# Patient Record
Sex: Male | Born: 1965 | Race: White | Hispanic: No | Marital: Married | Smoking: Never smoker
Health system: Southern US, Community
[De-identification: ages and names within clinical notes are randomized; demographics above are authoritative.]

## PROBLEM LIST (undated history)

## (undated) DIAGNOSIS — I509 Heart failure, unspecified: Secondary | ICD-10-CM

## (undated) DIAGNOSIS — I420 Dilated cardiomyopathy: Secondary | ICD-10-CM

## (undated) DIAGNOSIS — I272 Pulmonary hypertension, unspecified: Secondary | ICD-10-CM

## (undated) HISTORY — PX: BARIATRIC SURGERY: SHX1103

## (undated) HISTORY — DX: Pulmonary hypertension, unspecified: I27.20

## (undated) HISTORY — DX: Dilated cardiomyopathy: I42.0

## (undated) HISTORY — PX: UMBILICAL HERNIA REPAIR: SHX196

---

## 2015-10-02 DIAGNOSIS — G4733 Obstructive sleep apnea (adult) (pediatric): Secondary | ICD-10-CM | POA: Diagnosis not present

## 2015-11-01 DIAGNOSIS — G4733 Obstructive sleep apnea (adult) (pediatric): Secondary | ICD-10-CM | POA: Diagnosis not present

## 2015-11-13 DIAGNOSIS — K909 Intestinal malabsorption, unspecified: Secondary | ICD-10-CM | POA: Diagnosis not present

## 2015-11-25 DIAGNOSIS — Z903 Acquired absence of stomach [part of]: Secondary | ICD-10-CM | POA: Diagnosis not present

## 2015-12-02 DIAGNOSIS — G4733 Obstructive sleep apnea (adult) (pediatric): Secondary | ICD-10-CM | POA: Diagnosis not present

## 2016-01-01 DIAGNOSIS — G4733 Obstructive sleep apnea (adult) (pediatric): Secondary | ICD-10-CM | POA: Diagnosis not present

## 2016-01-26 DIAGNOSIS — N183 Chronic kidney disease, stage 3 (moderate): Secondary | ICD-10-CM | POA: Diagnosis not present

## 2016-01-26 DIAGNOSIS — K219 Gastro-esophageal reflux disease without esophagitis: Secondary | ICD-10-CM | POA: Diagnosis not present

## 2016-01-26 DIAGNOSIS — E1165 Type 2 diabetes mellitus with hyperglycemia: Secondary | ICD-10-CM | POA: Diagnosis not present

## 2016-01-26 DIAGNOSIS — I10 Essential (primary) hypertension: Secondary | ICD-10-CM | POA: Diagnosis not present

## 2016-02-01 DIAGNOSIS — G4733 Obstructive sleep apnea (adult) (pediatric): Secondary | ICD-10-CM | POA: Diagnosis not present

## 2016-02-12 DIAGNOSIS — Z23 Encounter for immunization: Secondary | ICD-10-CM | POA: Diagnosis not present

## 2016-02-12 DIAGNOSIS — N183 Chronic kidney disease, stage 3 (moderate): Secondary | ICD-10-CM | POA: Diagnosis not present

## 2016-02-12 DIAGNOSIS — L210 Seborrhea capitis: Secondary | ICD-10-CM | POA: Diagnosis not present

## 2016-02-12 DIAGNOSIS — G4733 Obstructive sleep apnea (adult) (pediatric): Secondary | ICD-10-CM | POA: Diagnosis not present

## 2016-03-03 DIAGNOSIS — G4733 Obstructive sleep apnea (adult) (pediatric): Secondary | ICD-10-CM | POA: Diagnosis not present

## 2016-03-14 DIAGNOSIS — G4733 Obstructive sleep apnea (adult) (pediatric): Secondary | ICD-10-CM | POA: Diagnosis not present

## 2016-04-06 DIAGNOSIS — I10 Essential (primary) hypertension: Secondary | ICD-10-CM | POA: Diagnosis not present

## 2016-04-06 DIAGNOSIS — I872 Venous insufficiency (chronic) (peripheral): Secondary | ICD-10-CM | POA: Diagnosis not present

## 2016-04-06 DIAGNOSIS — Z7982 Long term (current) use of aspirin: Secondary | ICD-10-CM | POA: Diagnosis not present

## 2016-04-06 DIAGNOSIS — S81831A Puncture wound without foreign body, right lower leg, initial encounter: Secondary | ICD-10-CM | POA: Diagnosis not present

## 2016-04-06 DIAGNOSIS — Z79899 Other long term (current) drug therapy: Secondary | ICD-10-CM | POA: Diagnosis not present

## 2016-05-03 DIAGNOSIS — G4733 Obstructive sleep apnea (adult) (pediatric): Secondary | ICD-10-CM | POA: Diagnosis not present

## 2016-09-13 DIAGNOSIS — Z0000 Encounter for general adult medical examination without abnormal findings: Secondary | ICD-10-CM | POA: Diagnosis not present

## 2016-09-16 DIAGNOSIS — G4733 Obstructive sleep apnea (adult) (pediatric): Secondary | ICD-10-CM | POA: Diagnosis not present

## 2016-09-16 DIAGNOSIS — L210 Seborrhea capitis: Secondary | ICD-10-CM | POA: Diagnosis not present

## 2016-09-16 DIAGNOSIS — Z0001 Encounter for general adult medical examination with abnormal findings: Secondary | ICD-10-CM | POA: Diagnosis not present

## 2016-09-16 DIAGNOSIS — N183 Chronic kidney disease, stage 3 (moderate): Secondary | ICD-10-CM | POA: Diagnosis not present

## 2017-05-19 DIAGNOSIS — N183 Chronic kidney disease, stage 3 (moderate): Secondary | ICD-10-CM | POA: Diagnosis not present

## 2017-05-19 DIAGNOSIS — R300 Dysuria: Secondary | ICD-10-CM | POA: Diagnosis not present

## 2017-05-19 DIAGNOSIS — L210 Seborrhea capitis: Secondary | ICD-10-CM | POA: Diagnosis not present

## 2017-05-19 DIAGNOSIS — G4733 Obstructive sleep apnea (adult) (pediatric): Secondary | ICD-10-CM | POA: Diagnosis not present

## 2017-05-19 DIAGNOSIS — L219 Seborrheic dermatitis, unspecified: Secondary | ICD-10-CM | POA: Diagnosis not present

## 2017-05-19 DIAGNOSIS — R0602 Shortness of breath: Secondary | ICD-10-CM | POA: Diagnosis not present

## 2017-05-19 DIAGNOSIS — Z23 Encounter for immunization: Secondary | ICD-10-CM | POA: Diagnosis not present

## 2017-06-01 DIAGNOSIS — N200 Calculus of kidney: Secondary | ICD-10-CM | POA: Diagnosis not present

## 2017-06-01 DIAGNOSIS — N390 Urinary tract infection, site not specified: Secondary | ICD-10-CM | POA: Diagnosis not present

## 2017-06-12 DIAGNOSIS — R5383 Other fatigue: Secondary | ICD-10-CM | POA: Diagnosis not present

## 2017-06-12 DIAGNOSIS — G4733 Obstructive sleep apnea (adult) (pediatric): Secondary | ICD-10-CM | POA: Diagnosis not present

## 2017-06-12 DIAGNOSIS — J029 Acute pharyngitis, unspecified: Secondary | ICD-10-CM | POA: Diagnosis not present

## 2017-06-12 DIAGNOSIS — Z6841 Body Mass Index (BMI) 40.0 and over, adult: Secondary | ICD-10-CM | POA: Diagnosis not present

## 2017-06-12 DIAGNOSIS — R197 Diarrhea, unspecified: Secondary | ICD-10-CM | POA: Diagnosis not present

## 2017-06-28 DIAGNOSIS — I10 Essential (primary) hypertension: Secondary | ICD-10-CM | POA: Diagnosis not present

## 2017-06-28 DIAGNOSIS — Z6841 Body Mass Index (BMI) 40.0 and over, adult: Secondary | ICD-10-CM | POA: Diagnosis not present

## 2017-06-28 DIAGNOSIS — G4733 Obstructive sleep apnea (adult) (pediatric): Secondary | ICD-10-CM | POA: Diagnosis not present

## 2017-06-28 DIAGNOSIS — F339 Major depressive disorder, recurrent, unspecified: Secondary | ICD-10-CM | POA: Diagnosis not present

## 2017-06-28 DIAGNOSIS — R05 Cough: Secondary | ICD-10-CM | POA: Diagnosis not present

## 2017-06-28 DIAGNOSIS — I714 Abdominal aortic aneurysm, without rupture: Secondary | ICD-10-CM | POA: Diagnosis not present

## 2017-06-28 DIAGNOSIS — R109 Unspecified abdominal pain: Secondary | ICD-10-CM | POA: Diagnosis not present

## 2017-06-28 DIAGNOSIS — I272 Pulmonary hypertension, unspecified: Secondary | ICD-10-CM | POA: Diagnosis not present

## 2017-06-28 DIAGNOSIS — I5031 Acute diastolic (congestive) heart failure: Secondary | ICD-10-CM | POA: Diagnosis not present

## 2017-06-28 DIAGNOSIS — R0602 Shortness of breath: Secondary | ICD-10-CM | POA: Diagnosis not present

## 2017-06-28 DIAGNOSIS — R7989 Other specified abnormal findings of blood chemistry: Secondary | ICD-10-CM | POA: Diagnosis not present

## 2017-06-28 DIAGNOSIS — Z9884 Bariatric surgery status: Secondary | ICD-10-CM | POA: Diagnosis not present

## 2017-06-28 DIAGNOSIS — Z87440 Personal history of urinary (tract) infections: Secondary | ICD-10-CM | POA: Diagnosis not present

## 2017-06-28 DIAGNOSIS — I5041 Acute combined systolic (congestive) and diastolic (congestive) heart failure: Secondary | ICD-10-CM | POA: Diagnosis not present

## 2017-06-28 DIAGNOSIS — I130 Hypertensive heart and chronic kidney disease with heart failure and stage 1 through stage 4 chronic kidney disease, or unspecified chronic kidney disease: Secondary | ICD-10-CM | POA: Diagnosis not present

## 2017-06-28 DIAGNOSIS — Z79899 Other long term (current) drug therapy: Secondary | ICD-10-CM | POA: Diagnosis not present

## 2017-06-28 DIAGNOSIS — N183 Chronic kidney disease, stage 3 (moderate): Secondary | ICD-10-CM | POA: Diagnosis not present

## 2017-06-28 DIAGNOSIS — R748 Abnormal levels of other serum enzymes: Secondary | ICD-10-CM | POA: Diagnosis not present

## 2017-06-28 DIAGNOSIS — E876 Hypokalemia: Secondary | ICD-10-CM | POA: Diagnosis not present

## 2017-06-28 DIAGNOSIS — Z833 Family history of diabetes mellitus: Secondary | ICD-10-CM | POA: Diagnosis not present

## 2017-06-29 DIAGNOSIS — I5031 Acute diastolic (congestive) heart failure: Secondary | ICD-10-CM | POA: Diagnosis not present

## 2017-06-29 DIAGNOSIS — R0602 Shortness of breath: Secondary | ICD-10-CM | POA: Diagnosis not present

## 2017-06-30 DIAGNOSIS — I5031 Acute diastolic (congestive) heart failure: Secondary | ICD-10-CM | POA: Diagnosis not present

## 2017-07-01 DIAGNOSIS — I5031 Acute diastolic (congestive) heart failure: Secondary | ICD-10-CM | POA: Diagnosis not present

## 2017-07-03 DIAGNOSIS — E782 Mixed hyperlipidemia: Secondary | ICD-10-CM | POA: Diagnosis not present

## 2017-07-03 DIAGNOSIS — I10 Essential (primary) hypertension: Secondary | ICD-10-CM | POA: Diagnosis not present

## 2017-07-03 DIAGNOSIS — I5042 Chronic combined systolic (congestive) and diastolic (congestive) heart failure: Secondary | ICD-10-CM | POA: Diagnosis not present

## 2017-07-12 DIAGNOSIS — N183 Chronic kidney disease, stage 3 (moderate): Secondary | ICD-10-CM | POA: Diagnosis not present

## 2017-07-12 DIAGNOSIS — E1165 Type 2 diabetes mellitus with hyperglycemia: Secondary | ICD-10-CM | POA: Diagnosis not present

## 2017-07-14 DIAGNOSIS — G4761 Periodic limb movement disorder: Secondary | ICD-10-CM | POA: Diagnosis not present

## 2017-07-14 DIAGNOSIS — G4733 Obstructive sleep apnea (adult) (pediatric): Secondary | ICD-10-CM | POA: Diagnosis not present

## 2017-07-25 ENCOUNTER — Encounter: Payer: Self-pay | Admitting: *Deleted

## 2017-07-25 ENCOUNTER — Encounter: Payer: Self-pay | Admitting: Cardiology

## 2017-07-25 ENCOUNTER — Ambulatory Visit (INDEPENDENT_AMBULATORY_CARE_PROVIDER_SITE_OTHER): Payer: BLUE CROSS/BLUE SHIELD | Admitting: Cardiology

## 2017-07-25 VITALS — BP 116/82 | HR 79 | Ht 74.0 in | Wt 380.4 lb

## 2017-07-25 DIAGNOSIS — I5022 Chronic systolic (congestive) heart failure: Secondary | ICD-10-CM | POA: Diagnosis not present

## 2017-07-25 DIAGNOSIS — I272 Pulmonary hypertension, unspecified: Secondary | ICD-10-CM

## 2017-07-25 DIAGNOSIS — N183 Chronic kidney disease, stage 3 unspecified: Secondary | ICD-10-CM

## 2017-07-25 MED ORDER — SACUBITRIL-VALSARTAN 24-26 MG PO TABS: 1.0000 | ORAL_TABLET | Freq: Two times a day (BID) | ORAL | 3 refills | Status: DC

## 2017-07-25 NOTE — Patient Instructions (Signed)
Your physician recommends that you schedule a follow-up appointment in: 2-3 WEEKS WITH DR [facility]  Your physician has recommended you make the following change in your medication:   STOP LISINOPRIL   AFTER 48 HOURS START ENTRESTO 24/26 MG TWICE DAILY  Thank you for choosing [facility]!!

## 2017-07-25 NOTE — H&P (View-Only) (Signed)
Clinical Summary [name] is a 52 y.o.male seen as new consult, referred by Dr Reuel Boom for new diagnosis of systolic heart failure.   ..  1. Chronic systolic HF - Previously seen by Dr Molly Maduro 3 years ago prior to bariatric surgery. He reports an echo at that time that he believes was normal, was cleared for surgery.  - Jan 2019 admission to [facility] with CHF exacerbation. LVEF at that time 20-25% which appears to be new diagnosis at that time.  - admitted with weight up from 395 to 419, SOB, orthopnea.   - no recent SOB/DOE - home weights stable 370 lbs. Limiting sodium intake. Avoiding NSAIDs - strong family history of CAD: mother, uncle, multiple cousins. - recent viral illness around same time of diagnosis of his HF.    2. Pulmonary HTN - Jan 2019 echo with PASP 57, evidence of right sided enlargement and dysfunction - VQ scan during recent [facility] admission low probability.  - history of OSA on cpap. LIkely left sided cardiac component.   3. CKD 3 - followed by pcp - Cr during recent admission 2.8, patient reports labs since discharge.   4. OSA - uses CPAP   5. AAA - noted in discharge in summary, we do not have the details - appears patient had abdominal US done during admission    Past Medical History:  Diagnosis Date  . Dilated cardiomyopathy (HCC)   . Pulmonary hypertension (HCC)      Allergies not on file   Current Outpatient Medications  Medication Sig Dispense Refill  . aspirin 81 MG tablet Take by mouth.    . furosemide (LASIX) 40 MG tablet Take by mouth.    Marland Kitchen lisinopril (PRINIVIL,ZESTRIL) 20 MG tablet Take by mouth.    . metoprolol succinate (TOPROL XL) 25 MG 24 hr tablet Take by mouth.    . sertraline (ZOLOFT) 100 MG tablet Take by mouth.     No current facility-administered medications for this visit.         Allergies not on file    Family History  Problem Relation Age of Onset  . Hypertension Mother   . Lung cancer Mother   .  Heart failure Mother   . Diabetes type II Father         Social History Mr. Beedle reports that  has never smoked. he has never used smokeless tobacco. Mr. Jaco has no alcohol history on file.   Review of Systems CONSTITUTIONAL: No weight loss, fever, chills, weakness or fatigue.  HEENT: Eyes: No visual loss, blurred vision, double vision or yellow sclerae.No hearing loss, sneezing, congestion, runny nose or sore throat.  SKIN: No rash or itching.  CARDIOVASCULAR: per hpi RESPIRATORY: Nper hpi GASTROINTESTINAL: No anorexia, nausea, vomiting or diarrhea. No abdominal pain or blood.  GENITOURINARY: No burning on urination, no polyuria NEUROLOGICAL: No headache, dizziness, syncope, paralysis, ataxia, numbness or tingling in the extremities. No change in bowel or bladder control.  MUSCULOSKELETAL: No muscle, back pain, joint pain or stiffness.  LYMPHATICS: No enlarged nodes. No history of splenectomy.  PSYCHIATRIC: No history of depression or anxiety.  ENDOCRINOLOGIC: No reports of sweating, cold or heat intolerance. No polyuria or polydipsia.  Marland Kitchen   Physical Examination Vitals:   07/25/17 0826  BP: 116/82  Pulse: 79  SpO2: 98%   Vitals:   07/25/17 0826  Weight: (!) 380 lb 6.4 oz (172.5 kg)  Height: 6\' 2"  (1.88 m)    Gen: resting comfortably, no  acute distress HEENT: no scleral icterus, pupils equal round and reactive, no palptable cervical adenopathy,  CV: RRR, no m/r/g, no jvd Resp: Clear to auscultation bilaterally GI: abdomen is soft, non-tender, non-distended, normal bowel sounds, no hepatosplenomegaly MSK: extremities are warm, no edema.  Skin: warm, no rash Neuro:  no focal deficits Psych: appropriate affect    Assessment and Plan  1. Chronic systolic HF - new diagnosis last month - appears euvolemic today, no symptoms - we will d/c lisinopril, wait 48 hrs then start entresto 24/26mg  bid - will need ischemic evaluation at some point, renal function may  limit options. Request most recent labs from pcp to see where his function currently is  2. Pulmonary HTN - likely due to left sided cardiac disease - from recent admission VQ was negative. He does have history of OSA could play some role. Perhaps some obesity hypovent though this has not been formally diagnosed - ideally LHC/RHC pending renal function  3. CKD 3 - f/u post discharge labs from pcp - limited dosing of entresto, no aldactone for now.   F/u 2-3 weeks       Antoine Poche, M.D.

## 2017-07-25 NOTE — Progress Notes (Signed)
Clinical Summary [name] is a 52 y.o.male seen as new consult, referred by Dr Mitchell Jennings for new diagnosis of systolic heart failure.   ..  1. Chronic systolic HF - Previously seen by Dr Mitchell Maduro 3 years ago prior to bariatric surgery. He reports an echo at that time that he believes was normal, was cleared for surgery.  - Jan 2019 admission to [facility] with CHF exacerbation. LVEF at that time 20-25% which appears to be new diagnosis at that time.  - admitted with weight up from 395 to 419, SOB, orthopnea.   - no recent SOB/DOE - home weights stable 370 lbs. Limiting sodium intake. Avoiding NSAIDs - strong family history of CAD: mother, uncle, multiple cousins. - recent viral illness around same time of diagnosis of his HF.    2. Pulmonary HTN - Jan 2019 echo with PASP 57, evidence of right sided enlargement and dysfunction - VQ scan during recent [facility] admission low probability.  - history of OSA on cpap. LIkely left sided cardiac component.   3. CKD 3 - followed by pcp - Cr during recent admission 2.8, patient reports labs since discharge.   4. OSA - uses CPAP   5. AAA - noted in discharge in summary, we do not have the details - appears patient had abdominal US done during admission    Past Medical History:  Diagnosis Date  . Dilated cardiomyopathy (HCC)   . Pulmonary hypertension (HCC)      Allergies not on file   Current Outpatient Medications  Medication Sig Dispense Refill  . aspirin 81 MG tablet Take by mouth.    . furosemide (LASIX) 40 MG tablet Take by mouth.    Marland Kitchen lisinopril (PRINIVIL,ZESTRIL) 20 MG tablet Take by mouth.    . metoprolol succinate (TOPROL XL) 25 MG 24 hr tablet Take by mouth.    . sertraline (ZOLOFT) 100 MG tablet Take by mouth.     No current facility-administered medications for this visit.         Allergies not on file    Family History  Problem Relation Age of Onset  . Hypertension Mother   . Lung cancer Mother   .  Heart failure Mother   . Diabetes type II Father         Social History [name] reports that  has never smoked. he has never used smokeless tobacco. [name] has no alcohol history on file.   Review of Systems CONSTITUTIONAL: No weight loss, fever, chills, weakness or fatigue.  HEENT: Eyes: No visual loss, blurred vision, double vision or yellow sclerae.No hearing loss, sneezing, congestion, runny nose or sore throat.  SKIN: No rash or itching.  CARDIOVASCULAR: per hpi RESPIRATORY: Nper hpi GASTROINTESTINAL: No anorexia, nausea, vomiting or diarrhea. No abdominal pain or blood.  GENITOURINARY: No burning on urination, no polyuria NEUROLOGICAL: No headache, dizziness, syncope, paralysis, ataxia, numbness or tingling in the extremities. No change in bowel or bladder control.  MUSCULOSKELETAL: No muscle, back pain, joint pain or stiffness.  LYMPHATICS: No enlarged nodes. No history of splenectomy.  PSYCHIATRIC: No history of depression or anxiety.  ENDOCRINOLOGIC: No reports of sweating, cold or heat intolerance. No polyuria or polydipsia.  Marland Kitchen   Physical Examination Vitals:   07/25/17 0826  BP: 116/82  Pulse: 79  SpO2: 98%   Vitals:   07/25/17 0826  Weight: (!) 380 lb 6.4 oz (172.5 kg)  Height: 6\' 2"  (1.88 m)    Gen: resting comfortably, no  acute distress HEENT: no scleral icterus, pupils equal round and reactive, no palptable cervical adenopathy,  CV: RRR, no m/r/g, no jvd Resp: Clear to auscultation bilaterally GI: abdomen is soft, non-tender, non-distended, normal bowel sounds, no hepatosplenomegaly MSK: extremities are warm, no edema.  Skin: warm, no rash Neuro:  no focal deficits Psych: appropriate affect    Assessment and Plan  1. Chronic systolic HF - new diagnosis last month - appears euvolemic today, no symptoms - we will d/c lisinopril, wait 48 hrs then start entresto 24/26mg  bid - will need ischemic evaluation at some point, renal function may  limit options. Request most recent labs from pcp to see where his function currently is  2. Pulmonary HTN - likely due to left sided cardiac disease - from recent admission VQ was negative. He does have history of OSA could play some role. Perhaps some obesity hypovent though this has not been formally diagnosed - ideally LHC/RHC pending renal function  3. CKD 3 - f/u post discharge labs from pcp - limited dosing of entresto, no aldactone for now.   F/u 2-3 weeks       Antoine Poche, M.D.

## 2017-07-27 ENCOUNTER — Encounter: Payer: Self-pay | Admitting: *Deleted

## 2017-07-27 ENCOUNTER — Telehealth: Payer: Self-pay | Admitting: *Deleted

## 2017-07-27 NOTE — Telephone Encounter (Signed)
-----   Message from Antoine Poche, MD sent at 07/26/2017 12:38 PM EST ----- Can we let patient know that we received his recent labs from pcp and kidneys have improved to a level where we can do the cath we discussed. Please arrange a RHC/LHC for systolic heart failure   Dina Rich MD

## 2017-07-27 NOTE — Telephone Encounter (Signed)
L and R heart cath scheduled for 2/25 with Dr End @2pm  - pt voiced understanding of instructions - will also come by office tomorrow to pick up instruction letter- message sent to schedulers for pre cert

## 2017-07-28 ENCOUNTER — Telehealth: Payer: Self-pay | Admitting: Cardiology

## 2017-07-28 NOTE — Telephone Encounter (Signed)
Pre-cert Verification for the following procedure   L/R [facility] scheduled for Monday 2/25 with Dr End @ 2pm

## 2017-07-31 ENCOUNTER — Ambulatory Visit (HOSPITAL_COMMUNITY)
Admission: RE | Admit: 2017-07-31 | Discharge: 2017-07-31 | Disposition: A | Discharge status: Home / Self Care | Payer: BLUE CROSS/BLUE SHIELD | Source: Ambulatory Visit | Attending: Internal Medicine | Admitting: Internal Medicine

## 2017-07-31 ENCOUNTER — Encounter (HOSPITAL_COMMUNITY)
Admission: RE | Disposition: A | Discharge status: Home / Self Care | Payer: Self-pay | Source: Ambulatory Visit | Attending: Internal Medicine

## 2017-07-31 DIAGNOSIS — Z7982 Long term (current) use of aspirin: Secondary | ICD-10-CM | POA: Insufficient documentation

## 2017-07-31 DIAGNOSIS — I5020 Unspecified systolic (congestive) heart failure: Secondary | ICD-10-CM | POA: Diagnosis present

## 2017-07-31 DIAGNOSIS — N183 Chronic kidney disease, stage 3 (moderate): Secondary | ICD-10-CM | POA: Insufficient documentation

## 2017-07-31 DIAGNOSIS — Z6841 Body Mass Index (BMI) 40.0 and over, adult: Secondary | ICD-10-CM | POA: Diagnosis not present

## 2017-07-31 DIAGNOSIS — I714 Abdominal aortic aneurysm, without rupture: Secondary | ICD-10-CM | POA: Diagnosis not present

## 2017-07-31 DIAGNOSIS — I272 Pulmonary hypertension, unspecified: Secondary | ICD-10-CM | POA: Insufficient documentation

## 2017-07-31 DIAGNOSIS — I5022 Chronic systolic (congestive) heart failure: Secondary | ICD-10-CM | POA: Insufficient documentation

## 2017-07-31 DIAGNOSIS — I420 Dilated cardiomyopathy: Secondary | ICD-10-CM | POA: Insufficient documentation

## 2017-07-31 DIAGNOSIS — G4733 Obstructive sleep apnea (adult) (pediatric): Secondary | ICD-10-CM | POA: Diagnosis not present

## 2017-07-31 DIAGNOSIS — Z8249 Family history of ischemic heart disease and other diseases of the circulatory system: Secondary | ICD-10-CM | POA: Insufficient documentation

## 2017-07-31 HISTORY — PX: RIGHT/LEFT HEART CATH AND CORONARY ANGIOGRAPHY: CATH118266

## 2017-07-31 LAB — BASIC METABOLIC PANEL
ANION GAP: 8 (ref 5–15)
BUN: 19 mg/dL (ref 6–20)
CHLORIDE: 106 mmol/L (ref 101–111)
CO2: 27 mmol/L (ref 22–32)
Calcium: 8.6 mg/dL — ABNORMAL LOW (ref 8.9–10.3)
Creatinine, Ser: 1.55 mg/dL — ABNORMAL HIGH (ref 0.61–1.24)
GFR calc Af Amer: 59 mL/min — ABNORMAL LOW (ref >60)
GFR calc non Af Amer: 51 mL/min — ABNORMAL LOW (ref >60)
Glucose, Bld: 97 mg/dL (ref 65–99)
POTASSIUM: 3.7 mmol/L (ref 3.5–5.1)
Sodium: 141 mmol/L (ref 135–145)

## 2017-07-31 LAB — CBC
HCT: 48.4 % (ref 39.0–52.0)
HEMOGLOBIN: 15.7 g/dL (ref 13.0–17.0)
MCH: 28.9 pg (ref 26.0–34.0)
MCHC: 32.4 g/dL (ref 30.0–36.0)
MCV: 89 fL (ref 78.0–100.0)
PLATELETS: 167 10*3/uL (ref 150–400)
RBC: 5.44 MIL/uL (ref 4.22–5.81)
RDW: 14.9 % (ref 11.5–15.5)
WBC: 5.8 10*3/uL (ref 4.0–10.5)

## 2017-07-31 LAB — PROTIME-INR
INR: 1.06
PROTHROMBIN TIME: 13.7 s (ref 11.4–15.2)

## 2017-07-31 LAB — POCT I-STAT 3, VENOUS BLOOD GAS (G3P V)
ACID-BASE EXCESS: 1 mmol/L (ref 0.0–2.0)
Acid-Base Excess: 1 mmol/L (ref 0.0–2.0)
BICARBONATE: 26.7 mmol/L (ref 20.0–28.0)
BICARBONATE: 26.9 mmol/L (ref 20.0–28.0)
O2 SAT: 61 %
O2 SAT: 62 %
PCO2 VEN: 47.3 mmHg (ref 44.0–60.0)
PCO2 VEN: 47.7 mmHg (ref 44.0–60.0)
PO2 VEN: 34 mmHg (ref 32.0–45.0)
PO2 VEN: 34 mmHg (ref 32.0–45.0)
TCO2: 28 mmol/L (ref 22–32)
TCO2: 28 mmol/L (ref 22–32)
pH, Ven: 7.359 (ref 7.250–7.430)
pH, Ven: 7.359 (ref 7.250–7.430)

## 2017-07-31 LAB — POCT I-STAT 3, ART BLOOD GAS (G3+)
BICARBONATE: 24.5 mmol/L (ref 20.0–28.0)
O2 SAT: 97 %
TCO2: 26 mmol/L (ref 22–32)
pCO2 arterial: 39.6 mmHg (ref 32.0–48.0)
pH, Arterial: 7.399 (ref 7.350–7.450)
pO2, Arterial: 95 mmHg (ref 83.0–108.0)

## 2017-07-31 SURGERY — RIGHT/LEFT HEART CATH AND CORONARY ANGIOGRAPHY
Anesthesia: LOCAL

## 2017-07-31 MED ORDER — HEPARIN (PORCINE) IN NACL 2-0.9 UNIT/ML-% IJ SOLN
INTRAMUSCULAR | Status: AC | PRN
  Administered 2017-07-31 (×2): 500 mL

## 2017-07-31 MED ORDER — LIDOCAINE HCL (PF) 1 % IJ SOLN
INTRAMUSCULAR | Status: DC | PRN
  Administered 2017-07-31 (×2): 2 mL

## 2017-07-31 MED ORDER — HEPARIN SODIUM (PORCINE) 1000 UNIT/ML IJ SOLN
INTRAMUSCULAR | Status: AC
  Filled 2017-07-31: qty 1

## 2017-07-31 MED ORDER — VERAPAMIL HCL 2.5 MG/ML IV SOLN
INTRAVENOUS | Status: AC
  Filled 2017-07-31: qty 2

## 2017-07-31 MED ORDER — SODIUM CHLORIDE 0.9 % IV SOLN: 250.0000 mL | INTRAVENOUS | Status: DC | PRN

## 2017-07-31 MED ORDER — SODIUM CHLORIDE 0.9% FLUSH: 3.0000 mL | INTRAVENOUS | Status: DC | PRN

## 2017-07-31 MED ORDER — SODIUM CHLORIDE 0.9 % WEIGHT BASED INFUSION: 1.0000 mL/kg/h | INTRAVENOUS | Status: DC

## 2017-07-31 MED ORDER — SODIUM CHLORIDE 0.9% FLUSH: 3.0000 mL | Freq: Two times a day (BID) | INTRAVENOUS | Status: DC

## 2017-07-31 MED ORDER — SODIUM CHLORIDE 0.9 % WEIGHT BASED INFUSION
3.0000 mL/kg/h | INTRAVENOUS | Status: AC
  Administered 2017-07-31: 3 mL/kg/h via INTRAVENOUS

## 2017-07-31 MED ORDER — ASPIRIN 81 MG PO CHEW: 81.0000 mg | CHEWABLE_TABLET | ORAL | Status: DC

## 2017-07-31 MED ORDER — FUROSEMIDE 40 MG PO TABS: 80.0000 mg | ORAL_TABLET | Freq: Two times a day (BID) | ORAL | Status: DC

## 2017-07-31 MED ORDER — HEPARIN (PORCINE) IN NACL 2-0.9 UNIT/ML-% IJ SOLN
INTRAMUSCULAR | Status: AC
  Filled 2017-07-31: qty 1000

## 2017-07-31 MED ORDER — HEPARIN SODIUM (PORCINE) 1000 UNIT/ML IJ SOLN
INTRAMUSCULAR | Status: DC | PRN
  Administered 2017-07-31: 5000 [IU] via INTRAVENOUS

## 2017-07-31 MED ORDER — IOPAMIDOL (ISOVUE-370) INJECTION 76%
INTRAVENOUS | Status: AC
  Filled 2017-07-31: qty 100

## 2017-07-31 MED ORDER — LIDOCAINE HCL 1 % IJ SOLN
INTRAMUSCULAR | Status: AC
  Filled 2017-07-31: qty 20

## 2017-07-31 MED ORDER — IOPAMIDOL (ISOVUE-370) INJECTION 76%
INTRAVENOUS | Status: DC | PRN
  Administered 2017-07-31: 50 mL via INTRAVENOUS

## 2017-07-31 SURGICAL SUPPLY — 14 items
CATH BALLN WEDGE 5F 110CM (CATHETERS) ×2 IMPLANT
CATH IMPULSE 5F ANG/FL3.5 (CATHETERS) ×2 IMPLANT
COVER PRB 48X5XTLSCP FOLD TPE (BAG) ×1 IMPLANT
COVER PROBE 5X48 (BAG) ×1
DEVICE RAD COMP TR BAND LRG (VASCULAR PRODUCTS) ×2 IMPLANT
GLIDESHEATH SLEND SS 6F .021 (SHEATH) ×2 IMPLANT
GUIDEWIRE INQWIRE 1.5J.035X260 (WIRE) ×1 IMPLANT
HOVERMATT SINGLE USE (MISCELLANEOUS) ×2 IMPLANT
INQWIRE 1.5J .035X260CM (WIRE) ×2
KIT HEART LEFT (KITS) ×2 IMPLANT
PACK CARDIAC CATHETERIZATION (CUSTOM PROCEDURE TRAY) ×2 IMPLANT
SHEATH GLIDE SLENDER 4/5FR (SHEATH) ×2 IMPLANT
TRANSDUCER W/STOPCOCK (MISCELLANEOUS) ×2 IMPLANT
TUBING CIL FLEX 10 FLL-RA (TUBING) ×2 IMPLANT

## 2017-07-31 NOTE — Interval H&P Note (Signed)
History and Physical Interval Note:  07/31/2017 1:05 PM  Mitchell Jennings Mitchell Jennings.  has presented today for cardiac catheterization, with the diagnosis of systolic heart failure.  The various methods of treatment have been discussed with the patient and family. After consideration of risks, benefits and other options for treatment, the patient has consented to  Procedure(s): RIGHT/LEFT HEART CATH AND CORONARY ANGIOGRAPHY (N/A) as a surgical intervention .  The patient's history has been reviewed, patient examined, no change in status, stable for surgery.  I have reviewed the patient's chart and labs.  Questions were answered to the patient's satisfaction.    Cath Lab Visit (complete for each Cath Lab visit)  Clinical Evaluation Leading to the Procedure:   ACS: No.  Non-ACS:    Anginal Classification: No Symptoms (NYHA class III heart failure)  Anti-ischemic medical therapy: Minimal Therapy (1 class of medications)  Non-Invasive Test Results: No non-invasive testing performed (severely reduced LVEF by echo)  Prior CABG: No previous CABG  Mitchell Jennings

## 2017-07-31 NOTE — Brief Op Note (Signed)
BRIEF CARDIAC CATHETERIZATION NOTE  DATE: 07/31/2017 TIME: 2:06 PM  PATIENT:  Mitchell Jennings.  52 y.o. male  PRE-OPERATIVE DIAGNOSIS:  Chronic systolic heart failure  POST-OPERATIVE DIAGNOSIS:  Same  PROCEDURE:  Procedure(s): RIGHT/LEFT HEART CATH AND CORONARY ANGIOGRAPHY (N/A)  SURGEON:  Surgeon(s) and Role:    Yvonne Kendall, MD - Primary  FINDING: 1. No angiographically significant coronary artery disease. 2. Moderately to severely elevated left heart filling pressures. 3. Mildly to moderately elevated right heart filling pressures. 4. Severe pulmonary hypertension. 5. Low Fick cardiac output/index. 6. Small right radial artery; right ulnar access utilized for the procedure.  RECOMMENDATIONS: 1. Increase furosemide to 80 mg BID; follow-up labs with Dr. Wyline Mood in ~1 week. 2. Optimize evidence based heart failure therapy for non-ischemic cardiomyopathy.  Yvonne Kendall, MD [facility] Pager: [phone]

## 2017-07-31 NOTE — Progress Notes (Signed)
Right brachial sheath removed without difficulty removed intact and no bleeding or hematoma

## 2017-07-31 NOTE — Discharge Instructions (Signed)
Radial Site Care Refer to this sheet in the next few weeks. These instructions provide you with information about caring for yourself after your procedure. Your health care provider may also give you more specific instructions. Your treatment has been planned according to current medical practices, but problems sometimes occur. Call your health care provider if you have any problems or questions after your procedure. What can I expect after the procedure? After your procedure, it is typical to have the following:  Bruising at the radial site that usually fades within 1-2 weeks.  Blood collecting in the tissue (hematoma) that may be painful to the touch. It should usually decrease in size and tenderness within 1-2 weeks.  Follow these instructions at home:  Take medicines only as directed by your health care provider.  You may shower 24-48 hours after the procedure or as directed by your health care provider. Remove the bandage (dressing) and gently wash the site with plain soap and water. Pat the area dry with a clean towel. Do not rub the site, because this may cause bleeding.  Do not take baths, swim, or use a hot tub until your health care provider approves.  Check your insertion site every day for redness, swelling, or drainage.  Do not apply powder or lotion to the site.  Do not flex or bend the affected arm for 24 hours or as directed by your health care provider.  Do not push or pull heavy objects with the affected arm for 24 hours or as directed by your health care provider.  Do not lift over 10 lb (4.5 kg) for 5 days after your procedure or as directed by your health care provider.  Ask your health care provider when it is okay to: ? Return to work or school. ? Resume usual physical activities or sports. ? Resume sexual activity.  Do not drive home if you are discharged the same day as the procedure. Have someone else drive you.  You may drive 24 hours after the procedure  unless otherwise instructed by your health care provider.  Do not operate machinery or power tools for 24 hours after the procedure.  If your procedure was done as an outpatient procedure, which means that you went home the same day as your procedure, a responsible adult should be with you for the first 24 hours after you arrive home.  Keep all follow-up visits as directed by your health care provider. This is important. Contact a health care provider if:  You have a fever.  You have chills.  You have increased bleeding from the radial site. Hold pressure on the site. Get help right away if:  You have unusual pain at the radial site.  You have redness, warmth, or swelling at the radial site.  You have drainage (other than a small amount of blood on the dressing) from the radial site.  The radial site is bleeding, and the bleeding does not stop after 30 minutes of holding steady pressure on the site.  Your arm or hand becomes pale, cool, tingly, or numb. This information is not intended to replace advice given to you by your health care provider. Make sure you discuss any questions you have with your health care provider. Document Released: 06/25/2010 Document Revised: 10/29/2015 Document Reviewed: 12/09/2013 Elsevier Interactive Patient Education  2018 [employer]. Excuse from Work, [employer], or Physical Activity ______________________Charles Hopper_________________________________ needs to be excused from: __x__ Work ____ [employer] ____ Physical activity beginning now and through the  following date: _3/4/2019_______________. He or she may return to work or school but should still avoid the following physical activity or activities from now until ________________. Activity restrictions include: ___ Lifting more than ______ lb ____ Sitting longer than __________ minutes at a time ____ Standing longer than ________ minutes at a time ____ He or she may return to full physical  activity as of ________________. Health Care Provider Name (printed): __Dr Cristal Deer End______________________________________ [facility] Provider (signature): ___________________________________________ Date: ____2/25/2019____________   This information is not intended to replace advice given to you by your health care provider. Make sure you discuss any questions you have with your health care provider. Document Released: 11/16/2000 Document Revised: 05/06/2016 Document Reviewed: 12/23/2013 Elsevier Interactive Patient Education  [employer].

## 2017-08-01 ENCOUNTER — Encounter (HOSPITAL_COMMUNITY): Payer: Self-pay | Admitting: Internal Medicine

## 2017-08-01 MED FILL — Verapamil HCl IV Soln 2.5 MG/ML: INTRAVENOUS | Qty: 2 | Status: AC

## 2017-08-01 MED FILL — Heparin Sodium (Porcine) 2 Unit/ML in Sodium Chloride 0.9%: INTRAMUSCULAR | Qty: 1000 | Status: AC

## 2017-08-01 MED FILL — Lidocaine HCl Local Inj 1%: INTRAMUSCULAR | Qty: 20 | Status: AC

## 2017-08-02 ENCOUNTER — Telehealth: Payer: Self-pay | Admitting: *Deleted

## 2017-08-02 DIAGNOSIS — I5022 Chronic systolic (congestive) heart failure: Secondary | ICD-10-CM

## 2017-08-02 NOTE — Telephone Encounter (Signed)
Mitchell Poche, MD  Mitchell Jennings, Mitchell Jennings, CMA        Can we get a BMET/Mg on Friday for this patient and also refer to CHF clinic for chronic systolic HF please.    Dina Rich MD    Pt aware and will come tomorrow to pick up lab orders and referral for CHF clinic. Says he will arrange to have labs done at [facility] or [facility] on Friday

## 2017-08-03 ENCOUNTER — Other Ambulatory Visit: Payer: Self-pay | Admitting: *Deleted

## 2017-08-03 DIAGNOSIS — I5022 Chronic systolic (congestive) heart failure: Secondary | ICD-10-CM

## 2017-08-04 DIAGNOSIS — I5022 Chronic systolic (congestive) heart failure: Secondary | ICD-10-CM | POA: Diagnosis not present

## 2017-08-09 ENCOUNTER — Encounter: Payer: Self-pay | Admitting: *Deleted

## 2017-08-09 ENCOUNTER — Other Ambulatory Visit: Payer: Self-pay | Admitting: *Deleted

## 2017-08-09 ENCOUNTER — Telehealth (HOSPITAL_COMMUNITY): Payer: Self-pay | Admitting: Vascular Surgery

## 2017-08-09 DIAGNOSIS — I5022 Chronic systolic (congestive) heart failure: Secondary | ICD-10-CM

## 2017-08-09 NOTE — Telephone Encounter (Signed)
Left pt message to make next ava new pt appt for CHF

## 2017-08-15 ENCOUNTER — Encounter: Payer: Self-pay | Admitting: Cardiology

## 2017-08-15 ENCOUNTER — Ambulatory Visit (INDEPENDENT_AMBULATORY_CARE_PROVIDER_SITE_OTHER): Payer: BLUE CROSS/BLUE SHIELD | Admitting: Cardiology

## 2017-08-15 VITALS — BP 121/88 | HR 79 | Ht 74.0 in | Wt 381.0 lb

## 2017-08-15 DIAGNOSIS — I5022 Chronic systolic (congestive) heart failure: Secondary | ICD-10-CM

## 2017-08-15 DIAGNOSIS — N183 Chronic kidney disease, stage 3 unspecified: Secondary | ICD-10-CM

## 2017-08-15 DIAGNOSIS — G4733 Obstructive sleep apnea (adult) (pediatric): Secondary | ICD-10-CM | POA: Diagnosis not present

## 2017-08-15 DIAGNOSIS — I272 Pulmonary hypertension, unspecified: Secondary | ICD-10-CM | POA: Diagnosis not present

## 2017-08-15 MED ORDER — METOPROLOL SUCCINATE ER 25 MG PO TB24: 25.0000 mg | ORAL_TABLET | Freq: Every day | ORAL | 3 refills | Status: DC

## 2017-08-15 MED ORDER — TORSEMIDE 20 MG PO TABS: 40.0000 mg | ORAL_TABLET | Freq: Two times a day (BID) | ORAL | 3 refills | Status: DC

## 2017-08-15 NOTE — Patient Instructions (Addendum)
Medication Instructions:  STOP LASIX  START TORSEMIDE 40 MG - TWO TIMES DAILY  INCREASE METOPROLOL TO 25 MG DAILY    Labwork: 1 WEEK BMET TSH MAGNESIUM  Testing/Procedures: NONE  Follow-Up: Your physician recommends that you schedule a follow-up appointment : AS NEEDED   Any Other Special Instructions Will Be Listed Below (If Applicable). You have been referred to CARDIAC REHAB       If you need a refill on your cardiac medications before your next appointment, please call your pharmacy.

## 2017-08-15 NOTE — Progress Notes (Signed)
Clinical Summary [name] is a 52 y.o.male seen today for follow up of the followin gmedical problems.   1. Chronic systolic HF - Previously seen by Dr Mitchell Jennings 3 years ago prior to bariatric surgery. He reports an echo at that time that he believes was normal, was cleared for surgery.  - Jan 2019 admission to [facility] with CHF exacerbation. LVEF at that time 20-25% which appears to be new diagnosis.  - admitted with weight up from 395 to 419, SOB, orthopnea. Diuresed with improved symptoms - reports viral illness around same time of diagnosis of his HF.    - cath 07/2017 no significant CAD. CI 1.73, Mean PA 51, PW mean 30, LVEDP 30 - after cath lasix increased to 80mg  bid - we also referred to CHF clinic due to his severe CHF, low CI, and young age. - clinic weights stable at 381 despite increased lasix.  - home weights 367-370 lbs.     2. Pulmonary HTN - Jan 2019 echo with PASP 57, evidence of right sided enlargement and dysfunction - 07/2017 RHC mean PA 51, PCWP 31, mixed precap and postcap pulm HTN.  - VQ scan during recent [facility] admission low probability.  - history of OSA on cpap.  - probable left sided heart diseese and OSA as etiology.   - no recent symptoms  3. CKD 3 - followed by pcp - AKI during prior admission Cr up to 2.8, has trended down.   4. OSA - uses CPAP  - followed by Dr Mitchell Jennings  Reports recent sleep study at [facility]. We have requested results.    5. AAA - Jan 2019 [facility] Abd [place]: 3.4 cm AAA - no symptoms     Past Medical History:  Diagnosis Date  . Dilated cardiomyopathy (HCC)   . Pulmonary hypertension (HCC)      No Known Allergies   Current Outpatient Medications  Medication Sig Dispense Refill  . furosemide (LASIX) 40 MG tablet Take 2 tablets (80 mg total) by mouth 2 (two) times daily. 30 tablet   . metoprolol succinate (TOPROL XL) 25 MG 24 hr tablet Take 12.5 mg by mouth daily.     . Multiple Vitamins-Minerals (CENTRUM  MEN) TABS Take 1 tablet by mouth daily.    . sacubitril-valsartan (ENTRESTO) 24-26 MG Take 1 tablet by mouth 2 (two) times daily. 60 tablet 3   No current facility-administered medications for this visit.      Past Surgical History:  Procedure Laterality Date  . BARIATRIC SURGERY    . RIGHT/LEFT HEART CATH AND CORONARY ANGIOGRAPHY N/A 07/31/2017   Procedure: RIGHT/LEFT HEART CATH AND CORONARY ANGIOGRAPHY;  Surgeon: Mitchell Kendall, MD;  Location: [facility] INVASIVE CV LAB;  Service: Cardiovascular;  Laterality: N/A;  . UMBILICAL HERNIA REPAIR       No Known Allergies    Family History  Problem Relation Age of Onset  . Hypertension Mother   . Lung cancer Mother   . Heart failure Mother   . Diabetes type II Father      Social History [name] reports that  has never smoked. he has never used smokeless tobacco. [name] has no alcohol history on file.   Review of Systems CONSTITUTIONAL: No weight loss, fever, chills, weakness or fatigue.  HEENT: Eyes: No visual loss, blurred vision, double vision or yellow sclerae.No hearing loss, sneezing, congestion, runny nose or sore throat.  SKIN: No rash or itching.  CARDIOVASCULAR: per hpi RESPIRATORY: No shortness of  breath, cough or sputum.  GASTROINTESTINAL: No anorexia, nausea, vomiting or diarrhea. No abdominal pain or blood.  GENITOURINARY: No burning on urination, no polyuria NEUROLOGICAL: No headache, dizziness, syncope, paralysis, ataxia, numbness or tingling in the extremities. No change in bowel or bladder control.  MUSCULOSKELETAL: No muscle, back pain, joint pain or stiffness.  LYMPHATICS: No enlarged nodes. No history of splenectomy.  PSYCHIATRIC: No history of depression or anxiety.  ENDOCRINOLOGIC: No reports of sweating, cold or heat intolerance. No polyuria or polydipsia.  Marland Kitchen   Physical Examination Vitals:   08/15/17 1347  BP: 121/88  Pulse: 79  SpO2: 98%   Vitals:   08/15/17 1347  Weight: (!) 381 lb (172.8  kg)  Height: 6\' 2"  (1.88 m)    Gen: resting comfortably, no acute distress HEENT: no scleral icterus, pupils equal round and reactive, no palptable cervical adenopathy,  CV: RRR, no m/r/g, no jvd Resp: Clear to auscultation bilaterally GI: abdomen is soft, non-tender, non-distended, normal bowel sounds, no hepatosplenomegaly MSK: extremities are warm, no edema.  Skin: warm, no rash Neuro:  no focal deficits Psych: appropriate affect   Diagnostic Studies  07/2017 echo Conclusions: 1. No angiographically significant coronary artery disease, consistent with non-ischemic cardiomyopathy. 2. Moderately elevated right heart filling pressures. 3. Moderately to severely elevated left heart filling pressures. 4. Severe pulmonary hypertension. 5. Low Fick cardiac output/index.  Recommendations: 1. Increase furosemide to 80 mg BID with repeat BMP at the end of this week or early next week, given chronic kidney disease. 2. Close outpatient follow-up for optimization of evidence-based heart failure therapy.   Assessment and Plan   1. Chronic systolic HF/NICM - recent diagnosis. Cath shows no significant CAD. Did have CI of 1.7, high filling pressures PCWP 30 - careful titration of beta blocker in setting of low CI - lasix was increased after recent cath with elevated filling pressures however weights remains stable, does not appear to have had significant diuresis. Exam limited by body habitus.  - we will stop lasix, start torsemide 40mg  bid. Increase Toprol XL to 25mg  daily - check BMET/Mg/TSH in 1 week.  - refer to cardiac rehab in [place]  - due to his severe LV dysfunction, low CI, and young age patient will be referred to CHF clinic. He will f/u with [place] as needed or once discharged from CHF clinic.   2. Pulmonary HTN - mixed pre and post capillary hemodynamic profile by recent RHC - likely due to left sided cardiac disease, OSA. Suspect obesity hypovent though this has not been  formally diagnosed - conitnue management of left heart disease, continue CPAP.   3. CKD 3 - monitor renal function closely with diuresis   4. OSA - followed by pcp per his report, we have requestsed sleep study results. If severe may warrant establishing him with specialist in sleep medicine given his CHF and pulm HTN.         Antoine Poche, M.D

## 2017-08-18 ENCOUNTER — Encounter: Payer: Self-pay | Admitting: Cardiology

## 2017-08-22 DIAGNOSIS — I5022 Chronic systolic (congestive) heart failure: Secondary | ICD-10-CM | POA: Diagnosis not present

## 2017-08-22 DIAGNOSIS — Z9189 Other specified personal risk factors, not elsewhere classified: Secondary | ICD-10-CM | POA: Diagnosis not present

## 2017-08-22 DIAGNOSIS — K219 Gastro-esophageal reflux disease without esophagitis: Secondary | ICD-10-CM | POA: Diagnosis not present

## 2017-08-22 DIAGNOSIS — E782 Mixed hyperlipidemia: Secondary | ICD-10-CM | POA: Diagnosis not present

## 2017-08-22 DIAGNOSIS — I10 Essential (primary) hypertension: Secondary | ICD-10-CM | POA: Diagnosis not present

## 2017-08-22 DIAGNOSIS — E1165 Type 2 diabetes mellitus with hyperglycemia: Secondary | ICD-10-CM | POA: Diagnosis not present

## 2017-08-23 ENCOUNTER — Encounter (HOSPITAL_COMMUNITY): Payer: Self-pay | Admitting: Cardiology

## 2017-08-23 ENCOUNTER — Ambulatory Visit (HOSPITAL_COMMUNITY)
Admission: RE | Admit: 2017-08-23 | Discharge: 2017-08-23 | Disposition: A | Discharge status: Home / Self Care | Payer: BLUE CROSS/BLUE SHIELD | Source: Ambulatory Visit | Attending: Cardiology | Admitting: Cardiology

## 2017-08-23 VITALS — BP 97/74 | HR 76 | Ht 74.0 in | Wt 381.8 lb

## 2017-08-23 DIAGNOSIS — Z9884 Bariatric surgery status: Secondary | ICD-10-CM | POA: Insufficient documentation

## 2017-08-23 DIAGNOSIS — I714 Abdominal aortic aneurysm, without rupture: Secondary | ICD-10-CM | POA: Insufficient documentation

## 2017-08-23 DIAGNOSIS — N183 Chronic kidney disease, stage 3 unspecified: Secondary | ICD-10-CM

## 2017-08-23 DIAGNOSIS — E669 Obesity, unspecified: Secondary | ICD-10-CM | POA: Insufficient documentation

## 2017-08-23 DIAGNOSIS — Z6841 Body Mass Index (BMI) 40.0 and over, adult: Secondary | ICD-10-CM | POA: Diagnosis not present

## 2017-08-23 DIAGNOSIS — Z79899 Other long term (current) drug therapy: Secondary | ICD-10-CM | POA: Insufficient documentation

## 2017-08-23 DIAGNOSIS — I272 Pulmonary hypertension, unspecified: Secondary | ICD-10-CM | POA: Insufficient documentation

## 2017-08-23 DIAGNOSIS — I5022 Chronic systolic (congestive) heart failure: Secondary | ICD-10-CM | POA: Diagnosis not present

## 2017-08-23 DIAGNOSIS — G4733 Obstructive sleep apnea (adult) (pediatric): Secondary | ICD-10-CM | POA: Insufficient documentation

## 2017-08-23 DIAGNOSIS — I428 Other cardiomyopathies: Secondary | ICD-10-CM | POA: Insufficient documentation

## 2017-08-23 MED ORDER — SPIRONOLACTONE 25 MG PO TABS: 12.5000 mg | ORAL_TABLET | Freq: Every day | ORAL | 3 refills | Status: DC

## 2017-08-23 NOTE — Patient Instructions (Signed)
Start Spironolactone 12.5 mg (1/2 tab) every night   Labs drawn today (if we do not call you, then your lab work was stable)   Your physician has recommended that you have a cardiopulmonary stress test (CPX). CPX testing is a non-invasive measurement of heart and lung function. It replaces a traditional treadmill stress test. This type of test provides a tremendous amount of information that relates not only to your present condition but also for future outcomes. This test combines measurements of you ventilation, respiratory gas exchange in the lungs, electrocardiogram (EKG), blood pressure and physical response before, during, and following an exercise protocol.  Your physician recommends that you schedule a follow-up appointment in: 2 weeks with Fara Chute D   Your physician recommends that you schedule a follow-up appointment in: 6-8 weeks with Dr. Shirlee Latch

## 2017-08-23 NOTE — Progress Notes (Signed)
PCP: Dr. Reuel Boom Cardiology: Dr. Wyline Mood HF Cardiology: Dr. Shirlee Latch  52 yo with history of nonischemic cardiomyopathy was referred by Dr. Wyline Mood for evaluation of CHF.  Patient had no cardiac history prior to 1/19.  However, starting at about 11/18, he began to develop profound exertional dyspnea.  This happened after a flu-like illness.  This was gradually progressive, and he ended up at [facility] in 1/19.  Echo showed EF 20-25% with some RV failure.  He was diuresed extensively and sent to followup with Dr. Wyline Mood.  In 2/19, he had a right heart cath showing no significant coronary disease but elevated left and right-sided filling pressures and pulmonary venous hypertension were noted on RHC.  Cardiac index was low.    Recently, he says that he has been feeling much better.   He is urinating more with torsemide.  He can walk up 2 flights of steps now at work with only mild dyspnea.  No dyspnea walking on flat ground though he tires walking long distances.   Able to throw soft ball with daughter.  No chest pain, lightheadedness, orthopnea/PND.  Rare palpitations.   Labs (2/19): creatinine 1.55 Labs (3/19): TSH normal, K 3.6, creatinine 1.7  PMH: 1. Obesity: h/o bariatric surgery (gastric sleeve).  2. OSA: Uses CPAP.  3. CKD: stage 3.  4. AAA: Abdominal US in 3/19 showed 3.4 cm AAA.  5. Chronic systolic CHF: Nonischemic cardiomyopathy.  First noted in 1/19.  - Echo (1/19, [facility]): EF 20-25%, moderate to severe LV dilation, moderate to severe RV dilation with moderately decreased RV systolic function, mild to moderate MR, PASP 57 mmHg.  - RHC/LHC (1/19): No significant CAD.  Mean RA 12, PA 80/30 mean 47, mean PCWP 35, CI 1.7, PVR 2.4 WU.   FH: Father with MI, uncle with CABG, uncle with MI, mother with MI and CHF.   SH: Married, lives in [place], works for a trucking company, no smoking, no ETOH/drugs.   ROS: All systems reviewed and negative except as per HPI.   Current Outpatient  Medications  Medication Sig Dispense Refill  . metoprolol succinate (TOPROL XL) 25 MG 24 hr tablet Take 1 tablet (25 mg total) by mouth daily. 90 tablet 3  . Multiple Vitamins-Minerals (CENTRUM MEN) TABS Take 1 tablet by mouth daily.    . sacubitril-valsartan (ENTRESTO) 24-26 MG Take 1 tablet by mouth 2 (two) times daily. 60 tablet 3  . torsemide (DEMADEX) 20 MG tablet Take 2 tablets (40 mg total) by mouth 2 (two) times daily. 360 tablet 3  . spironolactone (ALDACTONE) 25 MG tablet Take 0.5 tablets (12.5 mg total) by mouth daily. 15 tablet 3   No current facility-administered medications for this encounter.    BP 97/74   Pulse 76   Ht 6\' 2"  (1.88 m)   Wt (!) 381 lb 12.8 oz (173.2 kg)   SpO2 100%   BMI 49.02 kg/m  General: NAD, obese.  Neck: Thick, no JVD, no thyromegaly or thyroid nodule.  Lungs: Clear to auscultation bilaterally with normal respiratory effort. CV: Nondisplaced PMI.  Heart regular S1/S2, no S3/S4, no murmur.  No peripheral edema.  No carotid bruit.  Normal pedal pulses.  Abdomen: Soft, nontender, no hepatosplenomegaly, no distention.  Skin: Intact without lesions or rashes.  Neurologic: Alert and oriented x 3.  Psych: Normal affect. Extremities: No clubbing or cyanosis.  HEENT: Normal.   1. Chronic systolic CHF: Nonischemic cardiomyopathy.  Development of cardiomyopathy seemed to have occurred after a viral illness,  possibly he had viral myocarditis.  Interestingly, he stopped taking his selenium vitamin for 1 year prior to 1/19 (supposed to take post-bariatric surgery).  He has now restarted it.  There is some data on selenium deficiency and cardiomyopathy.  RHC in 2/19 showed very high filling pressures and low cardiac output.  However, symptomatically today the patient seems to be doing quite well overall. NYHA class II symptoms.  He does not appear volume overloaded on exam.   - Continue Toprol XL 25 mg daily and Entresto 24/26 bid.  - BP soft (90s here, he says it  runs 110s systolic at home), so not a lot of room for medication titration.  I will, however, add on spironolactone 12.5 daily.  BMET in 10 days.  - He has restarted selenium. - I will arrange for CPX.  - Repeat echo in 7/19 for ICD consideration.  - ECG today.  - Probably will not be able to fit in MRI machine for cardiac MRI.  2. H/o bariatric surgery: Gastric sleeve.  Stopped selenium for 1 year.  ?if selenium deficiency could have contributed to CMP.  He has restarted it.  He needs to continue to work on weight loss.  3. OSA: Continue CPAP.  4. CKD: Stage 3.  Follow creatinine closely with medication titration.  5. Pulmonary hypertension: RHC showed pulmonary venous hypertension that will be treated by diuresis.  No indication for selective pulmonary vasodilators.   Followup in HF pharmacy clinic for medication titration in 2 wks.  See me in 6 wks.   Mitchell Jennings 08/24/2017

## 2017-08-24 ENCOUNTER — Telehealth: Payer: Self-pay | Admitting: *Deleted

## 2017-08-24 NOTE — Telephone Encounter (Signed)
Pt aware and voiced understanding - routed to pcp  

## 2017-08-24 NOTE — Telephone Encounter (Signed)
-----   Message from Antoine Poche, MD sent at 08/24/2017 12:49 PM EDT ----- Labs show kidney function remains mildly decreased but within his recent range. I see that Dr Jearld Pies ordered some labs within the next few days, we will see what those results show, for now no changes.  Dominga Ferry MD

## 2017-09-06 ENCOUNTER — Ambulatory Visit (HOSPITAL_COMMUNITY): Payer: BLUE CROSS/BLUE SHIELD

## 2017-09-06 ENCOUNTER — Ambulatory Visit (HOSPITAL_COMMUNITY)
Admission: RE | Admit: 2017-09-06 | Discharge: 2017-09-06 | Disposition: A | Discharge status: Home / Self Care | Payer: BLUE CROSS/BLUE SHIELD | Source: Ambulatory Visit | Attending: Internal Medicine | Admitting: Internal Medicine

## 2017-09-06 ENCOUNTER — Other Ambulatory Visit (HOSPITAL_COMMUNITY): Payer: Self-pay | Admitting: *Deleted

## 2017-09-06 VITALS — BP 112/86 | HR 68 | Wt 382.8 lb

## 2017-09-06 DIAGNOSIS — I429 Cardiomyopathy, unspecified: Secondary | ICD-10-CM | POA: Diagnosis not present

## 2017-09-06 DIAGNOSIS — G4733 Obstructive sleep apnea (adult) (pediatric): Secondary | ICD-10-CM | POA: Diagnosis not present

## 2017-09-06 DIAGNOSIS — Z9884 Bariatric surgery status: Secondary | ICD-10-CM | POA: Diagnosis not present

## 2017-09-06 DIAGNOSIS — N183 Chronic kidney disease, stage 3 (moderate): Secondary | ICD-10-CM | POA: Insufficient documentation

## 2017-09-06 DIAGNOSIS — I272 Pulmonary hypertension, unspecified: Secondary | ICD-10-CM | POA: Insufficient documentation

## 2017-09-06 DIAGNOSIS — I5022 Chronic systolic (congestive) heart failure: Secondary | ICD-10-CM | POA: Insufficient documentation

## 2017-09-06 LAB — BASIC METABOLIC PANEL
ANION GAP: 11 (ref 5–15)
BUN: 23 mg/dL — AB (ref 6–20)
CALCIUM: 8.7 mg/dL — AB (ref 8.9–10.3)
CO2: 28 mmol/L (ref 22–32)
Chloride: 101 mmol/L (ref 101–111)
Creatinine, Ser: 1.73 mg/dL — ABNORMAL HIGH (ref 0.61–1.24)
GFR calc Af Amer: 52 mL/min — ABNORMAL LOW (ref >60)
GFR calc non Af Amer: 45 mL/min — ABNORMAL LOW (ref >60)
GLUCOSE: 86 mg/dL (ref 65–99)
Potassium: 3.8 mmol/L (ref 3.5–5.1)
Sodium: 140 mmol/L (ref 135–145)

## 2017-09-06 MED ORDER — SACUBITRIL-VALSARTAN 49-51 MG PO TABS: 1.0000 | ORAL_TABLET | Freq: Two times a day (BID) | ORAL | 5 refills | Status: DC

## 2017-09-06 NOTE — Progress Notes (Signed)
HF MD: Loralie Champagne  HPI:  52 yo Caucasian M with history of nonischemic cardiomyopathy was referred by Dr. Harl Bowie for evaluation of CHF.  Patient had no cardiac history prior to 1/19.  However, starting at about 11/18, he began to develop profound exertional dyspnea.  This happened after a flu-like illness.  This was gradually progressive, and he ended up at [facility] in 1/19.  Echo showed EF 20-25% with some RV failure.  He was diuresed extensively and sent to followup with Dr. Harl Bowie.  In 2/19, he had a right heart cath showing no significant coronary disease but elevated left and right-sided filling pressures and pulmonary venous hypertension were noted on RHC.  Cardiac index was low.    Patient returns today for pharmacist-led HF medication titration. At his last HF clinic visit, he was started on spironolactone 12.5 mg daily. He has felt well since initiation of spironolactone. No dyspnea walking on flat ground though he tires walking long distances. No chest pain, lightheadedness, orthopnea/PND.      Marland Kitchen Shortness of breath/dyspnea on exertion? no  . Orthopnea/PND? no . Edema? no . Lightheadedness/dizziness? no . Daily weights at home? Yes - stable ~371-373 lb  . Blood pressure/heart rate monitoring at home? Yes - 95-110/75-80 mmHg . Following low-sodium/fluid-restricted diet? Yes   HF Medications: Metoprolol succinate 25 mg PO daily Entresto 24-26 mg PO BID Spironolactone 12.5 mg PO daily Torsemide 40 mg PO BID  Has the patient been experiencing any side effects to the medications prescribed?  no  Does the patient have any problems obtaining medications due to transportation or finances?   No - [employer] commercial (has met deductible so meds are no charge)  Understanding of regimen: good Understanding of indications: good Potential of compliance: good Patient understands to avoid NSAIDs. Patient understands to avoid decongestants.    Pertinent Lab Values: . 09/06/2017:  Serum creatinine 1.73 (1.5-1.7), BUN 23, Potassium 3.8, Sodium 140, Selenium IP  Vital Signs: . Weight: 382.81 lb (dry weight: 381 lb) . Blood pressure: 112/86 mmHg . Heart rate: 68 bpm    Assessment: 1. Chronicsystolic CHF (EF 69-62%), due to NICM. NYHA class IIsymptoms. - Volume status stable - Cautiously increase Entresto to 49-51 mg BID with recent soft BP - Continue Toprol XL 25 mg daily, spironolactone 12.5 mg daily and torsemide 40 mg BID - Repeat echo in 7/19 for ICD consideration.  - Probably will not be able to fit in MRI machine for cardiac MRI.  - Basic disease state pathophysiology, medication indication, mechanism and side effects reviewed at length with patient and he verbalized understanding 2. H/o bariatric surgery: Gastric sleeve.   - Stopped selenium for 1 year.  ?if selenium deficiency could have contributed to CMP - Selenium level today  - He needs to continue to work on weight loss.  3. OSA: Continue CPAP.  4. CKD: Stage 3.  Follow creatinine closely with medication titration.  - SCr remains at BL (1.5-1.7) 5. Pulmonary hypertension: RHC showed pulmonary venous hypertension that will be treated by diuresis.  No indication for selective pulmonary vasodilators.    Plan: 1) Medication changes: Based on clinical presentation, vital signs and recent labs will increase Entresto to 49-51 mg BID 2) Labs: BMET + Selenium today  3) Follow-up: Pharmacy visit on 09/21/17 and Dr. Aundra Dubin 10/18/17   Ruta Hinds. Velva Harman, PharmD, BCPS, CPP Clinical Pharmacist Pager: [phone] Phone: [phone] 09/06/2017 10:21 AM

## 2017-09-06 NOTE — Patient Instructions (Signed)
It was great to see you today!  Please INCREASE your Entresto to 49-51 mg BID.   Blood work today. We will call you with any changes.   You are scheduled with the pharmacist, Cicero Duck, again on 09/21/17 and with Dr. Shirlee Latch on 10/18/17.

## 2017-09-07 LAB — SELENIUM SERUM: Selenium: 144 ug/L (ref 91–198)

## 2017-09-09 ENCOUNTER — Telehealth: Payer: Self-pay | Admitting: Cardiology

## 2017-09-09 NOTE — Telephone Encounter (Signed)
Paged by answering service, returned call at given number. Avalon Zubia was seen on 4/3 by Elizabeth Palau and had his Entresto uptitrated at that time. He has had a total of 5 doses of the higher dose of entresto (49-51mg ) and has tolerated it well. Today he noted that he has felt somewhat more fatigued, and his wife noted that he has been more irritable. He denies lightheadedness, postural symptoms, weight gain or loss. No other new symptoms. His initial blood pressure was 92/68, but a recheck shortly after was 106/64. Per Erika's noted, his range of BP is typically 95-110/75-80. Mr. Beranek endorses this as his usual range.  Given that his blood pressure is within his normal range, he otherwise feels well, and he does not note lightheadedness, presyncope, vision changes, or orthostatic symptoms, recommended that he continue on this Entresto dose. If his blood pressure falls below his typical range, especially if it is below 90 systolic, recommend cutting back to his prior dose of entresto if asymptomatic or holding the dose if he is symptomatic. If he experience low blood pressure or symptoms, instructed to call back for additional guidance. Patient understands and agrees with the plan.  Jodelle Red, MD, PhD, overnight cardiology provider

## 2017-09-15 DIAGNOSIS — Z0001 Encounter for general adult medical examination with abnormal findings: Secondary | ICD-10-CM | POA: Diagnosis not present

## 2017-09-18 DIAGNOSIS — Z0001 Encounter for general adult medical examination with abnormal findings: Secondary | ICD-10-CM | POA: Diagnosis not present

## 2017-09-18 DIAGNOSIS — I10 Essential (primary) hypertension: Secondary | ICD-10-CM | POA: Diagnosis not present

## 2017-09-18 DIAGNOSIS — I5042 Chronic combined systolic (congestive) and diastolic (congestive) heart failure: Secondary | ICD-10-CM | POA: Diagnosis not present

## 2017-09-18 DIAGNOSIS — E782 Mixed hyperlipidemia: Secondary | ICD-10-CM | POA: Diagnosis not present

## 2017-09-21 ENCOUNTER — Ambulatory Visit (HOSPITAL_COMMUNITY)
Admission: RE | Admit: 2017-09-21 | Discharge: 2017-09-21 | Disposition: A | Discharge status: Home / Self Care | Payer: BLUE CROSS/BLUE SHIELD | Source: Ambulatory Visit | Attending: Internal Medicine | Admitting: Internal Medicine

## 2017-09-21 DIAGNOSIS — Z79899 Other long term (current) drug therapy: Secondary | ICD-10-CM | POA: Diagnosis not present

## 2017-09-21 DIAGNOSIS — N183 Chronic kidney disease, stage 3 (moderate): Secondary | ICD-10-CM | POA: Diagnosis not present

## 2017-09-21 DIAGNOSIS — I428 Other cardiomyopathies: Secondary | ICD-10-CM | POA: Diagnosis present

## 2017-09-21 DIAGNOSIS — Z9884 Bariatric surgery status: Secondary | ICD-10-CM | POA: Diagnosis not present

## 2017-09-21 DIAGNOSIS — I272 Pulmonary hypertension, unspecified: Secondary | ICD-10-CM | POA: Diagnosis not present

## 2017-09-21 DIAGNOSIS — G4733 Obstructive sleep apnea (adult) (pediatric): Secondary | ICD-10-CM | POA: Diagnosis not present

## 2017-09-21 DIAGNOSIS — I5022 Chronic systolic (congestive) heart failure: Secondary | ICD-10-CM | POA: Diagnosis not present

## 2017-09-21 MED ORDER — METOPROLOL SUCCINATE ER 25 MG PO TB24: 25.0000 mg | ORAL_TABLET | Freq: Every day | ORAL | 3 refills | Status: DC

## 2017-09-21 MED ORDER — SPIRONOLACTONE 25 MG PO TABS: 12.5000 mg | ORAL_TABLET | Freq: Every day | ORAL | 3 refills | Status: DC

## 2017-09-21 NOTE — Patient Instructions (Addendum)
It was great to see you today!  Please continue your current medications.   Please keep your appointment with Dr. Shirlee Latch on 10/18/17.

## 2017-09-21 NOTE — Progress Notes (Signed)
HF MD: Mitchell Jennings  HPI:  52 yo Caucasian M with history of nonischemic cardiomyopathy was referred by Dr. Harl Bowie for evaluation of CHF. Patient had no cardiac history prior to 1/19. However, starting at about 11/18, he began to develop profound exertional dyspnea. This happened after a flu-like illness. This was gradually progressive, and he ended up at [facility] in 1/19. Echo showed EF 20-25% with some RV failure. He was diuresed extensively and sent to followup with Dr. Harl Bowie. In 2/19, he had a right heart cath showing no significant coronary disease but elevated left and right-sided filling pressures and pulmonary venous hypertension were noted on RHC. Cardiac index was low.   Patient returns today with his wife for pharmacist-led HF medication titration. At his last HF pharmacist visit on 4/3, his [name] was increased to 49-51 mg BID. He has felt well since his [name] was increased.He felt very tired and aggravated one day last week but his wife states that he did not sleep well the night before. He has felt much better since then. No dyspnea walking on flat ground though he tires walking long distances.No chest pain, lightheadedness, orthopnea/PND. He did see his PCP, Dr. Olena Heckle, last Friday at [facility] who drew a BMET and lipid panel. Will call to ask them to fax results here.    Shortness of breath/dyspnea on exertion? no   Orthopnea/PND? no  Edema? no  Lightheadedness/dizziness? no  Daily weights at home? Yes - stable ~371-373 lb   Blood pressure/heart rate monitoring at home? Yes - 95-110/75-80 mmHg  Following low-sodium/fluid-restricted diet? Yes   HF Medications: Metoprolol succinate 25 mg PO daily Entresto 49-51 mg PO BID Spironolactone 12.5 mg PO daily Torsemide 40 mg PO BID  Has the patient been experiencing any side effects to the medications prescribed?  no  Does the patient have any problems obtaining medications due  to transportation or finances?   No - [employer] commercial (has met deductible so meds are no charge)  Understanding of regimen: good Understanding of indications: good Potential of compliance: good Patient understands to avoid NSAIDs. Patient understands to avoid decongestants.   Pertinent Lab Values:  09/15/2017 PCP OFFICE: Serum creatinine 1.67 (1.5-1.7), BUN 28, Potassium 3.4, Sodium 144, LP: LDL 149, TG 217   09/06/17: Selenium 144 (wnl)  Vital Signs:  Weight: 390 lb (dry weight: 381-382 lb)  Blood pressure: 108/72 mmHg  Heart rate: 62 bpm    Assessment: 1. Chronicsystolic CHF (EF 38-18%), due to NICM. NYHA class IIsymptoms. - Volume status stable despite increase in weight today (likely diet related) - With soft BP and HR, will continue current regimen including Entresto 49-51 mg BID, Toprol XL 25 mg daily, spironolactone 12.5 mg daily and torsemide 40 mg BID - Repeat echo in 7/19 for ICD consideration.  - Probably will not be able to fit in MRI machine for cardiac MRI - Basic disease state pathophysiology, medication indication, mechanism and side effects reviewed at length with patient and he verbalized understanding 2. H/o bariatric surgery: Gastric sleeve.  - Stopped selenium for 1 year. ?if selenium deficiency could have contributed to CMP - Selenium level wnl on 09/06/17 - He needs to continue to work on weight loss.  3. OSA: Continue CPAP.  4. CKD: Stage 3. Follow creatinine closely with medication titration.  - SCr remains at BL (1.5-1.7) 5. Pulmonary hypertension: RHC showed pulmonary venous hypertension that will be treated by diuresis. No indication for selective pulmonary vasodilators.    Plan:  1) Medication changes: Based on clinical presentation, vital signs and recent labs will continue current regimen 2) Labs: BMET/LP from PCP last Friday (09/15/17) 3) Follow-up: Dr. Aundra Dubin 10/18/17   Ruta Hinds. Velva Harman, PharmD, BCPS, CPP Clinical  Pharmacist Pager: [phone] Phone: [phone] 09/06/2017 10:21 AM

## 2017-09-26 ENCOUNTER — Other Ambulatory Visit (HOSPITAL_COMMUNITY): Payer: Self-pay | Admitting: Cardiology

## 2017-09-27 ENCOUNTER — Telehealth (HOSPITAL_COMMUNITY): Payer: Self-pay | Admitting: Pharmacist

## 2017-09-27 MED ORDER — ROSUVASTATIN CALCIUM 5 MG PO TABS: 5.0000 mg | ORAL_TABLET | Freq: Every day | ORAL | 5 refills | Status: DC

## 2017-09-27 NOTE — Telephone Encounter (Signed)
Received lipid panel from patient's PCP who wanted Dr. Alford Highland input on initiation of statin therapy. He has NICM, not diabetic and not hypertensive. His LDL was 149, TG 217. His 10 year ASCVD risk is 4% so based on ACC recommendations he doesn't meet criteria for a statin but with his weight and borderline elevated LDL/TG and per discussion with Dr. Shirlee Latch, will initiate low dose Crestor 5 mg daily. Relayed this info to [name] who agreed with plan.   Mitchell Jennings. Bonnye Fava, PharmD, BCPS, CPP Clinical Pharmacist Phone: [phone] 09/27/2017 11:55 AM

## 2017-10-18 ENCOUNTER — Ambulatory Visit (HOSPITAL_COMMUNITY)
Admission: RE | Admit: 2017-10-18 | Discharge: 2017-10-18 | Disposition: A | Discharge status: Home / Self Care | Payer: BLUE CROSS/BLUE SHIELD | Source: Ambulatory Visit | Attending: Cardiology | Admitting: Cardiology

## 2017-10-18 ENCOUNTER — Other Ambulatory Visit (HOSPITAL_COMMUNITY): Payer: Self-pay

## 2017-10-18 ENCOUNTER — Encounter (HOSPITAL_COMMUNITY): Payer: Self-pay | Admitting: Cardiology

## 2017-10-18 VITALS — BP 123/72 | HR 68 | Wt 394.2 lb

## 2017-10-18 DIAGNOSIS — I429 Cardiomyopathy, unspecified: Secondary | ICD-10-CM | POA: Insufficient documentation

## 2017-10-18 DIAGNOSIS — I714 Abdominal aortic aneurysm, without rupture: Secondary | ICD-10-CM | POA: Insufficient documentation

## 2017-10-18 DIAGNOSIS — I272 Pulmonary hypertension, unspecified: Secondary | ICD-10-CM | POA: Insufficient documentation

## 2017-10-18 DIAGNOSIS — Z9884 Bariatric surgery status: Secondary | ICD-10-CM | POA: Diagnosis not present

## 2017-10-18 DIAGNOSIS — Z8249 Family history of ischemic heart disease and other diseases of the circulatory system: Secondary | ICD-10-CM | POA: Diagnosis not present

## 2017-10-18 DIAGNOSIS — Z79899 Other long term (current) drug therapy: Secondary | ICD-10-CM | POA: Insufficient documentation

## 2017-10-18 DIAGNOSIS — N183 Chronic kidney disease, stage 3 unspecified: Secondary | ICD-10-CM

## 2017-10-18 DIAGNOSIS — E669 Obesity, unspecified: Secondary | ICD-10-CM | POA: Diagnosis not present

## 2017-10-18 DIAGNOSIS — I10 Essential (primary) hypertension: Secondary | ICD-10-CM | POA: Diagnosis not present

## 2017-10-18 DIAGNOSIS — Z9189 Other specified personal risk factors, not elsewhere classified: Secondary | ICD-10-CM | POA: Diagnosis not present

## 2017-10-18 DIAGNOSIS — I5042 Chronic combined systolic (congestive) and diastolic (congestive) heart failure: Secondary | ICD-10-CM | POA: Diagnosis not present

## 2017-10-18 DIAGNOSIS — G4733 Obstructive sleep apnea (adult) (pediatric): Secondary | ICD-10-CM | POA: Diagnosis not present

## 2017-10-18 DIAGNOSIS — I5022 Chronic systolic (congestive) heart failure: Secondary | ICD-10-CM | POA: Insufficient documentation

## 2017-10-18 LAB — BASIC METABOLIC PANEL
ANION GAP: 9 (ref 5–15)
BUN: 31 mg/dL — ABNORMAL HIGH (ref 6–20)
CALCIUM: 8.8 mg/dL — AB (ref 8.9–10.3)
CO2: 32 mmol/L (ref 22–32)
CREATININE: 1.68 mg/dL — AB (ref 0.61–1.24)
Chloride: 104 mmol/L (ref 101–111)
GFR, EST AFRICAN AMERICAN: 54 mL/min — AB (ref >60)
GFR, EST NON AFRICAN AMERICAN: 46 mL/min — AB (ref >60)
Glucose, Bld: 139 mg/dL — ABNORMAL HIGH (ref 65–99)
Potassium: 3.6 mmol/L (ref 3.5–5.1)
SODIUM: 145 mmol/L (ref 135–145)

## 2017-10-18 MED ORDER — SPIRONOLACTONE 25 MG PO TABS: 25.0000 mg | ORAL_TABLET | Freq: Every day | ORAL | 0 refills | Status: DC

## 2017-10-18 MED ORDER — METOPROLOL SUCCINATE ER 25 MG PO TB24: 25.0000 mg | ORAL_TABLET | Freq: Every day | ORAL | 3 refills | Status: DC

## 2017-10-18 NOTE — Patient Instructions (Signed)
Increase Spironolactone 25 mg (1 tab) daily  Increase Toprol XL 50 mg (1 tab) daily  Your physician has requested that you have an echocardiogram. Echocardiography is a painless test that uses sound waves to create images of your heart. It provides your doctor with information about the size and shape of your heart and how well your heart's chambers and valves are working. This procedure takes approximately one hour. There are no restrictions for this procedure.  Labs drawn today (if we do not call you, then your lab work was stable)   Your physician recommends that you return for lab work in: 10 days   Your physician recommends that you schedule a follow-up appointment in: 2 months with Dr. Shirlee Latch  an a echocardiogram

## 2017-10-22 NOTE — Progress Notes (Signed)
PCP: Dr. Reuel Boom Cardiology: Dr. Wyline Mood HF Cardiology: Dr. Shirlee Latch  52 yo with history of nonischemic cardiomyopathy was referred by Dr. Wyline Mood for evaluation of CHF.  Patient had no cardiac history prior to 1/19.  However, starting at about 11/18, he began to develop profound exertional dyspnea.  This happened after a flu-like illness.  This was gradually progressive, and he ended up at [facility] in 1/19.  Echo showed EF 20-25% with some RV failure.  He was diuresed extensively and sent to followup with Dr. Wyline Mood.  In 2/19, he had a right heart cath showing no significant coronary disease but elevated left and right-sided filling pressures and pulmonary venous hypertension were noted on RHC.  Cardiac index was low.    Patient returns for followup of CHF. His weight is up but says he has not been watching his diet.  No dyspnea walking on flat ground.  No orthopnea/PND.  No chest pain.  CPX in 4/19 was limited mainly by body habitus, no significant CHF limitation.  BP stable.  He is now on Crestor with high LDL.   ECG (personally reviewed): NSR, PVC, IVCD 126 msec  Labs (2/19): creatinine 1.55 Labs (3/19): TSH normal, K 3.6, creatinine 1.7 Labs (4/19): K 3.4, creatinine 1.67, LDL 149, selenium 144 (normal)   PMH: 1. Obesity: h/o bariatric surgery (gastric sleeve).  2. OSA: Uses CPAP.  3. CKD: stage 3.  4. AAA: Abdominal US in 3/19 showed 3.4 cm AAA.  5. Chronic systolic CHF: Nonischemic cardiomyopathy.  First noted in 1/19.  - Echo (1/19, [facility]): EF 20-25%, moderate to severe LV dilation, moderate to severe RV dilation with moderately decreased RV systolic function, mild to moderate MR, PASP 57 mmHg.  - RHC/LHC (1/19): No significant CAD.  Mean RA 12, PA 80/30 mean 47, mean PCWP 35, CI 1.7, PVR 2.4 WU.  - CPX (4/19): peak VO2 16.7, VE/VCO2 slope 26, RER 1.14  FH: Father with MI, uncle with CABG, uncle with MI, mother with MI and CHF.   SH: Married, lives in [place], works for a  trucking company, no smoking, no ETOH/drugs.   ROS: All systems reviewed and negative except as per HPI.   Current Outpatient Medications  Medication Sig Dispense Refill  . acetaminophen (TYLENOL) 325 MG tablet Take 650 mg by mouth every 6 (six) hours as needed for mild pain.    . metoprolol succinate (TOPROL XL) 25 MG 24 hr tablet Take 1 tablet (25 mg total) by mouth daily. 90 tablet 3  . Multiple Vitamins-Minerals (CENTRUM MEN) TABS Take 1 tablet by mouth daily.    . rosuvastatin (CRESTOR) 5 MG tablet Take 1 tablet (5 mg total) by mouth daily. 30 tablet 5  . sacubitril-valsartan (ENTRESTO) 49-51 MG Take 1 tablet by mouth 2 (two) times daily. 60 tablet 5  . spironolactone (ALDACTONE) 25 MG tablet Take 1 tablet (25 mg total) by mouth daily. 90 tablet 0  . torsemide (DEMADEX) 20 MG tablet Take 2 tablets (40 mg total) by mouth 2 (two) times daily. 360 tablet 3   No current facility-administered medications for this encounter.    BP 123/72   Pulse 68   Wt (!) 394 lb 4 oz (178.8 kg)   SpO2 100%   BMI 50.62 kg/m  General: NAD, obese Neck: No JVD, no thyromegaly or thyroid nodule.  Lungs: Clear to auscultation bilaterally with normal respiratory effort. CV: Nondisplaced PMI.  Heart regular S1/S2, no S3/S4, no murmur.  Trace ankle edema.  No carotid  bruit.  Normal pedal pulses.  Abdomen: Soft, nontender, no hepatosplenomegaly, no distention.  Skin: Intact without lesions or rashes.  Neurologic: Alert and oriented x 3.  Psych: Normal affect. Extremities: No clubbing or cyanosis.  HEENT: Normal.   1. Chronic systolic CHF: Nonischemic cardiomyopathy.  Development of cardiomyopathy seemed to have occurred after a viral illness, possibly he had viral myocarditis.  Interestingly, he stopped taking his selenium vitamin for 1 year prior to 1/19 (supposed to take post-bariatric surgery).  He has now restarted it.  There is some data on selenium deficiency and cardiomyopathy => but our measured  selenium level was not low.  RHC in 2/19 showed very high filling pressures and low cardiac output. CPX in 4/19 showed that limitation is primarily due to body habitus rather than CHF.  Symptomatically today the patient seems to be doing quite well overall. NYHA class II symptoms.  He does not appear volume overloaded on exam.  I think weight gain is caloric.  - Continue Entresto 24/26 bid.  - Increase spironolactone to 25 mg daily, BMET today and again in 10 days.  - Increase Toprol XL to 50 mg daily.  - Repeat echo in 7/19 for ICD consideration. QRS not significantly prolonged, would not be CRT candidate.  - Probably will not be able to fit in MRI machine for cardiac MRI.  2. H/o bariatric surgery: Gastric sleeve.  I reinforced dietary control and exercise today.   3. OSA: Continue CPAP.  4. CKD: Stage 3.  Follow creatinine closely with medication titration.  BMET today.  5. Pulmonary hypertension: RHC showed pulmonary venous hypertension that will be treated by diuresis.  No indication for selective pulmonary vasodilators.   Followup with echo in 7/19.   Marca Ancona 10/22/2017

## 2017-10-26 ENCOUNTER — Telehealth (HOSPITAL_COMMUNITY): Payer: Self-pay | Admitting: Cardiology

## 2017-10-26 NOTE — Telephone Encounter (Signed)
Patient unable to return for repeat labs in 10 days, request to have labs repeated at PCP office  Order faxed to [facility] @ [phone]

## 2017-10-31 ENCOUNTER — Other Ambulatory Visit (HOSPITAL_COMMUNITY): Payer: BLUE CROSS/BLUE SHIELD

## 2017-11-03 DIAGNOSIS — N183 Chronic kidney disease, stage 3 (moderate): Secondary | ICD-10-CM | POA: Diagnosis not present

## 2017-11-09 DIAGNOSIS — R600 Localized edema: Secondary | ICD-10-CM | POA: Diagnosis not present

## 2017-11-09 DIAGNOSIS — Z6841 Body Mass Index (BMI) 40.0 and over, adult: Secondary | ICD-10-CM | POA: Diagnosis not present

## 2017-11-10 DIAGNOSIS — I8001 Phlebitis and thrombophlebitis of superficial vessels of right lower extremity: Secondary | ICD-10-CM | POA: Diagnosis not present

## 2017-11-10 DIAGNOSIS — R600 Localized edema: Secondary | ICD-10-CM | POA: Diagnosis not present

## 2017-11-22 ENCOUNTER — Other Ambulatory Visit: Payer: Self-pay | Admitting: Cardiology

## 2017-11-22 ENCOUNTER — Other Ambulatory Visit (HOSPITAL_COMMUNITY): Payer: Self-pay | Admitting: Cardiology

## 2017-11-22 DIAGNOSIS — I5022 Chronic systolic (congestive) heart failure: Secondary | ICD-10-CM

## 2017-11-22 MED ORDER — METOPROLOL SUCCINATE ER 50 MG PO TB24: 50.0000 mg | ORAL_TABLET | Freq: Every day | ORAL | 3 refills | Status: DC

## 2017-11-22 NOTE — Telephone Encounter (Signed)
Patient called to request a new rx to match increase done at [facility]

## 2017-12-06 DIAGNOSIS — I10 Essential (primary) hypertension: Secondary | ICD-10-CM | POA: Diagnosis not present

## 2017-12-06 DIAGNOSIS — I5042 Chronic combined systolic (congestive) and diastolic (congestive) heart failure: Secondary | ICD-10-CM | POA: Diagnosis not present

## 2017-12-06 DIAGNOSIS — E782 Mixed hyperlipidemia: Secondary | ICD-10-CM | POA: Diagnosis not present

## 2017-12-18 ENCOUNTER — Ambulatory Visit (HOSPITAL_BASED_OUTPATIENT_CLINIC_OR_DEPARTMENT_OTHER)
Admission: RE | Admit: 2017-12-18 | Discharge: 2017-12-18 | Disposition: A | Discharge status: Home / Self Care | Payer: BLUE CROSS/BLUE SHIELD | Source: Ambulatory Visit | Attending: Cardiology | Admitting: Cardiology

## 2017-12-18 ENCOUNTER — Encounter (HOSPITAL_COMMUNITY): Payer: Self-pay | Admitting: Cardiology

## 2017-12-18 ENCOUNTER — Ambulatory Visit (HOSPITAL_COMMUNITY)
Admission: RE | Admit: 2017-12-18 | Discharge: 2017-12-18 | Disposition: A | Discharge status: Home / Self Care | Payer: BLUE CROSS/BLUE SHIELD | Source: Ambulatory Visit | Attending: Family Medicine | Admitting: Family Medicine

## 2017-12-18 VITALS — BP 117/79 | HR 60 | Wt 397.1 lb

## 2017-12-18 DIAGNOSIS — G4733 Obstructive sleep apnea (adult) (pediatric): Secondary | ICD-10-CM | POA: Insufficient documentation

## 2017-12-18 DIAGNOSIS — I714 Abdominal aortic aneurysm, without rupture: Secondary | ICD-10-CM | POA: Diagnosis not present

## 2017-12-18 DIAGNOSIS — I420 Dilated cardiomyopathy: Secondary | ICD-10-CM | POA: Diagnosis not present

## 2017-12-18 DIAGNOSIS — Z79899 Other long term (current) drug therapy: Secondary | ICD-10-CM | POA: Insufficient documentation

## 2017-12-18 DIAGNOSIS — Z86718 Personal history of other venous thrombosis and embolism: Secondary | ICD-10-CM | POA: Insufficient documentation

## 2017-12-18 DIAGNOSIS — I77810 Thoracic aortic ectasia: Secondary | ICD-10-CM | POA: Diagnosis not present

## 2017-12-18 DIAGNOSIS — Z7901 Long term (current) use of anticoagulants: Secondary | ICD-10-CM | POA: Diagnosis not present

## 2017-12-18 DIAGNOSIS — N183 Chronic kidney disease, stage 3 unspecified: Secondary | ICD-10-CM

## 2017-12-18 DIAGNOSIS — I270 Primary pulmonary hypertension: Secondary | ICD-10-CM | POA: Diagnosis not present

## 2017-12-18 DIAGNOSIS — Z6841 Body Mass Index (BMI) 40.0 and over, adult: Secondary | ICD-10-CM | POA: Insufficient documentation

## 2017-12-18 DIAGNOSIS — I5022 Chronic systolic (congestive) heart failure: Secondary | ICD-10-CM | POA: Diagnosis not present

## 2017-12-18 DIAGNOSIS — I340 Nonrheumatic mitral (valve) insufficiency: Secondary | ICD-10-CM | POA: Insufficient documentation

## 2017-12-18 DIAGNOSIS — I447 Left bundle-branch block, unspecified: Secondary | ICD-10-CM | POA: Diagnosis not present

## 2017-12-18 LAB — BASIC METABOLIC PANEL
ANION GAP: 10 (ref 5–15)
BUN: 16 mg/dL (ref 6–20)
CHLORIDE: 102 mmol/L (ref 98–111)
CO2: 33 mmol/L — ABNORMAL HIGH (ref 22–32)
Calcium: 9.1 mg/dL (ref 8.9–10.3)
Creatinine, Ser: 1.68 mg/dL — ABNORMAL HIGH (ref 0.61–1.24)
GFR calc Af Amer: 53 mL/min — ABNORMAL LOW (ref >60)
GFR calc non Af Amer: 46 mL/min — ABNORMAL LOW (ref >60)
GLUCOSE: 90 mg/dL (ref 70–99)
POTASSIUM: 3.1 mmol/L — AB (ref 3.5–5.1)
SODIUM: 145 mmol/L (ref 135–145)

## 2017-12-18 LAB — CBC
HEMATOCRIT: 42.9 % (ref 39.0–52.0)
HEMOGLOBIN: 14.6 g/dL (ref 13.0–17.0)
MCH: 32.6 pg (ref 26.0–34.0)
MCHC: 34 g/dL (ref 30.0–36.0)
MCV: 95.8 fL (ref 78.0–100.0)
Platelets: 211 10*3/uL (ref 150–400)
RBC: 4.48 MIL/uL (ref 4.22–5.81)
RDW: 12.3 % (ref 11.5–15.5)
WBC: 7.1 10*3/uL (ref 4.0–10.5)

## 2017-12-18 MED ORDER — METOPROLOL SUCCINATE ER 25 MG PO TB24: 75.0000 mg | ORAL_TABLET | Freq: Every day | ORAL | 3 refills | Status: DC

## 2017-12-18 NOTE — Progress Notes (Signed)
  Echocardiogram 2D Echocardiogram has been performed.  Mitchell Jennings 12/18/2017, 12:07 PM

## 2017-12-18 NOTE — Addendum Note (Signed)
Encounter addended by: [name], MD on: 12/18/2017 11:43 PM  Actions taken: Sign clinical note

## 2017-12-18 NOTE — Patient Instructions (Signed)
Increase Metoprolol 75 mg (3 tabs) daily  Labs drawn today (if we do not call you, then your lab work was stable)   You have been referred to EP for ICD  Your physician recommends that you schedule a follow-up appointment in: 1 month with Pharmacy   Your physician recommends that you schedule a follow-up appointment in: 2 months with Dr. Shirlee Latch

## 2017-12-18 NOTE — Progress Notes (Addendum)
PCP: Dr. Reuel Boom Cardiology: Dr. Wyline Mood HF Cardiology: Dr. Shirlee Latch  52 y.o. with history of nonischemic cardiomyopathy was referred by Dr. Wyline Mood for evaluation of CHF.  Patient had no cardiac history prior to 1/19.  However, starting at about 11/18, he began to develop profound exertional dyspnea.  This happened after a flu-like illness.  This was gradually progressive, and he ended up at [facility] in 1/19.  Echo showed EF 20-25% with some RV failure.  He was diuresed extensively and sent to followup with Dr. Wyline Mood.  In 2/19, he had a right heart cath showing no significant coronary disease but elevated left and right-sided filling pressures and pulmonary venous hypertension were noted on RHC.  Cardiac index was low.    Echo was done today, EF 30% with diffuse hypokinesis and normal RV.   Patient returns for followup of CHF. Weight is up about 3 lbs.  He was hit in the right leg recently with a softball and ended up with a DVT.  He is on Xarelto.  He is using CPAP.  No significant dyspnea with his usual activities.  No chest pain.  No orthopnea/PND.    ECG (personally reviewed): NSR, IVCD 124 msec  Labs (2/19): creatinine 1.55 Labs (3/19): TSH normal, K 3.6, creatinine 1.7 Labs (4/19): K 3.4, creatinine 1.67, LDL 149, selenium 144 (normal)  Labs (5/19): K 3.6, creatinine 1.68  PMH: 1. Obesity: h/o bariatric surgery (gastric sleeve).  2. OSA: Uses CPAP.  3. CKD: stage 3.  4. AAA: Abdominal US in 3/19 showed 3.4 cm AAA.  5. Chronic systolic CHF: Nonischemic cardiomyopathy.  First noted in 1/19.  - Echo (1/19, [place]): EF 20-25%, moderate to severe LV dilation, moderate to severe RV dilation with moderately decreased RV systolic function, mild to moderate MR, PASP 57 mmHg.  - RHC/LHC (1/19): No significant CAD.  Mean RA 12, PA 80/30 mean 47, mean PCWP 35, CI 1.7, PVR 2.4 WU.  - CPX (4/19): peak VO2 16.7, VE/VCO2 slope 26, RER 1.14 - Echo (7/19): EF 30% with diffuse hypokinesis,  moderate diastolic dysfunction, normal RV size and systolic function.  6. DVT right leg  FH: Father with MI, uncle with CABG, uncle with MI, mother with MI and CHF.   SH: Married, lives in [place], works for a trucking company, no smoking, no ETOH/drugs.   ROS: All systems reviewed and negative except as per HPI.   Current Outpatient Medications  Medication Sig Dispense Refill  . acetaminophen (TYLENOL) 325 MG tablet Take 650 mg by mouth every 6 (six) hours as needed for mild pain.    . metoprolol succinate (TOPROL-XL) 25 MG 24 hr tablet Take 3 tablets (75 mg total) by mouth daily. 270 tablet 3  . Multiple Vitamins-Minerals (CENTRUM MEN) TABS Take 1 tablet by mouth daily.    . rivaroxaban (XARELTO) 20 MG TABS tablet Take 20 mg by mouth daily with supper.    . rosuvastatin (CRESTOR) 5 MG tablet Take 1 tablet (5 mg total) by mouth daily. 30 tablet 5  . sacubitril-valsartan (ENTRESTO) 49-51 MG Take 1 tablet by mouth 2 (two) times daily. 60 tablet 5  . spironolactone (ALDACTONE) 25 MG tablet Take 1 tablet (25 mg total) by mouth daily. 90 tablet 0  . torsemide (DEMADEX) 20 MG tablet Take 2 tablets (40 mg total) by mouth 2 (two) times daily. 360 tablet 3   No current facility-administered medications for this encounter.    BP 117/79   Pulse 60   Wt (!)  397 lb 1.9 oz (180.1 kg)   SpO2 100%   BMI 50.99 kg/m  General: NAD, obese.  Neck: No JVD, no thyromegaly or thyroid nodule.  Lungs: Clear to auscultation bilaterally with normal respiratory effort. CV: Nondisplaced PMI.  Heart regular S1/S2, no S3/S4, no murmur.  No peripheral edema.  No carotid bruit.  Normal pedal pulses.  Abdomen: Soft, nontender, no hepatosplenomegaly, no distention.  Skin: Intact without lesions or rashes.  Neurologic: Alert and oriented x 3.  Psych: Normal affect. Extremities: No clubbing or cyanosis.  HEENT: Normal.   1. Chronic systolic CHF: Nonischemic cardiomyopathy.  Development of cardiomyopathy seemed to  have occurred after a viral illness, possibly he had viral myocarditis.  Interestingly, he stopped taking his selenium vitamin for 1 year prior to 1/19 (supposed to take post-bariatric surgery).  He has now restarted it.  There is some data on selenium deficiency and cardiomyopathy => but our measured selenium level was not low.  RHC in 2/19 showed very high filling pressures and low cardiac output. CPX in 4/19 showed that limitation is primarily due to body habitus rather than CHF.  Echo was done today and reviewed, EF remains low at 30% with diffuse hypokinesis. Stable NYHA class II symptoms.  He does not appear volume overloaded on exam.  - Continue Entresto 49/51 bid.  - Continue spironolactone 25 mg daily  - Increase Toprol XL to 75 mg daily.  - Continue torsemide 40 mg bid.  BMET today.  - Probably will not be able to fit in MRI machine for cardiac MRI.  - Persistently low EF.  I will refer to EP for ICD.  QRS is 124 msec, LBBB-like IVCD.  Probably to narrow to derive much benefit from CRT.  2. H/o bariatric surgery: Gastric sleeve.  I reinforced dietary control and exercise today.   3. OSA: Continue CPAP.  4. CKD: Stage 3.  Follow creatinine closely with medication titration.  BMET today.  5. Pulmonary hypertension: RHC showed pulmonary venous hypertension that will be treated by diuresis.  No indication for selective pulmonary vasodilators.  6. DVT: He is currently on Xarelto after getting a DVT post-trauma (hit in calf with soft ball).   - CBC today.   Followup with HF pharmacist in 1 month and see me in 2 months.   Marca Ancona 12/18/2017

## 2017-12-21 DIAGNOSIS — N210 Calculus in bladder: Secondary | ICD-10-CM | POA: Diagnosis not present

## 2017-12-21 DIAGNOSIS — Z79899 Other long term (current) drug therapy: Secondary | ICD-10-CM | POA: Diagnosis not present

## 2017-12-21 DIAGNOSIS — Z87442 Personal history of urinary calculi: Secondary | ICD-10-CM | POA: Diagnosis not present

## 2017-12-21 DIAGNOSIS — N201 Calculus of ureter: Secondary | ICD-10-CM | POA: Diagnosis not present

## 2017-12-21 DIAGNOSIS — R319 Hematuria, unspecified: Secondary | ICD-10-CM | POA: Diagnosis not present

## 2017-12-21 DIAGNOSIS — N132 Hydronephrosis with renal and ureteral calculous obstruction: Secondary | ICD-10-CM | POA: Diagnosis not present

## 2017-12-21 DIAGNOSIS — I110 Hypertensive heart disease with heart failure: Secondary | ICD-10-CM | POA: Diagnosis not present

## 2017-12-21 DIAGNOSIS — I509 Heart failure, unspecified: Secondary | ICD-10-CM | POA: Diagnosis not present

## 2017-12-21 DIAGNOSIS — Z7982 Long term (current) use of aspirin: Secondary | ICD-10-CM | POA: Diagnosis not present

## 2017-12-21 DIAGNOSIS — N23 Unspecified renal colic: Secondary | ICD-10-CM | POA: Diagnosis not present

## 2017-12-29 DIAGNOSIS — I5022 Chronic systolic (congestive) heart failure: Secondary | ICD-10-CM | POA: Diagnosis not present

## 2018-01-09 ENCOUNTER — Encounter: Payer: Self-pay | Admitting: Cardiology

## 2018-01-09 ENCOUNTER — Ambulatory Visit (INDEPENDENT_AMBULATORY_CARE_PROVIDER_SITE_OTHER): Payer: BLUE CROSS/BLUE SHIELD | Admitting: Cardiology

## 2018-01-09 VITALS — BP 90/66 | HR 67 | Ht 74.0 in | Wt >= 6400 oz

## 2018-01-09 DIAGNOSIS — I428 Other cardiomyopathies: Secondary | ICD-10-CM

## 2018-01-09 DIAGNOSIS — G4733 Obstructive sleep apnea (adult) (pediatric): Secondary | ICD-10-CM | POA: Diagnosis not present

## 2018-01-09 DIAGNOSIS — Z01812 Encounter for preprocedural laboratory examination: Secondary | ICD-10-CM

## 2018-01-09 DIAGNOSIS — I5022 Chronic systolic (congestive) heart failure: Secondary | ICD-10-CM

## 2018-01-09 NOTE — Progress Notes (Signed)
Electrophysiology Office Note   Date:  01/09/2018   ID:  Mitchell Limes., DOB [birth year], MRN [id number]  PCP:  [name], MD  Cardiologist:  Shirlee Latch Primary Electrophysiologist:  Alazne Quant Jorja Loa, MD    No chief complaint on file.    History of Present Illness: Mitchell Jennings. is a 52 y.o. male who is being seen today for the evaluation of CHF at the request of [name], MD. Presenting today for electrophysiology evaluation.  He has a history of nonischemic cardiomyopathy, obesity status post gastric sleeve surgery, OSA on CPAP, CKD stage III.  November 2018 he developed exertional dyspnea after a flulike illness.  Echo at the time showed an EF of 20 to 25% with RV failure.  He was extensively diuresed.  Left heart cath showed no significant coronary disease as well as elevated right-sided filling pressures and pulmonary venous hypertension.  He is currently on Xarelto for a DVT.    Today, he denies symptoms of palpitations, chest pain, shortness of breath, orthopnea, PND, lower extremity edema, claudication, dizziness, presyncope, syncope, bleeding, or neurologic sequela. The patient is tolerating medications without difficulties.  Since starting his heart failure medications, he is felt well without complaint.   Past Medical History:  Diagnosis Date  . Dilated cardiomyopathy (HCC)   . Pulmonary hypertension (HCC)    Past Surgical History:  Procedure Laterality Date  . BARIATRIC SURGERY    . RIGHT/LEFT HEART CATH AND CORONARY ANGIOGRAPHY N/A 07/31/2017   Procedure: RIGHT/LEFT HEART CATH AND CORONARY ANGIOGRAPHY;  Surgeon: Yvonne Kendall, MD;  Location: [facility] INVASIVE CV LAB;  Service: Cardiovascular;  Laterality: N/A;  . UMBILICAL HERNIA REPAIR       Current Outpatient Medications  Medication Sig Dispense Refill  . acetaminophen (TYLENOL) 325 MG tablet Take 650 mg by mouth every 6 (six) hours as needed for mild pain.    . metoprolol succinate  (TOPROL-XL) 25 MG 24 hr tablet Take 3 tablets (75 mg total) by mouth daily. 270 tablet 3  . Multiple Vitamins-Minerals (CENTRUM MEN) TABS Take 1 tablet by mouth daily.    . potassium chloride SA (K-DUR,KLOR-CON) 20 MEQ tablet Take 20 mEq by mouth daily.  3  . rosuvastatin (CRESTOR) 5 MG tablet Take 1 tablet (5 mg total) by mouth daily. 30 tablet 5  . sacubitril-valsartan (ENTRESTO) 49-51 MG Take 1 tablet by mouth 2 (two) times daily. 60 tablet 5  . spironolactone (ALDACTONE) 25 MG tablet Take 1 tablet (25 mg total) by mouth daily. 90 tablet 0  . torsemide (DEMADEX) 20 MG tablet Take 2 tablets (40 mg total) by mouth 2 (two) times daily. 360 tablet 3   No current facility-administered medications for this visit.     Allergies:   Patient has no known allergies.   Social History:  The patient  reports that he has never smoked. He has never used smokeless tobacco. He reports that he does not drink alcohol or use drugs.   Family History:  The patient's family history includes Diabetes type II in his brother and father; Heart failure in his mother; Hypertension in his mother; Lung cancer in his mother.    ROS:  Please see the history of present illness.   Otherwise, review of systems is positive for none.   All other systems are reviewed and negative.    PHYSICAL EXAM: VS:  BP 90/66   Pulse 67   Ht 6\' 2"  (1.88 m)   Wt (!) 400 lb  13.6 oz (181.8 kg)   SpO2 96%   BMI 51.47 kg/m  , BMI Body mass index is 51.47 kg/m. GEN: Well nourished, well developed, in no acute distress  HEENT: normal  Neck: no JVD, carotid bruits, or masses Cardiac: RRR; no murmurs, rubs, or gallops,no edema  Respiratory:  clear to auscultation bilaterally, normal work of breathing GI: soft, nontender, nondistended, + BS MS: no deformity or atrophy  Skin: warm and dry Neuro:  Strength and sensation are intact Psych: euthymic mood, full affect  EKG:  EKG is not ordered today. Personal review of the ekg ordered  12/18/17 shows sinus rhythm, PVC, left bundle branch block QRS 124 ms  Recent Labs: 12/18/2017: BUN 16; Creatinine, Ser 1.68; Hemoglobin 14.6; Platelets 211; Potassium 3.1; Sodium 145    Lipid Panel  No results found for: CHOL, TRIG, HDL, CHOLHDL, VLDL, LDLCALC, LDLDIRECT   Wt Readings from Last 3 Encounters:  01/09/18 (!) 400 lb 13.6 oz (181.8 kg)  12/18/17 (!) 397 lb 1.9 oz (180.1 kg)  10/18/17 (!) 394 lb 4 oz (178.8 kg)      Other studies Reviewed: Additional studies/ records that were reviewed today include: LHC/RHC 07/31/17  Review of the above records today demonstrates:  1. No angiographically significant coronary artery disease, consistent with non-ischemic cardiomyopathy. 2. Moderately elevated right heart filling pressures. 3. Moderately to severely elevated left heart filling pressures. 4. Severe pulmonary hypertension. 5. Low Fick cardiac output/index.  TTE 12/18/17 - Left ventricle: The cavity size was mildly dilated. Wall   thickness was normal. The estimated ejection fraction was 30%.   Diffuse hypokinesis. Features are consistent with a pseudonormal   left ventricular filling pattern, with concomitant abnormal   relaxation and increased filling pressure (grade 2 diastolic   dysfunction). - Aortic valve: There was no stenosis. - Aorta: Mildly dilated aortic root. Aortic root dimension: 41 mm   (ED). - Mitral valve: There was mild regurgitation. - Left atrium: The atrium was mildly dilated. - Right ventricle: The cavity size was normal. Systolic function   was normal. - Pulmonary arteries: No complete TR doppler jet so unable to   estimate PA systolic pressure. - Systemic veins: IVC not visualized.   ASSESSMENT AND PLAN:  1.  Chronic systolic heart failure due to nonischemic cardiomyopathy: Appears to be due to a viral illness, likely viral cardiomyopathy.  Repeat echo shows an EF of 30% with diffuse hypokinesis.  He is currently on optimal medical therapy  with Entresto, Aldactone, Toprol-XL, and torsemide.  I discussed with him options of further therapy including ICD.  His QRS is narrow and thus he would likely not benefit from CRT D.  Risks and benefits were discussed.  Risks include bleeding, infection, tamponade, pneumothorax.  He understands the risks and is agreed to the procedure.  He is currently off of his Xarelto.  2.  Morbid obesity: History of gastric sleeve surgery.  Reinforced diet control and exercise.  3.  Obstructive sleep apnea: Continue CPAP   4.  DVT: Finished 2 months of therapy.  Currently off Xarelto.   Current medicines are reviewed at length with the patient today.   The patient does not have concerns regarding his medicines.  The following changes were made today:  none  Labs/ tests ordered today include:  No orders of the defined types were placed in this encounter.  Case discussed with primary cardiology Disposition:   FU with Mikalyn Hermida 3 months  Signed, Ernst Cumpston Jorja Loa, MD  01/09/2018 9:52  AM     [place] [address] [place] [address] [phone] (office) [phone] (fax)

## 2018-01-09 NOTE — Patient Instructions (Addendum)
Medication Instructions:  Your physician recommends that you continue on your current medications as directed. Please refer to the Current Medication list given to you today.  * If you need a refill on your cardiac medications before your next appointment, please call your pharmacy.   Labwork: Today: BMET & CBC *We will only notify you of abnormal results, otherwise continue current treatment plan.  Testing/Procedures: Your physician has recommended that you have a defibrillator inserted. An implantable cardioverter defibrillator (ICD) is a small device that is placed in your chest or, in rare cases, your abdomen. This device uses electrical pulses or shocks to help control life-threatening, irregular heartbeats that could lead the heart to suddenly stop beating (sudden cardiac arrest). Leads are attached to the ICD that goes into your heart. This is done in the hospital and usually requires an overnight stay. Please see the instruction below located under special instructions area.  Follow-Up: Your physician recommends that you schedule a follow-up appointment in: 10-14 days, after your procedure on 01/17/2018, with device clinic for a wound check.  Your physician recommends that you schedule a follow-up appointment in: 91 days, after your procedure on 01/17/2018, with Dr. Elberta Fortis.   *Please note that any paperwork needing to be filled out by the provider will need to be addressed at the front desk prior to seeing the provider. Please note that any FMLA, disability or other documents regarding health condition is subject to a $25.00 charge that must be received prior to completion of paperwork in the form of a money order or check.  Thank you for choosing [facility]!!   Dory Horn, RN [phone]  Any Other Special Instructions Will Be Listed Below (If Applicable).   Implantable Device Instructions  You are scheduled for:                  _____ Implantable Cardioverter  Defibrillator  on  01/17/2018  with Dr. Elberta Fortis.  1.   Please arrive at the [facility] Entrance "A"  at [facility] at  12:30 pm on the day of your procedure. (The address is [address]  2. Do not eat or drink after midnight the night before your procedure.  3.   Complete pre procedure  lab work on 01/09/2018.  You do not have to be fasting.  4.   A) - Hold the following medications the morning of your procedure           - All of your medications may be taken with a small amount of water the   morning of your procedure, EXCEPT: Torsemide  5.  Plan for an overnight stay.  Bring your insurance cards and a list of you medications.  6.  Wash your chest and neck with surgical scrub the evening before and the morning of  your procedure.  Rinse well. Please review the surgical scrub instruction sheet given to you.  7. Your chest will need to be shaved prior to this procedure (if needed). We ask that you do this yourself at home 1 to 2 days before or if uncomfortable/unable to do yourself, then it will be performed by the hospital staff the day of.                                                                                                                *  If you have ANY questions after you get home, please call Dory Horn, RN @ [phone].  * Every attempt is made to prevent procedures from being rescheduled.  Due to the nature of  Electrophysiology, rescheduling can happen.  The physician is always aware and directs the staff when this occurs.        Cardioverter Defibrillator Implantation An implantable cardioverter defibrillator (ICD) is a small, lightweight, battery-powered device that is placed (implanted) under the skin in the chest or abdomen. Your caregiver may prescribe an ICD if:  You have had an irregular heart rhythm (arrhythmia) that originated in the lower chambers of the heart (ventricles).  Your heart has been damaged by a disease (such as  coronary artery disease) or heart condition (such as a heart attack). An ICD consists of a battery that lasts several years, a small computer called a pulse generator, and wires called leads that go into the heart. It is used to detect and correct two dangerous arrhythmias: a rapid heart rhythm (tachycardia) and an arrhythmia in which the ventricles contract in an uncoordinated way (fibrillation). When an ICD detects tachycardia, it sends an electrical signal to the heart that restores the heartbeat to normal (cardioversion). This signal is usually painless. If cardioversion does not work or if the ICD detects fibrillation, it delivers a small electrical shock to the heart (defibrillation) to restart the heart. The shock may feel like a strong jolt in the chest. ICDs may be programmed to correct other problems. Sometimes, ICDs are programmed to act as another type of implantable device called a pacemaker. Pacemakers are used to treat a slow heartbeat (bradycardia). LET YOUR CAREGIVER KNOW ABOUT:  Any allergies you have.  All medicines you are taking, including vitamins, herbs, eyedrops, and over-the-counter medicines and creams.  Previous problems you or members of your family have had with the use of anesthetics.  Any blood disorders you have had.  Other health problems you have. RISKS AND COMPLICATIONS Generally, the procedure to implant an ICD is safe. However, as with any surgical procedure, complications can occur. Possible complications associated with implanting an ICD include:  Swelling, bleeding, or bruising at the site where the ICD was implanted.  Infection at the site where the ICD was implanted.  A reaction to medicine used during the procedure.  Nerve, heart, or blood vessel damage.  Blood clots. BEFORE THE PROCEDURE  You may need to have blood tests, heart tests, or a chest X-ray done before the day of the procedure.  Ask your caregiver about changing or stopping your  regular medicines.  Make plans to have someone drive you home. You may need to stay in the hospital overnight after the procedure.  Stop smoking at least 24 hours before the procedure.  Take a bath or shower the night before the procedure. You may need to scrub your chest or abdomen with a special type of soap.  Do not eat or drink before your procedure for as long as directed by your caregiver. Ask if it is okay to take any needed medicine with a small sip of water. PROCEDURE  The procedure to implant an ICD in your chest or abdomen is usually done at a hospital in a room that has a large X-ray machine called a fluoroscope. The machine will be above you during the procedure. It will help your caregiver see your heart during the procedure. Implanting an ICD usually takes 1-3 hours. Before the procedure:   Small monitors will be  put on your body. They will be used to check your heart, blood pressure, and oxygen level.  A needle will be put into a vein in your hand or arm. This is called an intravenous (IV) access tube. Fluids and medicine will flow directly into your body through the IV tube.  Your chest or abdomen will be cleaned with a germ-killing (antiseptic) solution. The area may be shaved.  You may be given medicine to help you relax (sedative).  You will be given a medicine called a local anesthetic. This medicine will make the surgical site numb while the ICD is implanted. You will be sleepy but awake during the procedure. After you are numb the procedure will begin. The caregiver will:  Make a small cut (incision). This will make a pocket deep under your skin that will hold the pulse generator.  Guide the leads through a large blood vessel into your heart and attach them to the heart muscles. Depending on the ICD, the leads may go into one ventricle or they may go to both ventricles and into an upper chamber of the heart (atrium).  Test the ICD.  Close the incision with  stitches, glue, or staples. AFTER THE PROCEDURE  You may feel pain. Some pain is normal. It may last a few days.  You may stay in a recovery area until the local anesthetic has worn off. Your blood pressure and pulse will be checked often. You will be taken to a room where your heart will be monitored.  A chest X-ray will be taken. This is done to check that the cardioverter defibrillator is in the right place.  You may stay in the hospital overnight.  A slight bump may be seen over the skin where the ICD was placed. Sometimes, it is possible to feel the ICD under the skin. This is normal.  In the months and years afterward, your caregiver will check the device, the leads, and the battery every few months. Eventually, when the battery is low, the ICD will be replaced.   This information is not intended to replace advice given to you by your health care provider. Make sure you discuss any questions you have with your health care provider.   Document Released: 02/12/2002 Document Revised: 03/13/2013 Document Reviewed: 06/11/2012 Elsevier Interactive Patient Education 2016 [employer]    Cardioverter Defibrillator Implantation, Care After This sheet gives you information about how to care for yourself after your procedure. Your health care provider may also give you more specific instructions. If you have problems or questions, contact your health care provider. What can I expect after the procedure? After the procedure, it is common to have:  Some pain. It may last a few days.  A slight bump over the skin where the device was placed. Sometimes, it is possible to feel the device under the skin. This is normal.  During the months and years after your procedure, your health care provider will check the device, the leads, and the battery every few months. Eventually, when the battery is low, the device will be replaced. Follow these instructions at home: Medicines  Take  over-the-counter and prescription medicines only as told by your health care provider.  If you were prescribed an antibiotic medicine, take it as told by your health care provider. Do not stop taking the antibiotic even if you start to feel better. Incision care   Follow instructions from your health care provider about how to take care of your incision  area. Make sure you: ? Wash your hands with soap and water before you change your bandage (dressing). If soap and water are not available, use hand sanitizer. ? Change your dressing as told by your health care provider. ? Leave stitches (sutures), skin glue, or adhesive strips in place. These skin closures may need to stay in place for 2 weeks or longer. If adhesive strip edges start to loosen and curl up, you may trim the loose edges. Do not remove adhesive strips completely unless your health care provider tells you to do that.  Check your incision area every day for signs of infection. Check for: ? More redness, swelling, or pain. ? More fluid or blood. ? Warmth. ? Pus or a bad smell.  Do not use lotions or ointments near the incision area unless told by your health care provider.  Keep the incision area clean and dry for 2-3 days after the procedure or for as long as told by your health care provider. It takes several weeks for the incision site to heal completely.  Do not take baths, swim, or use a hot tub until your health care provider approves. Activity  Try to walk a little every day. Exercising is important after this procedure. Also, use your shoulder on the side of the defibrillator in daily tasks that do not require a lot of motion.  For at least 6 weeks: ? Do not lift your upper arm above your shoulders. This means no tennis, golf, or swimming for this period of time. If you tend to sleep with your arm above your head, use a restraint to prevent this during sleep. ? Avoid sudden jerking, pulling, or chopping movements that  pull your upper arm far away from your body.  Ask your health care provider when you may go back to work.  Check with your health care provider before you start to drive or play sports. Electric and magnetic fields  Tell all health care providers that you have a defibrillator. This may prevent them from giving you an MRI scan because strong magnets are used for that test.  If you must pass through a metal detector, quickly walk through it. Do not stop under the detector, and do not stand near it.  Avoid places or objects that have a strong electric or magnetic field, including: ? [employer]. At the airport, let officials know that you have a defibrillator. Your defibrillator ID card will let you be checked in a way that is safe for you and will not damage your defibrillator. Also, do not let a security person wave a magnetic wand near your defibrillator. That can make it stop working. ? Power plants. ? Large electrical generators. ? Anti-theft systems or electronic article surveillance (EAS). ? Radiofrequency transmission towers, such as cell phone and radio towers.  Do not use amateur (ham) radio equipment or electric (arc) welding torches. Some devices are safe to use if held at least 12 inches (30 cm) from your defibrillator. These include power tools, lawn mowers, and speakers. If you are unsure if something is safe to use, ask your health care provider.  Do not use MP3 player headphones. They have magnets.  You may safely use electric blankets, heating pads, computers, and microwave ovens.  When using your cell phone, hold it to the ear that is on the opposite side from the defibrillator. Do not leave your cell phone in a pocket over the defibrillator. General instructions  Follow diet instructions from  your health care provider, if this applies.  Always keep your defibrillator ID card with you. The card should list the implant date, device model, and manufacturer.  Consider wearing a medical alert bracelet or necklace.  Have your defibrillator checked every 3-6 months or as often as told by your health care provider. Most defibrillators last for 4-8 years.  Keep all follow-up visits as told by your health care provider. This is important for your health care provider to make sure your chest is healing the way it should. Ask your health care provider when you should come back to have your stitches or staples taken out. Contact a health care provider if:  You feel one shock in your chest.  You gain weight suddenly.  Your legs or feet swell more than they have before.  It feels like your heart is fluttering or skipping beats (heart palpitations).  You have more redness, swelling, or pain around your incision.  You have more fluid or blood coming from your incision.  Your incision feels warm to the touch.  You have pus or a bad smell coming from your incision.  You have a fever. Get help right away if:  You have chest pain.  You feel more than one shock.  You feel more short of breath than you have felt before.  You feel more light-headed than you have felt before.  Your incision starts to open up. This information is not intended to replace advice given to you by your health care provider. Make sure you discuss any questions you have with your health care provider. Document Released: 12/10/2004 Document Revised: 12/11/2015 Document Reviewed: 10/28/2015 Elsevier Interactive Patient Education  2018 [employer].     Supplemental Discharge Instructions for  Pacemaker/Defibrillator Patients  ACTIVITY No heavy lifting or vigorous activity with your left/right arm for 6 to 8 weeks.  Do not raise your left/right arm above your head for one week.  Gradually raise your affected arm as drawn below.           __  NO DRIVING for     ; you may begin driving on     .  WOUND CARE - Keep the wound area clean and dry.  Do not get this area  wet for one week. No showers for one week; you may shower on     . - The tape/steri-strips on your wound will fall off; do not pull them off.  No bandage is needed on the site.  DO  NOT apply any creams, oils, or ointments to the wound area. - If you notice any drainage or discharge from the wound, any swelling or bruising at the site, or you develop a fever > 101? F after you are discharged home, call the office at once.  SPECIAL INSTRUCTIONS - You are still able to use cellular telephones; use the ear opposite the side where you have your pacemaker/defibrillator.  Avoid carrying your cellular phone near your device. - When traveling through airports, show security personnel your identification card to avoid being screened in the metal detectors.  Ask the security personnel to use the hand wand. - Avoid arc welding equipment, MRI testing (magnetic resonance imaging), TENS units (transcutaneous nerve stimulators).  Call the office for questions about other devices. - Avoid electrical appliances that are in poor condition or are not properly grounded. - Microwave ovens are safe to be near or to operate.  ADDITIONAL INFORMATION FOR DEFIBRILLATOR PATIENTS SHOULD YOUR DEVICE GO OFF: -  If your device goes off ONCE and you feel fine afterward, notify the device clinic nurses. - If your device goes off ONCE and you do not feel well afterward, call 911. - If your device goes off TWICE, call 911. - If your device goes off [place], call 911.  DO NOT DRIVE YOURSELF OR A FAMILY MEMBER WITH A DEFIBRILLATOR TO THE HOSPITAL-CALL 911.

## 2018-01-10 DIAGNOSIS — E782 Mixed hyperlipidemia: Secondary | ICD-10-CM | POA: Diagnosis not present

## 2018-01-10 DIAGNOSIS — I10 Essential (primary) hypertension: Secondary | ICD-10-CM | POA: Diagnosis not present

## 2018-01-10 DIAGNOSIS — G4733 Obstructive sleep apnea (adult) (pediatric): Secondary | ICD-10-CM | POA: Diagnosis not present

## 2018-01-10 DIAGNOSIS — E1165 Type 2 diabetes mellitus with hyperglycemia: Secondary | ICD-10-CM | POA: Diagnosis not present

## 2018-01-10 DIAGNOSIS — Z9189 Other specified personal risk factors, not elsewhere classified: Secondary | ICD-10-CM | POA: Diagnosis not present

## 2018-01-10 LAB — CBC
Hematocrit: 40 % (ref 37.5–51.0)
Hemoglobin: 14.7 g/dL (ref 13.0–17.7)
MCH: 33.6 pg — AB (ref 26.6–33.0)
MCHC: 36.8 g/dL — AB (ref 31.5–35.7)
MCV: 92 fL (ref 79–97)
Platelets: 192 10*3/uL (ref 150–450)
RBC: 4.37 x10E6/uL (ref 4.14–5.80)
RDW: 12.5 % (ref 12.3–15.4)
WBC: 5 10*3/uL (ref 3.4–10.8)

## 2018-01-10 LAB — BASIC METABOLIC PANEL
BUN / CREAT RATIO: 16 (ref 9–20)
BUN: 27 mg/dL — ABNORMAL HIGH (ref 6–24)
CHLORIDE: 100 mmol/L (ref 96–106)
CO2: 26 mmol/L (ref 20–29)
Calcium: 9 mg/dL (ref 8.7–10.2)
Creatinine, Ser: 1.72 mg/dL — ABNORMAL HIGH (ref 0.76–1.27)
GFR calc Af Amer: 52 mL/min/{1.73_m2} — ABNORMAL LOW (ref >59)
GFR calc non Af Amer: 45 mL/min/{1.73_m2} — ABNORMAL LOW (ref >59)
GLUCOSE: 93 mg/dL (ref 65–99)
POTASSIUM: 3.6 mmol/L (ref 3.5–5.2)
SODIUM: 141 mmol/L (ref 134–144)

## 2018-01-15 DIAGNOSIS — I5042 Chronic combined systolic (congestive) and diastolic (congestive) heart failure: Secondary | ICD-10-CM | POA: Diagnosis not present

## 2018-01-15 DIAGNOSIS — E782 Mixed hyperlipidemia: Secondary | ICD-10-CM | POA: Diagnosis not present

## 2018-01-15 DIAGNOSIS — Z1389 Encounter for screening for other disorder: Secondary | ICD-10-CM | POA: Diagnosis not present

## 2018-01-15 DIAGNOSIS — Z1331 Encounter for screening for depression: Secondary | ICD-10-CM | POA: Diagnosis not present

## 2018-01-15 DIAGNOSIS — I10 Essential (primary) hypertension: Secondary | ICD-10-CM | POA: Diagnosis not present

## 2018-01-17 ENCOUNTER — Other Ambulatory Visit: Payer: Self-pay

## 2018-01-17 ENCOUNTER — Ambulatory Visit (HOSPITAL_COMMUNITY)
Admission: RE | Admit: 2018-01-17 | Discharge: 2018-01-18 | Disposition: A | Discharge status: Home / Self Care | Payer: BLUE CROSS/BLUE SHIELD | Source: Ambulatory Visit | Attending: Cardiology | Admitting: Cardiology

## 2018-01-17 ENCOUNTER — Encounter (HOSPITAL_COMMUNITY): Payer: Self-pay

## 2018-01-17 ENCOUNTER — Ambulatory Visit (HOSPITAL_COMMUNITY)
Admission: RE | Disposition: A | Discharge status: Home / Self Care | Payer: Self-pay | Source: Ambulatory Visit | Attending: Cardiology

## 2018-01-17 ENCOUNTER — Other Ambulatory Visit (HOSPITAL_COMMUNITY): Payer: BLUE CROSS/BLUE SHIELD

## 2018-01-17 DIAGNOSIS — Z006 Encounter for examination for normal comparison and control in clinical research program: Secondary | ICD-10-CM | POA: Diagnosis not present

## 2018-01-17 DIAGNOSIS — I5022 Chronic systolic (congestive) heart failure: Secondary | ICD-10-CM | POA: Diagnosis not present

## 2018-01-17 DIAGNOSIS — Z6841 Body Mass Index (BMI) 40.0 and over, adult: Secondary | ICD-10-CM | POA: Diagnosis not present

## 2018-01-17 DIAGNOSIS — Z955 Presence of coronary angioplasty implant and graft: Secondary | ICD-10-CM | POA: Diagnosis not present

## 2018-01-17 DIAGNOSIS — Z86718 Personal history of other venous thrombosis and embolism: Secondary | ICD-10-CM | POA: Diagnosis not present

## 2018-01-17 DIAGNOSIS — Z9889 Other specified postprocedural states: Secondary | ICD-10-CM | POA: Insufficient documentation

## 2018-01-17 DIAGNOSIS — I447 Left bundle-branch block, unspecified: Secondary | ICD-10-CM | POA: Diagnosis not present

## 2018-01-17 DIAGNOSIS — I428 Other cardiomyopathies: Secondary | ICD-10-CM | POA: Diagnosis not present

## 2018-01-17 DIAGNOSIS — Z79899 Other long term (current) drug therapy: Secondary | ICD-10-CM | POA: Diagnosis not present

## 2018-01-17 DIAGNOSIS — Z95818 Presence of other cardiac implants and grafts: Secondary | ICD-10-CM

## 2018-01-17 DIAGNOSIS — I272 Pulmonary hypertension, unspecified: Secondary | ICD-10-CM | POA: Insufficient documentation

## 2018-01-17 DIAGNOSIS — Z9884 Bariatric surgery status: Secondary | ICD-10-CM | POA: Insufficient documentation

## 2018-01-17 DIAGNOSIS — G4733 Obstructive sleep apnea (adult) (pediatric): Secondary | ICD-10-CM | POA: Diagnosis not present

## 2018-01-17 DIAGNOSIS — N183 Chronic kidney disease, stage 3 (moderate): Secondary | ICD-10-CM | POA: Insufficient documentation

## 2018-01-17 DIAGNOSIS — I429 Cardiomyopathy, unspecified: Secondary | ICD-10-CM | POA: Diagnosis not present

## 2018-01-17 DIAGNOSIS — Z8249 Family history of ischemic heart disease and other diseases of the circulatory system: Secondary | ICD-10-CM | POA: Diagnosis not present

## 2018-01-17 HISTORY — PX: ICD IMPLANT: EP1208

## 2018-01-17 LAB — SURGICAL PCR SCREEN
MRSA, PCR: NEGATIVE
Staphylococcus aureus: POSITIVE — AB

## 2018-01-17 SURGERY — ICD IMPLANT
Anesthesia: LOCAL

## 2018-01-17 MED ORDER — HEPARIN (PORCINE) IN NACL 1000-0.9 UT/500ML-% IV SOLN
INTRAVENOUS | Status: DC | PRN
  Administered 2018-01-17: 500 mL

## 2018-01-17 MED ORDER — ACETAMINOPHEN 325 MG PO TABS: 650.0000 mg | ORAL_TABLET | Freq: Four times a day (QID) | ORAL | Status: DC | PRN

## 2018-01-17 MED ORDER — MIDAZOLAM HCL 5 MG/5ML IJ SOLN
INTRAMUSCULAR | Status: AC
  Filled 2018-01-17: qty 5

## 2018-01-17 MED ORDER — ROSUVASTATIN CALCIUM 5 MG PO TABS
5.0000 mg | ORAL_TABLET | Freq: Every day | ORAL | Status: DC
  Administered 2018-01-17 – 2018-01-18 (×2): 5 mg via ORAL
  Filled 2018-01-17 (×3): qty 1

## 2018-01-17 MED ORDER — SPIRONOLACTONE 25 MG PO TABS
25.0000 mg | ORAL_TABLET | Freq: Every day | ORAL | Status: DC
  Administered 2018-01-18: 25 mg via ORAL
  Filled 2018-01-17: qty 1

## 2018-01-17 MED ORDER — LIDOCAINE HCL (PF) 1 % IJ SOLN
INTRAMUSCULAR | Status: DC | PRN
  Administered 2018-01-17 (×2): 20 mL
  Administered 2018-01-17: 30 mL

## 2018-01-17 MED ORDER — POTASSIUM CHLORIDE CRYS ER 20 MEQ PO TBCR
20.0000 meq | EXTENDED_RELEASE_TABLET | Freq: Every day | ORAL | Status: DC
  Administered 2018-01-18: 20 meq via ORAL
  Filled 2018-01-17: qty 1

## 2018-01-17 MED ORDER — TORSEMIDE 20 MG PO TABS
40.0000 mg | ORAL_TABLET | Freq: Two times a day (BID) | ORAL | Status: DC
  Administered 2018-01-18: 40 mg via ORAL
  Filled 2018-01-17 (×2): qty 2

## 2018-01-17 MED ORDER — DEXTROSE 5 % IV SOLN
3.0000 g | INTRAVENOUS | Status: AC
  Administered 2018-01-17: 3 g via INTRAVENOUS
  Filled 2018-01-17: qty 3000

## 2018-01-17 MED ORDER — LIDOCAINE HCL 1 % IJ SOLN
INTRAMUSCULAR | Status: AC
  Filled 2018-01-17: qty 20

## 2018-01-17 MED ORDER — HEPARIN (PORCINE) IN NACL 1000-0.9 UT/500ML-% IV SOLN
INTRAVENOUS | Status: AC
  Filled 2018-01-17: qty 500

## 2018-01-17 MED ORDER — ONDANSETRON HCL 4 MG/2ML IJ SOLN: 4.0000 mg | Freq: Four times a day (QID) | INTRAMUSCULAR | Status: DC | PRN

## 2018-01-17 MED ORDER — LIDOCAINE HCL 1 % IJ SOLN
INTRAMUSCULAR | Status: AC
  Filled 2018-01-17: qty 60

## 2018-01-17 MED ORDER — ACETAMINOPHEN 325 MG PO TABS
325.0000 mg | ORAL_TABLET | ORAL | Status: DC | PRN
  Administered 2018-01-17 – 2018-01-18 (×2): 650 mg via ORAL
  Filled 2018-01-17 (×2): qty 2

## 2018-01-17 MED ORDER — CEFAZOLIN SODIUM-DEXTROSE 1-4 GM/50ML-% IV SOLN
1.0000 g | Freq: Four times a day (QID) | INTRAVENOUS | Status: AC
  Administered 2018-01-17 – 2018-01-18 (×3): 1 g via INTRAVENOUS
  Filled 2018-01-17 (×3): qty 50

## 2018-01-17 MED ORDER — MUPIROCIN 2 % EX OINT
1.0000 "application " | TOPICAL_OINTMENT | Freq: Once | CUTANEOUS | Status: AC
  Administered 2018-01-17: 1 via TOPICAL
  Filled 2018-01-17 (×2): qty 22

## 2018-01-17 MED ORDER — GENTAMICIN SULFATE 40 MG/ML IJ SOLN
80.0000 mg | INTRAMUSCULAR | Status: AC
  Administered 2018-01-17: 80 mg
  Filled 2018-01-17: qty 2

## 2018-01-17 MED ORDER — FENTANYL CITRATE (PF) 100 MCG/2ML IJ SOLN
INTRAMUSCULAR | Status: AC
  Filled 2018-01-17: qty 2

## 2018-01-17 MED ORDER — SACUBITRIL-VALSARTAN 49-51 MG PO TABS
1.0000 | ORAL_TABLET | Freq: Two times a day (BID) | ORAL | Status: DC
  Administered 2018-01-17 – 2018-01-18 (×2): 1 via ORAL
  Filled 2018-01-17 (×2): qty 1

## 2018-01-17 MED ORDER — SODIUM CHLORIDE 0.9 % IV SOLN
INTRAVENOUS | Status: AC
  Filled 2018-01-17: qty 2

## 2018-01-17 MED ORDER — SODIUM CHLORIDE 0.9 % IV SOLN
INTRAVENOUS | Status: DC
  Administered 2018-01-17: 13:00:00 via INTRAVENOUS

## 2018-01-17 MED ORDER — ADULT MULTIVITAMIN W/MINERALS CH
1.0000 | ORAL_TABLET | Freq: Every day | ORAL | Status: DC
  Administered 2018-01-18: 1 via ORAL
  Filled 2018-01-17: qty 1

## 2018-01-17 MED ORDER — METOPROLOL SUCCINATE ER 50 MG PO TB24
75.0000 mg | ORAL_TABLET | Freq: Every day | ORAL | Status: DC
  Administered 2018-01-18: 75 mg via ORAL
  Filled 2018-01-17: qty 1

## 2018-01-17 SURGICAL SUPPLY — 8 items
CABLE SURGICAL S-101-97-12 (CABLE) ×2 IMPLANT
ICD EVERA DR XT MRI DDMB1D4 (ICD Generator) ×2 IMPLANT
LEAD CAPSURE NOVUS 5076-52CM (Lead) ×2 IMPLANT
LEAD SPRINT QUAT SEC 6935M-62 (Lead) ×2 IMPLANT
PAD PRO RADIOLUCENT 2001M-C (PAD) ×2 IMPLANT
SHEATH CLASSIC 7F (SHEATH) ×2 IMPLANT
SHEATH CLASSIC 9F (SHEATH) ×2 IMPLANT
TRAY PACEMAKER INSERTION (PACKS) ×2 IMPLANT

## 2018-01-17 NOTE — H&P (Signed)
Mitchell Jennings. has presented today for surgery, with the diagnosis of CHF.  The various methods of treatment have been discussed with the patient and family. After consideration of risks, benefits and other options for treatment, the patient has consented to  Procedure(s): ICD implant as a surgical intervention .  Risks include but not limited to bleeding, tamponade, infection, pneumothorax, among others. The patient's history has been reviewed, patient examined, no change in status, stable for surgery.  I have reviewed the patient's chart and labs.  Questions were answered to the patient's satisfaction.    Suren Payne Elberta Fortis, MD 01/17/2018 1:16 PM  ICD Criteria  Current LVEF:30%. Within 12 months prior to implant: Yes   Heart failure history: Yes, Class II  Cardiomyopathy history: Yes, Non-Ischemic Cardiomyopathy.  Atrial Fibrillation/Atrial Flutter: No.  Ventricular tachycardia history: No.  Cardiac arrest history: No.  History of syndromes with risk of sudden death: No.  Previous ICD: No.  Current ICD indication: Primary  PPM indication: No.   Class I or II Bradycardia indication present: No  Beta Blocker therapy for 3 or more months: Yes, prescribed.   Ace Inhibitor/ARB therapy for 3 or more months: Yes, prescribed.

## 2018-01-18 ENCOUNTER — Ambulatory Visit (HOSPITAL_COMMUNITY)
Admission: RE | Admit: 2018-01-18 | Discharge: 2018-01-18 | Disposition: A | Discharge status: Home / Self Care | Payer: BLUE CROSS/BLUE SHIELD | Source: Ambulatory Visit | Attending: Cardiology | Admitting: Cardiology

## 2018-01-18 ENCOUNTER — Other Ambulatory Visit (HOSPITAL_COMMUNITY): Payer: BLUE CROSS/BLUE SHIELD

## 2018-01-18 ENCOUNTER — Encounter (HOSPITAL_COMMUNITY): Payer: Self-pay | Admitting: Cardiology

## 2018-01-18 ENCOUNTER — Ambulatory Visit (HOSPITAL_COMMUNITY): Payer: BLUE CROSS/BLUE SHIELD

## 2018-01-18 DIAGNOSIS — Z9889 Other specified postprocedural states: Secondary | ICD-10-CM | POA: Diagnosis not present

## 2018-01-18 DIAGNOSIS — I429 Cardiomyopathy, unspecified: Secondary | ICD-10-CM | POA: Diagnosis not present

## 2018-01-18 DIAGNOSIS — Z8249 Family history of ischemic heart disease and other diseases of the circulatory system: Secondary | ICD-10-CM | POA: Diagnosis not present

## 2018-01-18 DIAGNOSIS — Z6841 Body Mass Index (BMI) 40.0 and over, adult: Secondary | ICD-10-CM | POA: Diagnosis not present

## 2018-01-18 DIAGNOSIS — I428 Other cardiomyopathies: Secondary | ICD-10-CM

## 2018-01-18 DIAGNOSIS — G4733 Obstructive sleep apnea (adult) (pediatric): Secondary | ICD-10-CM | POA: Diagnosis not present

## 2018-01-18 DIAGNOSIS — Z006 Encounter for examination for normal comparison and control in clinical research program: Secondary | ICD-10-CM | POA: Diagnosis not present

## 2018-01-18 DIAGNOSIS — Z9884 Bariatric surgery status: Secondary | ICD-10-CM | POA: Diagnosis not present

## 2018-01-18 DIAGNOSIS — Z86718 Personal history of other venous thrombosis and embolism: Secondary | ICD-10-CM | POA: Diagnosis not present

## 2018-01-18 DIAGNOSIS — Z79899 Other long term (current) drug therapy: Secondary | ICD-10-CM | POA: Diagnosis not present

## 2018-01-18 DIAGNOSIS — I447 Left bundle-branch block, unspecified: Secondary | ICD-10-CM | POA: Diagnosis not present

## 2018-01-18 DIAGNOSIS — N183 Chronic kidney disease, stage 3 (moderate): Secondary | ICD-10-CM | POA: Diagnosis not present

## 2018-01-18 DIAGNOSIS — Z955 Presence of coronary angioplasty implant and graft: Secondary | ICD-10-CM | POA: Diagnosis not present

## 2018-01-18 DIAGNOSIS — I272 Pulmonary hypertension, unspecified: Secondary | ICD-10-CM | POA: Diagnosis not present

## 2018-01-18 DIAGNOSIS — I5022 Chronic systolic (congestive) heart failure: Secondary | ICD-10-CM | POA: Diagnosis not present

## 2018-01-18 MED ORDER — SACUBITRIL-VALSARTAN 97-103 MG PO TABS: 1.0000 | ORAL_TABLET | Freq: Two times a day (BID) | ORAL | 5 refills | Status: DC

## 2018-01-18 MED FILL — Lidocaine HCl Local Inj 1%: INTRAMUSCULAR | Qty: 100 | Status: AC

## 2018-01-18 MED FILL — Fentanyl Citrate Preservative Free (PF) Inj 100 MCG/2ML: INTRAMUSCULAR | Qty: 2 | Status: AC

## 2018-01-18 MED FILL — Midazolam HCl Inj 5 MG/5ML (Base Equivalent): INTRAMUSCULAR | Qty: 5 | Status: AC

## 2018-01-18 NOTE — Patient Instructions (Signed)
It was great to see you today!  Please INCREASE your Entresto to 97-103 mg TWICE DAILY.   You are scheduled to see the pharmacist again on Tuesday 8/27 at 1:00 PM.

## 2018-01-18 NOTE — Progress Notes (Addendum)
0715 Pt sitting on side of bed. A&Ox4, c/o some soreness to incision site. Site with steri strips with scant dried drainage present. Reviewed post pacer insertion instructions and precautions with pt. Pt understands without assistance. Pt stated MD saw this morning and awaiting D/C orders. Fall precautions in place, WCTM.   0900 Pt assessed see flowsheet, medicated, see MAR. Wife at bedside, updated with POC.   1030 NP here to see pt and new orders received for D/C. IV, tele monitor, O2 sensor removed without difficulty. Pt awaiting D/C instructions.   1100 Reviewed discharge instructions with pt and wife, reviewed med list and directions for post op pacer and wound care. All questions answered, pt and wife understand without assistance. Pt down to car via wheelchair. Pt had cell phone only while leaving room.

## 2018-01-18 NOTE — Discharge Instructions (Signed)
° ° °  Supplemental Discharge Instructions for  Pacemaker/Defibrillator Patients  Activity No heavy lifting or vigorous activity with your left/right arm for 6 to 8 weeks.  Do not raise your left/right arm above your head for one week.  Gradually raise your affected arm as drawn below.              01/21/18                    01/22/18                    01/23/18                   01/24/18 __  NO DRIVING for until cleared to at your wound check visit  WOUND CARE - Keep the wound area clean and dry.  Do not get this area wet, no showers until cleared to at your wound check visit - The tape/steri-strips on your wound will fall off; do not pull them off.  No bandage is needed on the site.  DO  NOT apply any creams, oils, or ointments to the wound area. - If you notice any drainage or discharge from the wound, any swelling or bruising at the site, or you develop a fever > 101? F after you are discharged home, call the office at once.  Special Instructions - You are still able to use cellular telephones; use the ear opposite the side where you have your pacemaker/defibrillator.  Avoid carrying your cellular phone near your device. - When traveling through airports, show security personnel your identification card to avoid being screened in the metal detectors.  Ask the security personnel to use the hand wand. - Avoid arc welding equipment, MRI testing (magnetic resonance imaging), TENS units (transcutaneous nerve stimulators).  Call the office for questions about other devices. - Avoid electrical appliances that are in poor condition or are not properly grounded. - Microwave ovens are safe to be near or to operate.  Additional information for defibrillator patients should your device go off: - If your device goes off ONCE and you feel fine afterward, notify the device clinic nurses. - If your device goes off ONCE and you do not feel well afterward, call 911. - If your device goes off TWICE, call  911. - If your device goes off THREE times in one day, call 911.  DO NOT DRIVE YOURSELF OR A FAMILY MEMBER WITH A DEFIBRILLATOR TO THE HOSPITAL--CALL 911.

## 2018-01-18 NOTE — Discharge Summary (Addendum)
ELECTROPHYSIOLOGY PROCEDURE DISCHARGE SUMMARY    Patient ID: Mitchell Jennings.,  MRN: [id number], DOB/AGE: 07-20-1965 52 y.o.  Admit date: 01/17/2018 Discharge date: 01/18/2018  Primary Care Physician: [name], MD  Primary Cardiologist: Dr. Wyline Mood AHF: Dr. Shirlee Latch Electrophysiologist: Dr. Shirlee Latch  Primary Discharge Diagnosis:  1. NICM  Secondary Discharge Diagnosis:  1. Obesity 2. CKD (III) 3. Chronic CHF (systolic)     Compensated currently 4. OSA w/CPAP 5. DVT, patient reports completed Xarelto, no longer takes  No Known Allergies   Procedures This Admission:  1.  Implantation of a MDT dual chamber ICD on 01/17/18 by Dr Elberta Fortis.  The patient received a Medtronic Evera MRI XT DR SureScan (serial  Number [id number]) ICD, Medtronic model [id number]  (serial # [id number]  ) right atrial lead and a Medtronic, model [id number] (serial number [id number]) right ventricular defibrillator lead  DFT's were deferred at time of implant.   There were no immediate post procedure complications. 2.  CXR on 01/18/18 demonstrated no pneumothorax status post device implantation.   Brief HPI: Mitchell Jennings. is a 52 y.o. male was referred to electrophysiology in the outpatient setting for consideration of ICD implantation.  Past medical history includes as above.  The patient has persistent LV dysfunction despite guideline directed therapy.  Risks, benefits, and alternatives to ICD implantation were reviewed with the patient who wished to proceed.   Hospital Course:  The patient was admitted and underwent implantation of an ICD with details as outlined above. He was monitored on telemetry overnight which demonstrated SR, occ PVCs, one NSVT (5 beats).  Left chest was without hematoma or ecchymosis.  The device was interrogated and found to be functioning normally.  CXR was obtained and demonstrated no pneumothorax status post device implantation.  Wound care, arm mobility, and  restrictions were reviewed with the patient.  The patient feels well this morning, no CP or SOB, he was examined by Dr. Elberta Fortis and considered stable for discharge to home.   The patient's discharge medications include an ARB (Entresto) and beta blocker (metoprolol).   Physical Exam: Vitals:   01/17/18 2004 01/18/18 0308 01/18/18 0600 01/18/18 0912  BP: 105/64 118/74 107/74 105/68  Pulse: 73  80 71  Resp:  16    Temp: 98.3 F (36.8 C) 97.8 F (36.6 C)    TempSrc: Oral Oral    SpO2:  99%    Weight:  (!) 181.3 kg    Height:        GEN- The patient is well appearing, alert and oriented x 3 today.   HEENT: normocephalic, atraumatic; sclera clear, conjunctiva pink; hearing intact; oropharynx clear Lungs- CTA b/l, normal work of breathing.  No wheezes, rales, rhonchi Heart- RRR, no murmurs, rubs or gallops, PMI not laterally displaced GI- soft, non-tender, non-distended, obese Extremities- no clubbing, cyanosis, or edema MS- no significant deformity or atrophy Skin- warm and dry, no rash or lesion, left chest without hematoma/ecchymosis Psych- euthymic mood, full affect Neuro- no gross defecits  Labs:   Lab Results  Component Value Date   WBC 5.0 01/09/2018   HGB 14.7 01/09/2018   HCT 40.0 01/09/2018   MCV 92 01/09/2018   PLT 192 01/09/2018   No results for input(s): NA, K, CL, CO2, BUN, CREATININE, CALCIUM, PROT, BILITOT, ALKPHOS, ALT, AST, GLUCOSE in the last 168 hours.  Invalid input(s): LABALBU  Discharge Medications:  Allergies as of 01/18/2018   No Known Allergies     Medication  List    TAKE these medications   acetaminophen 325 MG tablet Commonly known as:  TYLENOL Take 650 mg by mouth every 6 (six) hours as needed for mild pain or headache.   CENTRUM MEN Tabs Take 1 tablet by mouth daily.   metoprolol succinate 25 MG 24 hr tablet Commonly known as:  TOPROL-XL Take 3 tablets (75 mg total) by mouth daily.   potassium chloride SA 20 MEQ tablet Commonly  known as:  K-DUR,KLOR-CON Take 20 mEq by mouth daily.   rosuvastatin 5 MG tablet Commonly known as:  CRESTOR Take 1 tablet (5 mg total) by mouth daily.   sacubitril-valsartan 49-51 MG Commonly known as:  ENTRESTO Take 1 tablet by mouth 2 (two) times daily.   spironolactone 25 MG tablet Commonly known as:  ALDACTONE Take 1 tablet (25 mg total) by mouth daily.   torsemide 20 MG tablet Commonly known as:  DEMADEX Take 2 tablets (40 mg total) by mouth 2 (two) times daily.       Disposition:  Home  Discharge Instructions    Diet - low sodium heart healthy   Complete by:  As directed    Increase activity slowly   Complete by:  As directed      Follow-up Information    [facility] Office Follow up on 01/30/2018.   Specialty:  Cardiology Why:  2:00PM, wound check visit Contact information: [address] [place] [address] [phone]       [name], MD Follow up on 02/27/2018.   Specialty:  Cardiology Why:  2:40PM Contact information: [address] [place] [address] [phone]        Regan Lemming, MD Follow up on 04/23/2018.   Specialty:  Cardiology Why:  4:00PM Contact information: [address] [place] [address] [phone]           Duration of Discharge Encounter: Greater than 30 minutes including physician time.  SignedFrancis Dowse, PA-C 01/18/2018 10:25 AM    I have seen and examined this patient with Francis Dowse.  Agree with above, note added to reflect my findings.  On exam, RRR, no murmurs, lungs clear. ICD implanted for primary prevention. CXR and interrogation without issues. Plan for discharge with follow up in device clinic.    Will M. Camnitz MD 01/18/2018 12:13 PM  I have seen and examined this patient with Francis Dowse.  Agree with above, note added to reflect my findings.  On exam, RRR, no murmurs, lungs clear. ICD implanted for primary prevention.  CXR and interrogation without issue. Plan for discharge today with follow up in clinic.    Will M. Camnitz MD 01/18/2018 1:12 PM

## 2018-01-18 NOTE — Progress Notes (Signed)
HF MD: [facility]  HPI:  52 y.o. with history of nonischemic cardiomyopathy was referred by Dr. Harl Bowie for evaluation of CHF.  Patient had no cardiac history prior to 1/19.  However, starting at about 11/18, he began to develop profound exertional dyspnea.  This happened after a flu-like illness.  This was gradually progressive, and he ended up at [facility] in 1/19.  Echo showed EF 20-25% with some RV failure.  He was diuresed extensively and sent to followup with Dr. Harl Bowie.  In 2/19, he had a right heart cath showing no significant coronary disease but elevated left and right-sided filling pressures and pulmonary venous hypertension were noted on RHC.  Cardiac index was low.    Echo was done today, EF 30% with diffuse hypokinesis and normal RV.   Patient returns today with his wife for pharmacist-led HF medication titration. At his last HF clinic visit on 7/15, his metoprolol succinate was increased to 75 mg daily.He has felt well since these changes were made. He was implanted with an ICD yesterday and feels well today. No dyspnea walking on flat ground though he tires walking long distances.  . Shortness of breath/dyspnea on exertion? no  . Orthopnea/PND? no . Edema? no . Lightheadedness/dizziness? no . Daily weights at home? No - stable weights at MD office . Blood pressure/heart rate monitoring at home? Yes - 90-100/60-70 mmHg . Following low-sodium/fluid-restricted diet? yes  HF Medications: Metoprolol succinate 75 mg PO daily KCl 20 mEq PO daily Spironolactone 25 mg PO daily Torsemide 40 mg PO BID Entresto 49-51 mg PO BID  Has the patient been experiencing any side effects to the medications prescribed?  no  Does the patient have any problems obtaining medications due to transportation or finances?   No - [employer] commercial -- met deductible so all meds no cost  Understanding of regimen: good Understanding of indications: good Potential of compliance: good Patient  understands to avoid NSAIDs. Patient understands to avoid decongestants.    Pertinent Lab Values: . 01/09/18: Serum creatinine 1.72 (BL ~1.6-1.8), BUN 27, Potassium 3.6, Sodium 141  Vital Signs: . Weight: 400 lb (dry weight: 399 lb) . Blood pressure: 116/88 mmHg  . Heart rate: 75 bpm   Assessment: 1. Chronicsystolic CHF ([id number]%), due toNICM. NYHA classIIsymptoms. - Volume status stable - Increase Entresto to goal dose 97-103 mg BID cautiously with soft BP at home - Continue Toprol XL 75 mg daily, spironolactone 25 mg daily and torsemide 40 mg BID - Basic disease state pathophysiology, medication indication, mechanism and side effects reviewed at length with patient and he verbalized understanding 2. H/o bariatric surgery:Gastric sleeve.  -Stopped selenium for 1 year. ?if selenium deficiency could have contributed to CMP - Selenium level wnl on 09/06/17 -He needs to continue to work on weight loss.  3. LID:CVUDTHYH CPAP.  4. OOI:LNZVJ 3. Follow creatinine closely with medication titration. - SCr remains at BL (1.5-1.7) 5. Pulmonary hypertension: RHC showed pulmonary venous hypertension that will be treated by diuresis. No indication for selective pulmonary vasodilators.  Plan: 1) Medication changes: Based on clinical presentation, vital signs and recent labs will increase Entresto to goal 97-103 mg BID 2) Labs: BMET in 2 weeks 3) Follow-up: Pharmacy visit on 8/27 and Dr. Aundra Dubin on 9/24   Ruta Hinds. Velva Harman, PharmD, BCPS, CPP Clinical Pharmacist Phone: [phone] 01/18/2018 1:51 PM

## 2018-01-21 ENCOUNTER — Telehealth: Payer: Self-pay | Admitting: Internal Medicine

## 2018-01-21 NOTE — Telephone Encounter (Signed)
Cardiology Moonlighter Note  Returned page from patient. Recently increased his Entresto to 97-103mg . On his 3rd dose now. Was driving today and developed blurry vision. New symptom for him. Got home and developed lightheadedness. Checked blood pressure, found systolic pressure to be in 70's and heart rates around 100 bpm. Currently SBP 95 and HR in 80's. Symptoms now resolved.   I recommended that the patient go back to his prior dose of 49-51mg . He still has some of these tablets so he will resume the lower dose tonight. Understands that he should call back or come to ED if he has recurrent symptoms. Will cc pharmacist and cardiologist.  Rosario Jacks, MD Cardiology Fellow, PGY-6

## 2018-01-22 ENCOUNTER — Other Ambulatory Visit (HOSPITAL_COMMUNITY): Payer: Self-pay | Admitting: Pharmacist

## 2018-01-22 MED ORDER — SACUBITRIL-VALSARTAN 49-51 MG PO TABS
1.0000 | ORAL_TABLET | Freq: Two times a day (BID) | ORAL | 5 refills | Status: DC
Start: 2018-01-22 — End: 2018-08-01

## 2018-01-22 NOTE — Telephone Encounter (Signed)
Agree with decreasing Entresto to 49/51 bid. Please call in new dose for him.

## 2018-01-30 ENCOUNTER — Other Ambulatory Visit (HOSPITAL_COMMUNITY): Payer: Self-pay

## 2018-01-30 ENCOUNTER — Inpatient Hospital Stay (HOSPITAL_COMMUNITY)
Admission: RE | Admit: 2018-01-30 | Discharge: 2018-01-30 | Disposition: A | Discharge status: Home / Self Care | Payer: BLUE CROSS/BLUE SHIELD | Source: Ambulatory Visit

## 2018-01-30 ENCOUNTER — Ambulatory Visit (INDEPENDENT_AMBULATORY_CARE_PROVIDER_SITE_OTHER): Payer: BLUE CROSS/BLUE SHIELD | Admitting: *Deleted

## 2018-01-30 DIAGNOSIS — I5022 Chronic systolic (congestive) heart failure: Secondary | ICD-10-CM | POA: Diagnosis not present

## 2018-01-30 DIAGNOSIS — Z95810 Presence of automatic (implantable) cardiac defibrillator: Secondary | ICD-10-CM

## 2018-01-30 DIAGNOSIS — I428 Other cardiomyopathies: Secondary | ICD-10-CM

## 2018-01-30 LAB — CUP PACEART INCLINIC DEVICE CHECK
Battery Remaining Longevity: 126 mo
Brady Statistic AP VP Percent: 0.01 %
Brady Statistic AP VS Percent: 1.35 %
Brady Statistic AS VP Percent: 0.05 %
Brady Statistic AS VS Percent: 98.59 %
Brady Statistic RA Percent Paced: 1.34 %
Brady Statistic RV Percent Paced: 0.06 %
HIGH POWER IMPEDANCE MEASURED VALUE: 71 Ohm
Implantable Lead Implant Date: 20190814
Implantable Lead Location: 753859
Lead Channel Impedance Value: 494 Ohm
Lead Channel Pacing Threshold Amplitude: 0.75 V
Lead Channel Pacing Threshold Pulse Width: 0.4 ms
Lead Channel Sensing Intrinsic Amplitude: 3.875 mV
Lead Channel Setting Pacing Amplitude: 3.5 V
Lead Channel Setting Pacing Amplitude: 3.5 V
Lead Channel Setting Pacing Pulse Width: 0.4 ms
MDC IDC LEAD IMPLANT DT: 20190814
MDC IDC LEAD LOCATION: 753860
MDC IDC MSMT BATTERY VOLTAGE: 3.03 V
MDC IDC MSMT LEADCHNL RA PACING THRESHOLD AMPLITUDE: 0.75 V
MDC IDC MSMT LEADCHNL RA PACING THRESHOLD PULSEWIDTH: 0.4 ms
MDC IDC MSMT LEADCHNL RV IMPEDANCE VALUE: 342 Ohm
MDC IDC MSMT LEADCHNL RV IMPEDANCE VALUE: 437 Ohm
MDC IDC MSMT LEADCHNL RV SENSING INTR AMPL: 13 mV
MDC IDC PG IMPLANT DT: 20190814
MDC IDC SESS DTM: 20190827150245
MDC IDC SET LEADCHNL RV SENSING SENSITIVITY: 0.3 mV

## 2018-01-30 NOTE — Progress Notes (Signed)
Wound check appointment. Steri-strips removed. Wound without redness or edema. Incision edges approximated, wound well healed. Normal device function. Thresholds, sensing, and impedances consistent with implant measurements. Device programmed at 3.5V for extra safety margin until 3 month visit. Histogram distribution appropriate for patient and level of activity. No mode switches or ventricular arrhythmias noted. Patient educated about wound care, arm mobility, lifting restrictions, shock plan. ROV with WC on 04/23/18.

## 2018-01-31 MED ORDER — SPIRONOLACTONE 25 MG PO TABS: 25.0000 mg | ORAL_TABLET | Freq: Every day | ORAL | 1 refills | Status: DC

## 2018-02-02 NOTE — Telephone Encounter (Signed)
-----   Message from [name], MD sent at 01/22/2018  1:38 AM EDT -----   ----- Message ----- From: Rosario Jacks, MD Sent: 01/21/2018   7:08 PM EDT To: [name], MD, Elvin So, RPH-CPP

## 2018-02-06 ENCOUNTER — Inpatient Hospital Stay (HOSPITAL_COMMUNITY): Admission: RE | Admit: 2018-02-06 | Payer: BLUE CROSS/BLUE SHIELD | Source: Ambulatory Visit

## 2018-02-27 ENCOUNTER — Ambulatory Visit (HOSPITAL_COMMUNITY)
Admission: RE | Admit: 2018-02-27 | Discharge: 2018-02-27 | Disposition: A | Discharge status: Home / Self Care | Payer: BLUE CROSS/BLUE SHIELD | Source: Ambulatory Visit | Attending: Cardiology | Admitting: Cardiology

## 2018-02-27 VITALS — BP 136/80 | HR 61 | Wt >= 6400 oz

## 2018-02-27 DIAGNOSIS — N183 Chronic kidney disease, stage 3 unspecified: Secondary | ICD-10-CM

## 2018-02-27 DIAGNOSIS — Z6841 Body Mass Index (BMI) 40.0 and over, adult: Secondary | ICD-10-CM | POA: Diagnosis not present

## 2018-02-27 DIAGNOSIS — I5022 Chronic systolic (congestive) heart failure: Secondary | ICD-10-CM | POA: Insufficient documentation

## 2018-02-27 DIAGNOSIS — I428 Other cardiomyopathies: Secondary | ICD-10-CM | POA: Diagnosis not present

## 2018-02-27 DIAGNOSIS — Z86718 Personal history of other venous thrombosis and embolism: Secondary | ICD-10-CM | POA: Insufficient documentation

## 2018-02-27 DIAGNOSIS — E669 Obesity, unspecified: Secondary | ICD-10-CM | POA: Diagnosis not present

## 2018-02-27 DIAGNOSIS — Z79899 Other long term (current) drug therapy: Secondary | ICD-10-CM | POA: Diagnosis not present

## 2018-02-27 DIAGNOSIS — G4733 Obstructive sleep apnea (adult) (pediatric): Secondary | ICD-10-CM | POA: Diagnosis not present

## 2018-02-27 LAB — BASIC METABOLIC PANEL
ANION GAP: 8 (ref 5–15)
BUN: 18 mg/dL (ref 6–20)
CALCIUM: 8.7 mg/dL — AB (ref 8.9–10.3)
CO2: 29 mmol/L (ref 22–32)
Chloride: 104 mmol/L (ref 98–111)
Creatinine, Ser: 1.62 mg/dL — ABNORMAL HIGH (ref 0.61–1.24)
GFR, EST AFRICAN AMERICAN: 56 mL/min — AB (ref >60)
GFR, EST NON AFRICAN AMERICAN: 48 mL/min — AB (ref >60)
GLUCOSE: 87 mg/dL (ref 70–99)
POTASSIUM: 3.4 mmol/L — AB (ref 3.5–5.1)
SODIUM: 141 mmol/L (ref 135–145)

## 2018-02-27 MED ORDER — METOPROLOL SUCCINATE ER 100 MG PO TB24: 100.0000 mg | ORAL_TABLET | Freq: Every day | ORAL | 3 refills | Status: DC

## 2018-02-27 NOTE — Patient Instructions (Signed)
Labs today (will call for abnormal results, otherwise no news is good news)  INCREASE Toprol 100 mg Once Daily  Follow up in 3 months
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
## 2018-03-01 ENCOUNTER — Telehealth (HOSPITAL_COMMUNITY): Payer: Self-pay

## 2018-03-01 NOTE — Telephone Encounter (Signed)
Faxed lab orders to [facility] in [place] to be drawn in 2 weeks for a Bmet. Facility will return results to Dr. Shirlee Latch.

## 2018-03-09 ENCOUNTER — Other Ambulatory Visit: Payer: Self-pay | Admitting: Cardiology

## 2018-03-15 DIAGNOSIS — E1165 Type 2 diabetes mellitus with hyperglycemia: Secondary | ICD-10-CM | POA: Diagnosis not present

## 2018-03-15 DIAGNOSIS — K219 Gastro-esophageal reflux disease without esophagitis: Secondary | ICD-10-CM | POA: Diagnosis not present

## 2018-03-15 DIAGNOSIS — I10 Essential (primary) hypertension: Secondary | ICD-10-CM | POA: Diagnosis not present

## 2018-03-15 DIAGNOSIS — N183 Chronic kidney disease, stage 3 (moderate): Secondary | ICD-10-CM | POA: Diagnosis not present

## 2018-04-10 ENCOUNTER — Other Ambulatory Visit (HOSPITAL_COMMUNITY): Payer: Self-pay | Admitting: Cardiology

## 2018-04-23 ENCOUNTER — Encounter: Payer: BLUE CROSS/BLUE SHIELD | Admitting: Cardiology

## 2018-05-10 DIAGNOSIS — I10 Essential (primary) hypertension: Secondary | ICD-10-CM | POA: Diagnosis not present

## 2018-05-10 DIAGNOSIS — E782 Mixed hyperlipidemia: Secondary | ICD-10-CM | POA: Diagnosis not present

## 2018-05-10 DIAGNOSIS — G4733 Obstructive sleep apnea (adult) (pediatric): Secondary | ICD-10-CM | POA: Diagnosis not present

## 2018-05-10 DIAGNOSIS — Z9189 Other specified personal risk factors, not elsewhere classified: Secondary | ICD-10-CM | POA: Diagnosis not present

## 2018-05-10 DIAGNOSIS — E1165 Type 2 diabetes mellitus with hyperglycemia: Secondary | ICD-10-CM | POA: Diagnosis not present

## 2018-05-10 DIAGNOSIS — I5022 Chronic systolic (congestive) heart failure: Secondary | ICD-10-CM | POA: Diagnosis not present

## 2018-05-16 DIAGNOSIS — Z23 Encounter for immunization: Secondary | ICD-10-CM | POA: Diagnosis not present

## 2018-05-16 DIAGNOSIS — I10 Essential (primary) hypertension: Secondary | ICD-10-CM | POA: Diagnosis not present

## 2018-05-16 DIAGNOSIS — I5042 Chronic combined systolic (congestive) and diastolic (congestive) heart failure: Secondary | ICD-10-CM | POA: Diagnosis not present

## 2018-05-16 DIAGNOSIS — E782 Mixed hyperlipidemia: Secondary | ICD-10-CM | POA: Diagnosis not present

## 2018-05-22 ENCOUNTER — Ambulatory Visit (HOSPITAL_COMMUNITY)
Admission: RE | Admit: 2018-05-22 | Discharge: 2018-05-22 | Disposition: A | Discharge status: Home / Self Care | Payer: BLUE CROSS/BLUE SHIELD | Source: Ambulatory Visit | Attending: Cardiology | Admitting: Cardiology

## 2018-05-22 DIAGNOSIS — Z79899 Other long term (current) drug therapy: Secondary | ICD-10-CM | POA: Diagnosis not present

## 2018-05-22 DIAGNOSIS — I5022 Chronic systolic (congestive) heart failure: Secondary | ICD-10-CM | POA: Diagnosis not present

## 2018-05-22 LAB — BASIC METABOLIC PANEL
Anion gap: 9 (ref 5–15)
BUN: 21 mg/dL — AB (ref 6–20)
CHLORIDE: 104 mmol/L (ref 98–111)
CO2: 29 mmol/L (ref 22–32)
Calcium: 8.9 mg/dL (ref 8.9–10.3)
Creatinine, Ser: 1.6 mg/dL — ABNORMAL HIGH (ref 0.61–1.24)
GFR calc non Af Amer: 49 mL/min — ABNORMAL LOW (ref >60)
GFR, EST AFRICAN AMERICAN: 57 mL/min — AB (ref >60)
Glucose, Bld: 117 mg/dL — ABNORMAL HIGH (ref 70–99)
Potassium: 4.1 mmol/L (ref 3.5–5.1)
SODIUM: 142 mmol/L (ref 135–145)

## 2018-05-22 MED ORDER — METOPROLOL SUCCINATE ER 50 MG PO TB24: ORAL_TABLET | ORAL | 3 refills | Status: DC

## 2018-05-22 NOTE — Patient Instructions (Signed)
INCREASE Toprol XL 100mg  (2 tabs) in the morning and Toprol XL 50mg  in the evening (1 tab)   Labs today We will only contact you if something comes back abnormal or we need to make some changes. Otherwise no news is good news!  Your physician recommends that you schedule a follow-up appointment in: 3 months
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
## 2018-05-24 NOTE — Progress Notes (Signed)
Cardiology Office Note Date:  05/25/2018  Patient ID:  Mitchell LimesCharles W Shakespeare Jr., DOB 03-16-1966, MRN [id number] PCP:  Richardean Chimeraaniel, Terry G, MD  Cardiologist:  Dr. Wyline MoodBranch AHF: Dr. Shirlee LatchMcLean Electrophysiologist: Dr. Elberta Fortiscamnitz    Chief Complaint: 91 day device visit (rescheduled from November)  History of Present Illness: Mitchell LimesCharles W Ouderkirk Jr. is a 52 y.o. 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  He comes today to be seen for Dr. Elberta Fortisamnitz.  He last saw him POD #1 after his device implant.  Most recently he saw Dr. Shirlee LatchMcLean 05/22/18, he noted CPX in 4/19 showed that limitation is primarily due to body habitus rather than CHF.  Echo in 7/19 showed that EF remains low at 30% with diffuse hypokinesis, with stable NYHA class II HF.  His Toprol was increased  He reports feeling "great!"  Denies any pain at his site, no CP, palpitations, no rest SON, minimal DOE at his baseline.  He wears his CPAP nightly, denies any symptoms of PND or orthopnea, no dizziness, near syncope or syncope.  He does not weigh daily, though will (he mentions dr. Shirlee LatchMclean d/w him too).  He does not perceive any fluid retention.  Device information MDT dual chamber ICD, implanted 01/17/18    Past Medical History:  Diagnosis Date  . Dilated cardiomyopathy (HCC)   . Pulmonary hypertension (HCC)     Past Surgical History:  Procedure Laterality Date  . BARIATRIC SURGERY    . ICD IMPLANT N/A 01/17/2018   Procedure: ICD IMPLANT;  Surgeon: Regan Lemmingamnitz, Will Martin, MD;  [facility];  Service: Cardiovascular;  Laterality: N/A;  . RIGHT/LEFT HEART CATH AND CORONARY ANGIOGRAPHY N/A 07/31/2017   Procedure: RIGHT/LEFT HEART CATH AND CORONARY ANGIOGRAPHY;  Surgeon: Yvonne KendallEnd, Christopher, MD;  Location: [facility]VAS[facility] CV LAB;  Service: Cardiovascular;  Laterality: N/A;  .  UMBILICAL HERNIA REPAIR      Current Outpatient Medications  Medication Sig Dispense Refill  . acetaminophen (TYLENOL) 325 MG tablet Take 650 mg by mouth every 6 (six) hours as needed for mild pain or headache.     . metoprolol succinate (TOPROL-XL) 50 MG 24 hr tablet Take 2 tablets (100 mg total) by mouth every morning AND 1 tablet (50 mg total) every evening. Take with or immediately following a meal.. 90 tablet 3  . Multiple Vitamins-Minerals (CENTRUM MEN) TABS Take 1 tablet by mouth daily.    . potassium chloride SA (K-DUR,KLOR-CON) 20 MEQ tablet Take 30 mEq by mouth once.    . rosuvastatin (CRESTOR) 5 MG tablet TAKE 1 TABLET BY MOUTH EVERY DAY 30 tablet 5  . sacubitril-valsartan (ENTRESTO) 49-51 MG Take 1 tablet by mouth 2 (two) times daily. 60 tablet 5  . spironolactone (ALDACTONE) 25 MG tablet Take 1 tablet (25 mg total) by mouth daily. 90 tablet 1  . torsemide (DEMADEX) 20 MG tablet Take 2 tablets (40 mg total) by mouth 2 (two) times daily. 360 tablet 3   No current facility-administered medications for this visit.     Allergies:   Patient has no known allergies.   Social History:  The patient  reports that he has never smoked. He has never used smokeless tobacco. He reports that he does not drink alcohol or use drugs.   Family History:  The patient's family history includes Diabetes type II in  his brother and father; Heart failure in his mother; Hypertension in his mother; Lung cancer in his mother.  ROS:  Please see the history of present illness.  All other systems are reviewed and otherwise negative.   PHYSICAL EXAM:  VS:  BP 104/78   Pulse 66   Ht 6\' 2"  (1.88 m)   Wt (!) 415 lb (188.2 kg)   BMI 53.28 kg/m  BMI: Body mass index is 53.28 kg/m. Well nourished, well developed, morbidly obese in no acute distress  HEENT: normocephalic, atraumatic  Neck: no JVD, carotid bruits or masses Cardiac:  RRR; no significant murmurs, no rubs, or gallops Lungs:  CTA b/l, no  wheezing, rhonchi or rales  Abd: soft, nontender MS: no deformity or atrophy Ext: no edema  Skin: warm and dry, no rash Neuro:  No gross deficits appreciated Psych: euthymic mood, full affect  ICD site is stable, no tethering or discomfort, well healed   EKG:  SR, PVC, iCLBBB, QRS ICD interrogation done today and reviewed by myself: battery and lead measurements are good. He is already programmed at chronic lead outputs.  No episodes/observations, <0.1% VP   12/18/17: TTE Study Conclusions - Left ventricle: The cavity size was mildly dilated. Wall   thickness was normal. The estimated ejection fraction was 30%.   Diffuse hypokinesis. Features are consistent with a pseudonormal   left ventricular filling pattern, with concomitant abnormal   relaxation and increased filling pressure (grade 2 diastolic   dysfunction). - Aortic valve: There was no stenosis. - Aorta: Mildly dilated aortic root. Aortic root dimension: 41 mm   (ED). - Mitral valve: There was mild regurgitation. - Left atrium: The atrium was mildly dilated. - Right ventricle: The cavity size was normal. Systolic function   was normal. - Pulmonary arteries: No complete TR doppler jet so unable to   estimate PA systolic pressure. - Systemic veins: IVC not visualized. Impressions: - Mildly dilated LV with EF 30%, diffuse hypokinesis. Moderate   diastolic dysfunction. Normal RV size and systolic function. Mild   MR.   07/31/17: R/LHC Conclusions: 1. No angiographically significant coronary artery disease, consistent with non-ischemic cardiomyopathy. 2. Moderately elevated right heart filling pressures. 3. Moderately to severely elevated left heart filling pressures. 4. Severe pulmonary hypertension. 5. Low Fick cardiac output/index.    Recent Labs: 01/09/2018: Hemoglobin 14.7; Platelets 192 05/22/2018: BUN 21; Creatinine, Ser 1.60; Potassium 4.1; Sodium 142  No results found for requested labs within last  8760 hours.   Estimated Creatinine Clearance: 97.3 mL/min (A) (by C-G formula based on SCr of 1.6 mg/dL (H)).   Wt Readings from Last 3 Encounters:  05/25/18 (!) 415 lb (188.2 kg)  05/22/18 (!) 417 lb (189.1 kg)  02/27/18 (!) 408 lb 9.6 oz (185.3 kg)     Other studies reviewed: Additional studies/records reviewed today include: summarized above  ASSESSMENT AND PLAN:  1. ICD     Intact function     He is aware of the shock plan  2. NICM 3. Chronic CHF (systolic), NYHA class II     No symptoms or exam findings to suggest fluid OL     OptiVol looks good     On BB, entresto, diuretics     He feels well, following closely with Dr. Shirlee Latch   Disposition: F/u with remotes every 3 months, in-clinic in 1 year, sooner if needed  Current medicines are reviewed at length with the patient today.  The patient did not have any concerns regarding  medicines.  Norma Fredrickson, PA-C 05/25/2018 10:06 AM     [facility] [address] [place] [address] [phone] (office)  [phone] (fax)

## 2018-05-25 ENCOUNTER — Encounter: Payer: Self-pay | Admitting: Physician Assistant

## 2018-05-25 ENCOUNTER — Ambulatory Visit (INDEPENDENT_AMBULATORY_CARE_PROVIDER_SITE_OTHER): Payer: BLUE CROSS/BLUE SHIELD | Admitting: Physician Assistant

## 2018-05-25 VITALS — BP 104/78 | HR 66 | Ht 74.0 in | Wt >= 6400 oz

## 2018-05-25 DIAGNOSIS — Z95810 Presence of automatic (implantable) cardiac defibrillator: Secondary | ICD-10-CM

## 2018-05-25 DIAGNOSIS — I5022 Chronic systolic (congestive) heart failure: Secondary | ICD-10-CM | POA: Diagnosis not present

## 2018-05-25 DIAGNOSIS — I428 Other cardiomyopathies: Secondary | ICD-10-CM | POA: Diagnosis not present

## 2018-05-25 NOTE — Patient Instructions (Addendum)
.  Medication Instructions:   Your physician recommends that you continue on your current medications as directed. Please refer to the Current Medication list given to you today.  If you need a refill on your cardiac medications before your next appointment, please call your pharmacy.   Lab work: NONE ORDERED  TODAY    If you have labs (blood work) drawn today and your tests are completely normal, you will receive your results only by: Marland Kitchen MyChart Message (if you have MyChart) OR . A paper copy in the mail If you have any lab test that is abnormal or we need to change your treatment, we will call you to review the results.  Testing/Procedures:  NONE ORDERED  TODAY   Follow-Up: At [facility], you and your health needs are our priority.  As part of our continuing mission to provide you with exceptional heart care, we have created designated Provider Care Teams.  These Care Teams include your primary Cardiologist (physician) and Advanced Practice Providers (APPs -  Physician Assistants and Nurse Practitioners) who all work together to provide you with the care you need, when you need it. You will need a follow up appointment in 1 years.  Please call our office 2 months in advance to schedule this appointment.  You may see Will Jorja Loa, MD  or one of the following Advanced Practice Providers on your designated Care Team:   Gypsy Balsam, NP . Francis Dowse, PA-C  Remote monitoring is used to monitor your Pacemaker of ICD from home. This monitoring reduces the number of office visits required to check your device to one time per year. It allows [place] to keep an eye on the functioning of your device to ensure it is working properly. You are scheduled for a device check from home on . 08-27-18.Marland KitchenYou may send your transmission at any time that day. If you have a wireless device, the transmission will be sent automatically. After your physician reviews your transmission, you will receive a postcard  with your next transmission date.      Any Other Special Instructions Will Be Listed Below (If Applicable).

## 2018-07-13 ENCOUNTER — Other Ambulatory Visit (HOSPITAL_COMMUNITY): Payer: Self-pay | Admitting: Cardiology

## 2018-07-31 ENCOUNTER — Other Ambulatory Visit: Payer: Self-pay | Admitting: Cardiology

## 2018-07-31 NOTE — Telephone Encounter (Signed)
This is a CHF pt 

## 2018-08-01 ENCOUNTER — Other Ambulatory Visit (HOSPITAL_COMMUNITY): Payer: Self-pay

## 2018-08-01 MED ORDER — SACUBITRIL-VALSARTAN 49-51 MG PO TABS: 1.0000 | ORAL_TABLET | Freq: Two times a day (BID) | ORAL | 3 refills | Status: DC

## 2018-08-01 NOTE — Telephone Encounter (Signed)
pts wife called stating that they are having challenges paying monthly co pay for entresto. Advised can pick up 10$ co pay card.  Will pick up today

## 2018-08-14 ENCOUNTER — Encounter (HOSPITAL_COMMUNITY): Payer: Self-pay | Admitting: Cardiology

## 2018-08-14 ENCOUNTER — Other Ambulatory Visit: Payer: Self-pay

## 2018-08-14 ENCOUNTER — Ambulatory Visit (HOSPITAL_COMMUNITY)
Admission: RE | Admit: 2018-08-14 | Discharge: 2018-08-14 | Disposition: A | Discharge status: Home / Self Care | Payer: BLUE CROSS/BLUE SHIELD | Source: Ambulatory Visit | Attending: Cardiology | Admitting: Cardiology

## 2018-08-14 VITALS — BP 118/82 | HR 64 | Wt >= 6400 oz

## 2018-08-14 DIAGNOSIS — G4733 Obstructive sleep apnea (adult) (pediatric): Secondary | ICD-10-CM | POA: Diagnosis not present

## 2018-08-14 DIAGNOSIS — N183 Chronic kidney disease, stage 3 (moderate): Secondary | ICD-10-CM | POA: Insufficient documentation

## 2018-08-14 DIAGNOSIS — Z7901 Long term (current) use of anticoagulants: Secondary | ICD-10-CM | POA: Insufficient documentation

## 2018-08-14 DIAGNOSIS — Z79899 Other long term (current) drug therapy: Secondary | ICD-10-CM | POA: Diagnosis not present

## 2018-08-14 DIAGNOSIS — I272 Pulmonary hypertension, unspecified: Secondary | ICD-10-CM | POA: Diagnosis not present

## 2018-08-14 DIAGNOSIS — Z86718 Personal history of other venous thrombosis and embolism: Secondary | ICD-10-CM | POA: Diagnosis not present

## 2018-08-14 DIAGNOSIS — I5022 Chronic systolic (congestive) heart failure: Secondary | ICD-10-CM | POA: Diagnosis not present

## 2018-08-14 DIAGNOSIS — Z8249 Family history of ischemic heart disease and other diseases of the circulatory system: Secondary | ICD-10-CM | POA: Diagnosis not present

## 2018-08-14 LAB — LIPID PANEL
Cholesterol: 166 mg/dL (ref 0–200)
HDL: 44 mg/dL (ref >40)
LDL CALC: 80 mg/dL (ref 0–99)
Total CHOL/HDL Ratio: 3.8 RATIO
Triglycerides: 209 mg/dL — ABNORMAL HIGH (ref <150)
VLDL: 42 mg/dL — ABNORMAL HIGH (ref 0–40)

## 2018-08-14 LAB — BASIC METABOLIC PANEL
Anion gap: 9 (ref 5–15)
BUN: 17 mg/dL (ref 6–20)
CALCIUM: 8.8 mg/dL — AB (ref 8.9–10.3)
CO2: 27 mmol/L (ref 22–32)
CREATININE: 1.54 mg/dL — AB (ref 0.61–1.24)
Chloride: 105 mmol/L (ref 98–111)
GFR calc Af Amer: >60 mL/min (ref >60)
GFR, EST NON AFRICAN AMERICAN: 52 mL/min — AB (ref >60)
GLUCOSE: 83 mg/dL (ref 70–99)
Potassium: 3.6 mmol/L (ref 3.5–5.1)
Sodium: 141 mmol/L (ref 135–145)

## 2018-08-14 MED ORDER — METOPROLOL SUCCINATE ER 100 MG PO TB24: ORAL_TABLET | ORAL | 3 refills | Status: DC
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
## 2018-08-14 NOTE — Patient Instructions (Addendum)
Labs were done today. We will call you with any ABNORMAL results. No news is good news!  INCREASE Toprol-XL to 100 mg twice a day. This prescription was sent to your pharmacy.   Your physician has requested that you have an echocardiogram. Echocardiography is a painless test that uses sound waves to create images of your heart. It provides your doctor with information about the size and shape of your heart and how well your heart's chambers and valves are working. This procedure takes approximately one hour. There are no restrictions for this procedure.  Your physician recommends that you schedule a follow-up appointment in: 3 MONTHS with your ECHO.

## 2018-08-27 ENCOUNTER — Ambulatory Visit (INDEPENDENT_AMBULATORY_CARE_PROVIDER_SITE_OTHER): Payer: BLUE CROSS/BLUE SHIELD | Admitting: *Deleted

## 2018-08-27 ENCOUNTER — Other Ambulatory Visit: Payer: Self-pay

## 2018-08-27 DIAGNOSIS — I5022 Chronic systolic (congestive) heart failure: Secondary | ICD-10-CM

## 2018-08-27 DIAGNOSIS — I428 Other cardiomyopathies: Secondary | ICD-10-CM

## 2018-08-28 LAB — CUP PACEART REMOTE DEVICE CHECK
Battery Remaining Longevity: 119 mo
Battery Voltage: 3.01 V
Brady Statistic AP VP Percent: 0.01 %
Brady Statistic AP VS Percent: 1.54 %
Brady Statistic AS VP Percent: 0.04 %
Brady Statistic AS VS Percent: 98.42 %
HighPow Impedance: 81 Ohm
Implantable Lead Implant Date: 20190814
Implantable Lead Implant Date: 20190814
Lead Channel Impedance Value: 437 Ohm
Lead Channel Pacing Threshold Amplitude: 0.875 V
Lead Channel Pacing Threshold Pulse Width: 0.4 ms
Lead Channel Pacing Threshold Pulse Width: 0.4 ms
Lead Channel Sensing Intrinsic Amplitude: 12.5 mV
Lead Channel Sensing Intrinsic Amplitude: 12.5 mV
Lead Channel Sensing Intrinsic Amplitude: 3.375 mV
Lead Channel Setting Pacing Amplitude: 2 V
Lead Channel Setting Pacing Amplitude: 2.5 V
Lead Channel Setting Pacing Pulse Width: 0.4 ms
MDC IDC LEAD LOCATION: 753859
MDC IDC LEAD LOCATION: 753860
MDC IDC MSMT LEADCHNL RA SENSING INTR AMPL: 3.375 mV
MDC IDC MSMT LEADCHNL RV IMPEDANCE VALUE: 342 Ohm
MDC IDC MSMT LEADCHNL RV IMPEDANCE VALUE: 456 Ohm
MDC IDC MSMT LEADCHNL RV PACING THRESHOLD AMPLITUDE: 0.625 V
MDC IDC PG IMPLANT DT: 20190814
MDC IDC SESS DTM: 20200323083723
MDC IDC SET LEADCHNL RV SENSING SENSITIVITY: 0.3 mV
MDC IDC STAT BRADY RA PERCENT PACED: 1.52 %
MDC IDC STAT BRADY RV PERCENT PACED: 0.04 %

## 2018-09-04 NOTE — Progress Notes (Signed)
Remote ICD transmission.   

## 2018-09-10 DIAGNOSIS — R220 Localized swelling, mass and lump, head: Secondary | ICD-10-CM | POA: Diagnosis not present

## 2018-09-10 DIAGNOSIS — Z6841 Body Mass Index (BMI) 40.0 and over, adult: Secondary | ICD-10-CM | POA: Diagnosis not present

## 2018-09-10 DIAGNOSIS — K047 Periapical abscess without sinus: Secondary | ICD-10-CM | POA: Diagnosis not present

## 2018-09-11 DIAGNOSIS — I10 Essential (primary) hypertension: Secondary | ICD-10-CM | POA: Diagnosis not present

## 2018-09-11 DIAGNOSIS — E782 Mixed hyperlipidemia: Secondary | ICD-10-CM | POA: Diagnosis not present

## 2018-09-11 DIAGNOSIS — E1165 Type 2 diabetes mellitus with hyperglycemia: Secondary | ICD-10-CM | POA: Diagnosis not present

## 2018-09-11 DIAGNOSIS — N183 Chronic kidney disease, stage 3 (moderate): Secondary | ICD-10-CM | POA: Diagnosis not present

## 2018-09-30 ENCOUNTER — Other Ambulatory Visit (HOSPITAL_COMMUNITY): Payer: Self-pay | Admitting: Cardiology

## 2018-11-08 ENCOUNTER — Telehealth (HOSPITAL_COMMUNITY): Payer: Self-pay | Admitting: Cardiology

## 2018-11-08 NOTE — Telephone Encounter (Signed)
Called and left message advising pt d/t Covid restrictions we are cancelling 11/15/2018 appt with Dr. Shirlee Latch; adding pt to waitlist.  Advised pt if he has any problems to call the Clinic and choose option 2 to leave a message for the Nurse.

## 2018-11-15 ENCOUNTER — Encounter (HOSPITAL_COMMUNITY): Payer: BLUE CROSS/BLUE SHIELD | Admitting: Cardiology

## 2018-11-26 ENCOUNTER — Ambulatory Visit (INDEPENDENT_AMBULATORY_CARE_PROVIDER_SITE_OTHER): Payer: BLUE CROSS/BLUE SHIELD | Admitting: *Deleted

## 2018-11-26 DIAGNOSIS — I428 Other cardiomyopathies: Secondary | ICD-10-CM | POA: Diagnosis not present

## 2018-11-26 DIAGNOSIS — I5022 Chronic systolic (congestive) heart failure: Secondary | ICD-10-CM

## 2018-11-26 LAB — CUP PACEART REMOTE DEVICE CHECK
Battery Remaining Longevity: 117 mo
Battery Voltage: 3.01 V
Brady Statistic AP VP Percent: 0.01 %
Brady Statistic AP VS Percent: 0.73 %
Brady Statistic AS VP Percent: 0.04 %
Brady Statistic AS VS Percent: 99.22 %
Brady Statistic RA Percent Paced: 0.73 %
Brady Statistic RV Percent Paced: 0.05 %
Date Time Interrogation Session: 20200622062703
HighPow Impedance: 83 Ohm
Implantable Lead Implant Date: 20190814
Implantable Lead Implant Date: 20190814
Implantable Lead Location: 753859
Implantable Lead Location: 753860
Implantable Lead Model: 5076
Implantable Pulse Generator Implant Date: 20190814
Lead Channel Impedance Value: 380 Ohm
Lead Channel Impedance Value: 437 Ohm
Lead Channel Impedance Value: 532 Ohm
Lead Channel Pacing Threshold Amplitude: 0.625 V
Lead Channel Pacing Threshold Amplitude: 0.875 V
Lead Channel Pacing Threshold Pulse Width: 0.4 ms
Lead Channel Pacing Threshold Pulse Width: 0.4 ms
Lead Channel Sensing Intrinsic Amplitude: 12.625 mV
Lead Channel Sensing Intrinsic Amplitude: 12.625 mV
Lead Channel Sensing Intrinsic Amplitude: 3.375 mV
Lead Channel Sensing Intrinsic Amplitude: 3.375 mV
Lead Channel Setting Pacing Amplitude: 1.75 V
Lead Channel Setting Pacing Amplitude: 2.5 V
Lead Channel Setting Pacing Pulse Width: 0.4 ms
Lead Channel Setting Sensing Sensitivity: 0.3 mV

## 2018-12-05 NOTE — Progress Notes (Signed)
Remote ICD transmission.   

## 2019-01-27 ENCOUNTER — Other Ambulatory Visit (HOSPITAL_COMMUNITY): Payer: Self-pay | Admitting: Cardiology

## 2019-02-26 ENCOUNTER — Ambulatory Visit (INDEPENDENT_AMBULATORY_CARE_PROVIDER_SITE_OTHER): Payer: BLUE CROSS/BLUE SHIELD | Admitting: *Deleted

## 2019-02-26 DIAGNOSIS — I428 Other cardiomyopathies: Secondary | ICD-10-CM | POA: Diagnosis not present

## 2019-02-26 DIAGNOSIS — I5022 Chronic systolic (congestive) heart failure: Secondary | ICD-10-CM

## 2019-02-26 LAB — CUP PACEART REMOTE DEVICE CHECK
Battery Remaining Longevity: 114 mo
Battery Voltage: 3 V
Brady Statistic AP VP Percent: 0.01 %
Brady Statistic AP VS Percent: 1.63 %
Brady Statistic AS VP Percent: 0.03 %
Brady Statistic AS VS Percent: 98.33 %
Brady Statistic RA Percent Paced: 1.62 %
Brady Statistic RV Percent Paced: 0.04 %
Date Time Interrogation Session: 20200922073422
HighPow Impedance: 91 Ohm
Implantable Lead Implant Date: 20190814
Implantable Lead Implant Date: 20190814
Implantable Lead Location: 753859
Implantable Lead Location: 753860
Implantable Lead Model: 5076
Implantable Pulse Generator Implant Date: 20190814
Lead Channel Impedance Value: 342 Ohm
Lead Channel Impedance Value: 456 Ohm
Lead Channel Impedance Value: 532 Ohm
Lead Channel Pacing Threshold Amplitude: 0.75 V
Lead Channel Pacing Threshold Amplitude: 0.875 V
Lead Channel Pacing Threshold Pulse Width: 0.4 ms
Lead Channel Pacing Threshold Pulse Width: 0.4 ms
Lead Channel Sensing Intrinsic Amplitude: 13.5 mV
Lead Channel Sensing Intrinsic Amplitude: 13.5 mV
Lead Channel Sensing Intrinsic Amplitude: 3.625 mV
Lead Channel Sensing Intrinsic Amplitude: 3.625 mV
Lead Channel Setting Pacing Amplitude: 2 V
Lead Channel Setting Pacing Amplitude: 2.5 V
Lead Channel Setting Pacing Pulse Width: 0.4 ms
Lead Channel Setting Sensing Sensitivity: 0.3 mV

## 2019-03-07 NOTE — Progress Notes (Signed)
Remote ICD transmission.   

## 2019-04-01 DIAGNOSIS — Z20828 Contact with and (suspected) exposure to other viral communicable diseases: Secondary | ICD-10-CM | POA: Diagnosis not present

## 2019-04-01 DIAGNOSIS — R509 Fever, unspecified: Secondary | ICD-10-CM | POA: Diagnosis not present

## 2019-04-10 ENCOUNTER — Other Ambulatory Visit (HOSPITAL_COMMUNITY): Payer: Self-pay | Admitting: Cardiology

## 2019-05-20 DIAGNOSIS — J069 Acute upper respiratory infection, unspecified: Secondary | ICD-10-CM | POA: Diagnosis not present

## 2019-05-20 DIAGNOSIS — U071 COVID-19: Secondary | ICD-10-CM | POA: Diagnosis not present

## 2019-05-28 ENCOUNTER — Ambulatory Visit (INDEPENDENT_AMBULATORY_CARE_PROVIDER_SITE_OTHER): Payer: BLUE CROSS/BLUE SHIELD | Admitting: *Deleted

## 2019-05-28 DIAGNOSIS — I428 Other cardiomyopathies: Secondary | ICD-10-CM | POA: Diagnosis not present

## 2019-05-28 LAB — CUP PACEART REMOTE DEVICE CHECK
Battery Remaining Longevity: 111 mo
Battery Voltage: 3 V
Brady Statistic AP VP Percent: 0 %
Brady Statistic AP VS Percent: 0.76 %
Brady Statistic AS VP Percent: 0.04 %
Brady Statistic AS VS Percent: 99.2 %
Brady Statistic RA Percent Paced: 0.76 %
Brady Statistic RV Percent Paced: 0.04 %
Date Time Interrogation Session: 20201222022724
HighPow Impedance: 93 Ohm
Implantable Lead Implant Date: 20190814
Implantable Lead Implant Date: 20190814
Implantable Lead Location: 753859
Implantable Lead Location: 753860
Implantable Lead Model: 5076
Implantable Pulse Generator Implant Date: 20190814
Lead Channel Impedance Value: 380 Ohm
Lead Channel Impedance Value: 437 Ohm
Lead Channel Impedance Value: 532 Ohm
Lead Channel Pacing Threshold Amplitude: 0.875 V
Lead Channel Pacing Threshold Amplitude: 0.875 V
Lead Channel Pacing Threshold Pulse Width: 0.4 ms
Lead Channel Pacing Threshold Pulse Width: 0.4 ms
Lead Channel Sensing Intrinsic Amplitude: 13.5 mV
Lead Channel Sensing Intrinsic Amplitude: 13.5 mV
Lead Channel Sensing Intrinsic Amplitude: 3 mV
Lead Channel Sensing Intrinsic Amplitude: 3 mV
Lead Channel Setting Pacing Amplitude: 1.75 V
Lead Channel Setting Pacing Amplitude: 2.5 V
Lead Channel Setting Pacing Pulse Width: 0.4 ms
Lead Channel Setting Sensing Sensitivity: 0.3 mV

## 2019-08-05 ENCOUNTER — Other Ambulatory Visit (HOSPITAL_COMMUNITY): Payer: Self-pay | Admitting: Cardiology

## 2019-08-27 ENCOUNTER — Ambulatory Visit (INDEPENDENT_AMBULATORY_CARE_PROVIDER_SITE_OTHER): Payer: BLUE CROSS/BLUE SHIELD | Admitting: *Deleted

## 2019-08-27 DIAGNOSIS — I428 Other cardiomyopathies: Secondary | ICD-10-CM

## 2019-08-27 LAB — CUP PACEART REMOTE DEVICE CHECK
Battery Remaining Longevity: 107 mo
Battery Voltage: 2.99 V
Brady Statistic AP VP Percent: 0.01 %
Brady Statistic AP VS Percent: 1.55 %
Brady Statistic AS VP Percent: 0.03 %
Brady Statistic AS VS Percent: 98.41 %
Brady Statistic RA Percent Paced: 1.54 %
Brady Statistic RV Percent Paced: 0.04 %
Date Time Interrogation Session: 20210323023323
HighPow Impedance: 89 Ohm
Implantable Lead Implant Date: 20190814
Implantable Lead Implant Date: 20190814
Implantable Lead Location: 753859
Implantable Lead Location: 753860
Implantable Lead Model: 5076
Implantable Pulse Generator Implant Date: 20190814
Lead Channel Impedance Value: 342 Ohm
Lead Channel Impedance Value: 456 Ohm
Lead Channel Impedance Value: 513 Ohm
Lead Channel Pacing Threshold Amplitude: 0.75 V
Lead Channel Pacing Threshold Amplitude: 0.75 V
Lead Channel Pacing Threshold Pulse Width: 0.4 ms
Lead Channel Pacing Threshold Pulse Width: 0.4 ms
Lead Channel Sensing Intrinsic Amplitude: 12.625 mV
Lead Channel Sensing Intrinsic Amplitude: 12.625 mV
Lead Channel Sensing Intrinsic Amplitude: 3.375 mV
Lead Channel Sensing Intrinsic Amplitude: 3.375 mV
Lead Channel Setting Pacing Amplitude: 1.75 V
Lead Channel Setting Pacing Amplitude: 2.5 V
Lead Channel Setting Pacing Pulse Width: 0.4 ms
Lead Channel Setting Sensing Sensitivity: 0.3 mV

## 2019-08-28 NOTE — Progress Notes (Signed)
ICD Remote  

## 2019-10-17 DIAGNOSIS — Z23 Encounter for immunization: Secondary | ICD-10-CM | POA: Diagnosis not present

## 2019-10-22 ENCOUNTER — Other Ambulatory Visit: Payer: Self-pay | Admitting: Cardiology

## 2019-11-26 ENCOUNTER — Ambulatory Visit (INDEPENDENT_AMBULATORY_CARE_PROVIDER_SITE_OTHER): Payer: BLUE CROSS/BLUE SHIELD | Admitting: *Deleted

## 2019-11-26 DIAGNOSIS — I428 Other cardiomyopathies: Secondary | ICD-10-CM

## 2019-11-26 DIAGNOSIS — I5022 Chronic systolic (congestive) heart failure: Secondary | ICD-10-CM

## 2019-11-26 LAB — CUP PACEART REMOTE DEVICE CHECK
Battery Remaining Longevity: 101 mo
Battery Voltage: 2.99 V
Brady Statistic AP VP Percent: 0.01 %
Brady Statistic AP VS Percent: 1.6 %
Brady Statistic AS VP Percent: 0.04 %
Brady Statistic AS VS Percent: 98.35 %
Brady Statistic RA Percent Paced: 1.59 %
Brady Statistic RV Percent Paced: 0.04 %
Date Time Interrogation Session: 20210622033324
HighPow Impedance: 89 Ohm
Implantable Lead Implant Date: 20190814
Implantable Lead Implant Date: 20190814
Implantable Lead Location: 753859
Implantable Lead Location: 753860
Implantable Lead Model: 5076
Implantable Pulse Generator Implant Date: 20190814
Lead Channel Impedance Value: 342 Ohm
Lead Channel Impedance Value: 456 Ohm
Lead Channel Impedance Value: 513 Ohm
Lead Channel Pacing Threshold Amplitude: 0.75 V
Lead Channel Pacing Threshold Amplitude: 0.875 V
Lead Channel Pacing Threshold Pulse Width: 0.4 ms
Lead Channel Pacing Threshold Pulse Width: 0.4 ms
Lead Channel Sensing Intrinsic Amplitude: 14.25 mV
Lead Channel Sensing Intrinsic Amplitude: 14.25 mV
Lead Channel Sensing Intrinsic Amplitude: 3.25 mV
Lead Channel Sensing Intrinsic Amplitude: 3.25 mV
Lead Channel Setting Pacing Amplitude: 1.5 V
Lead Channel Setting Pacing Amplitude: 2.5 V
Lead Channel Setting Pacing Pulse Width: 0.4 ms
Lead Channel Setting Sensing Sensitivity: 0.3 mV

## 2019-11-27 NOTE — Progress Notes (Signed)
Remote ICD transmission.   

## 2019-12-02 IMAGING — CR DG CHEST 2V
2 series · 2 of 2 positions shown · non-contrast
Comparison: None.

CLINICAL DATA: Cardiomyopathy.

EXAM:
CHEST - 2 VIEW

[chest pa]
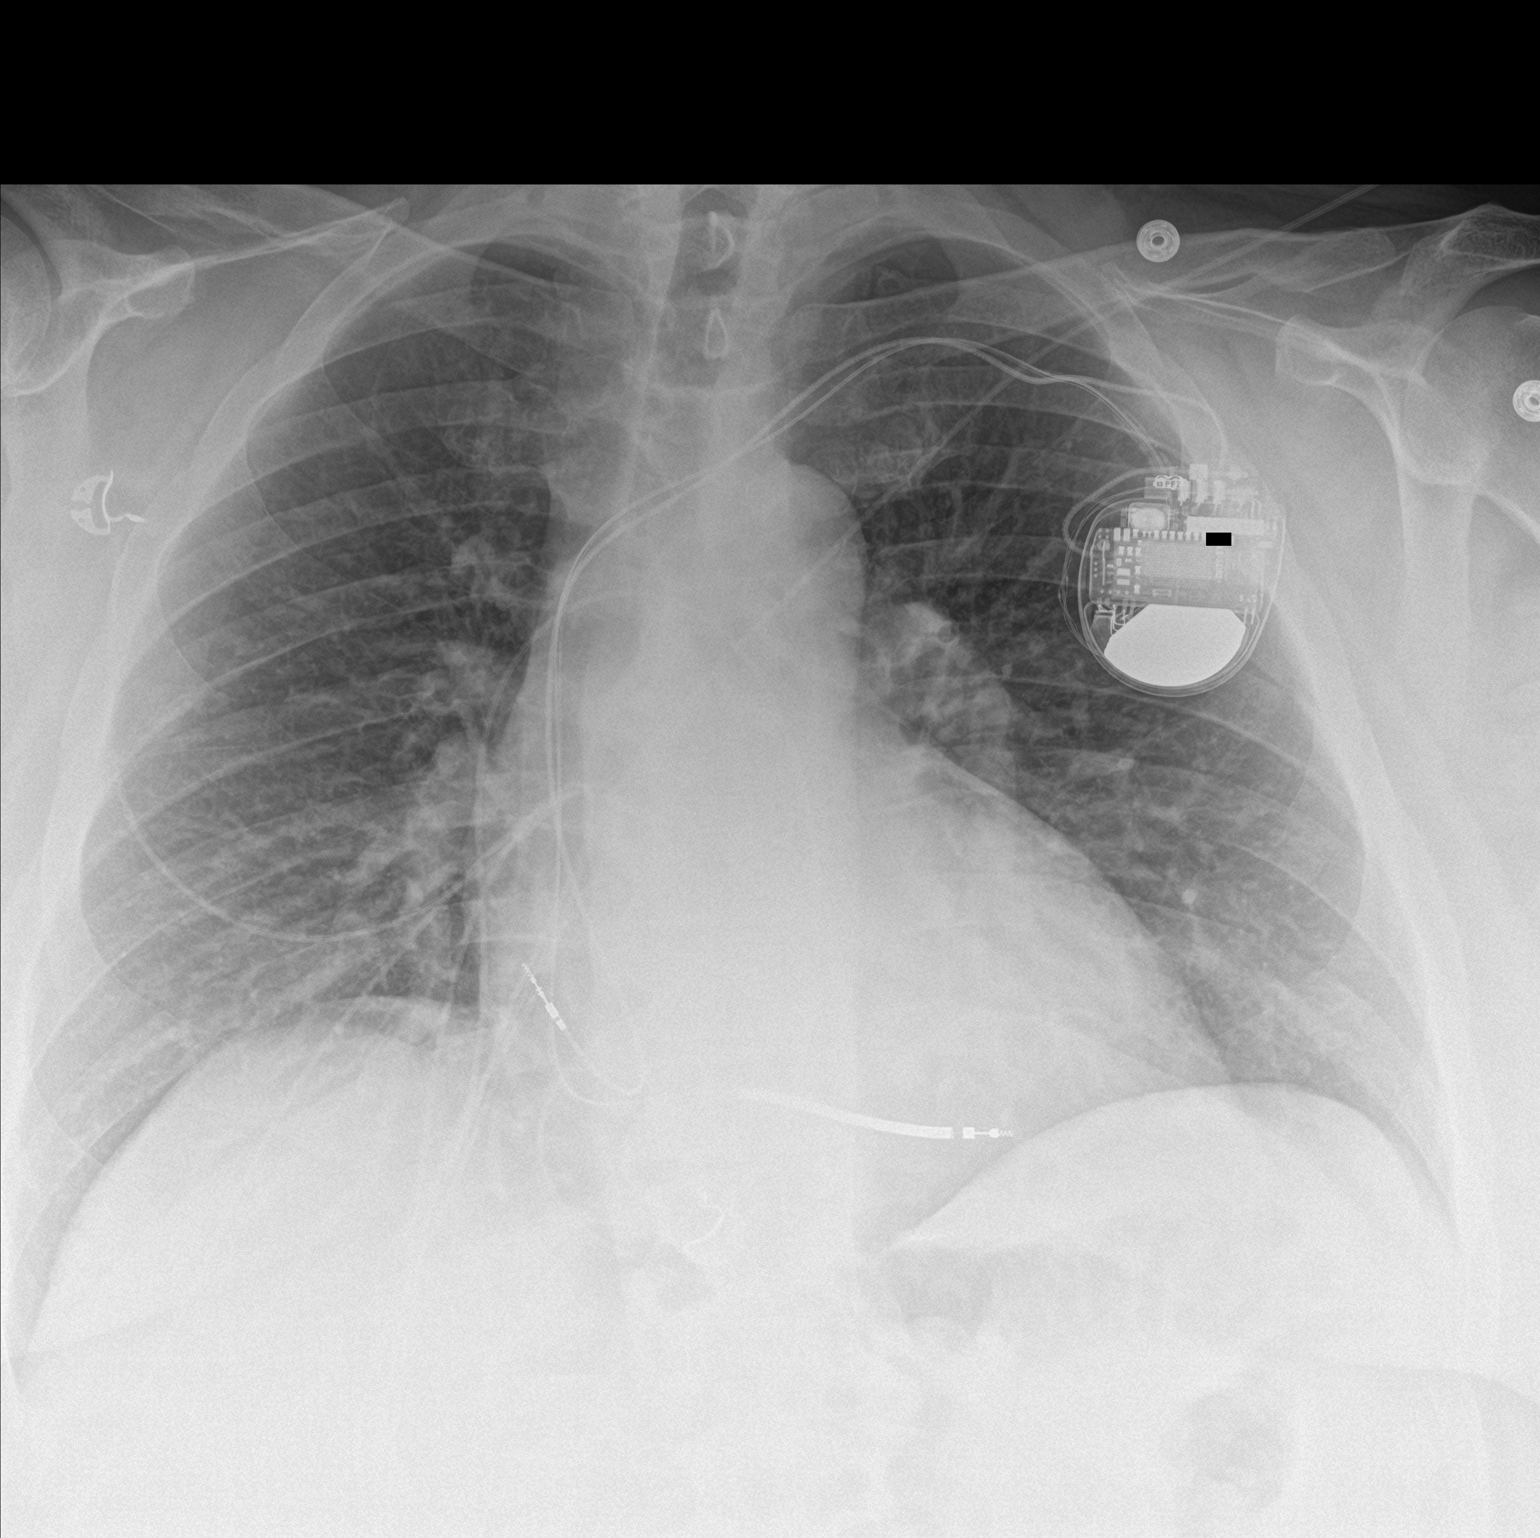

[chest lat]
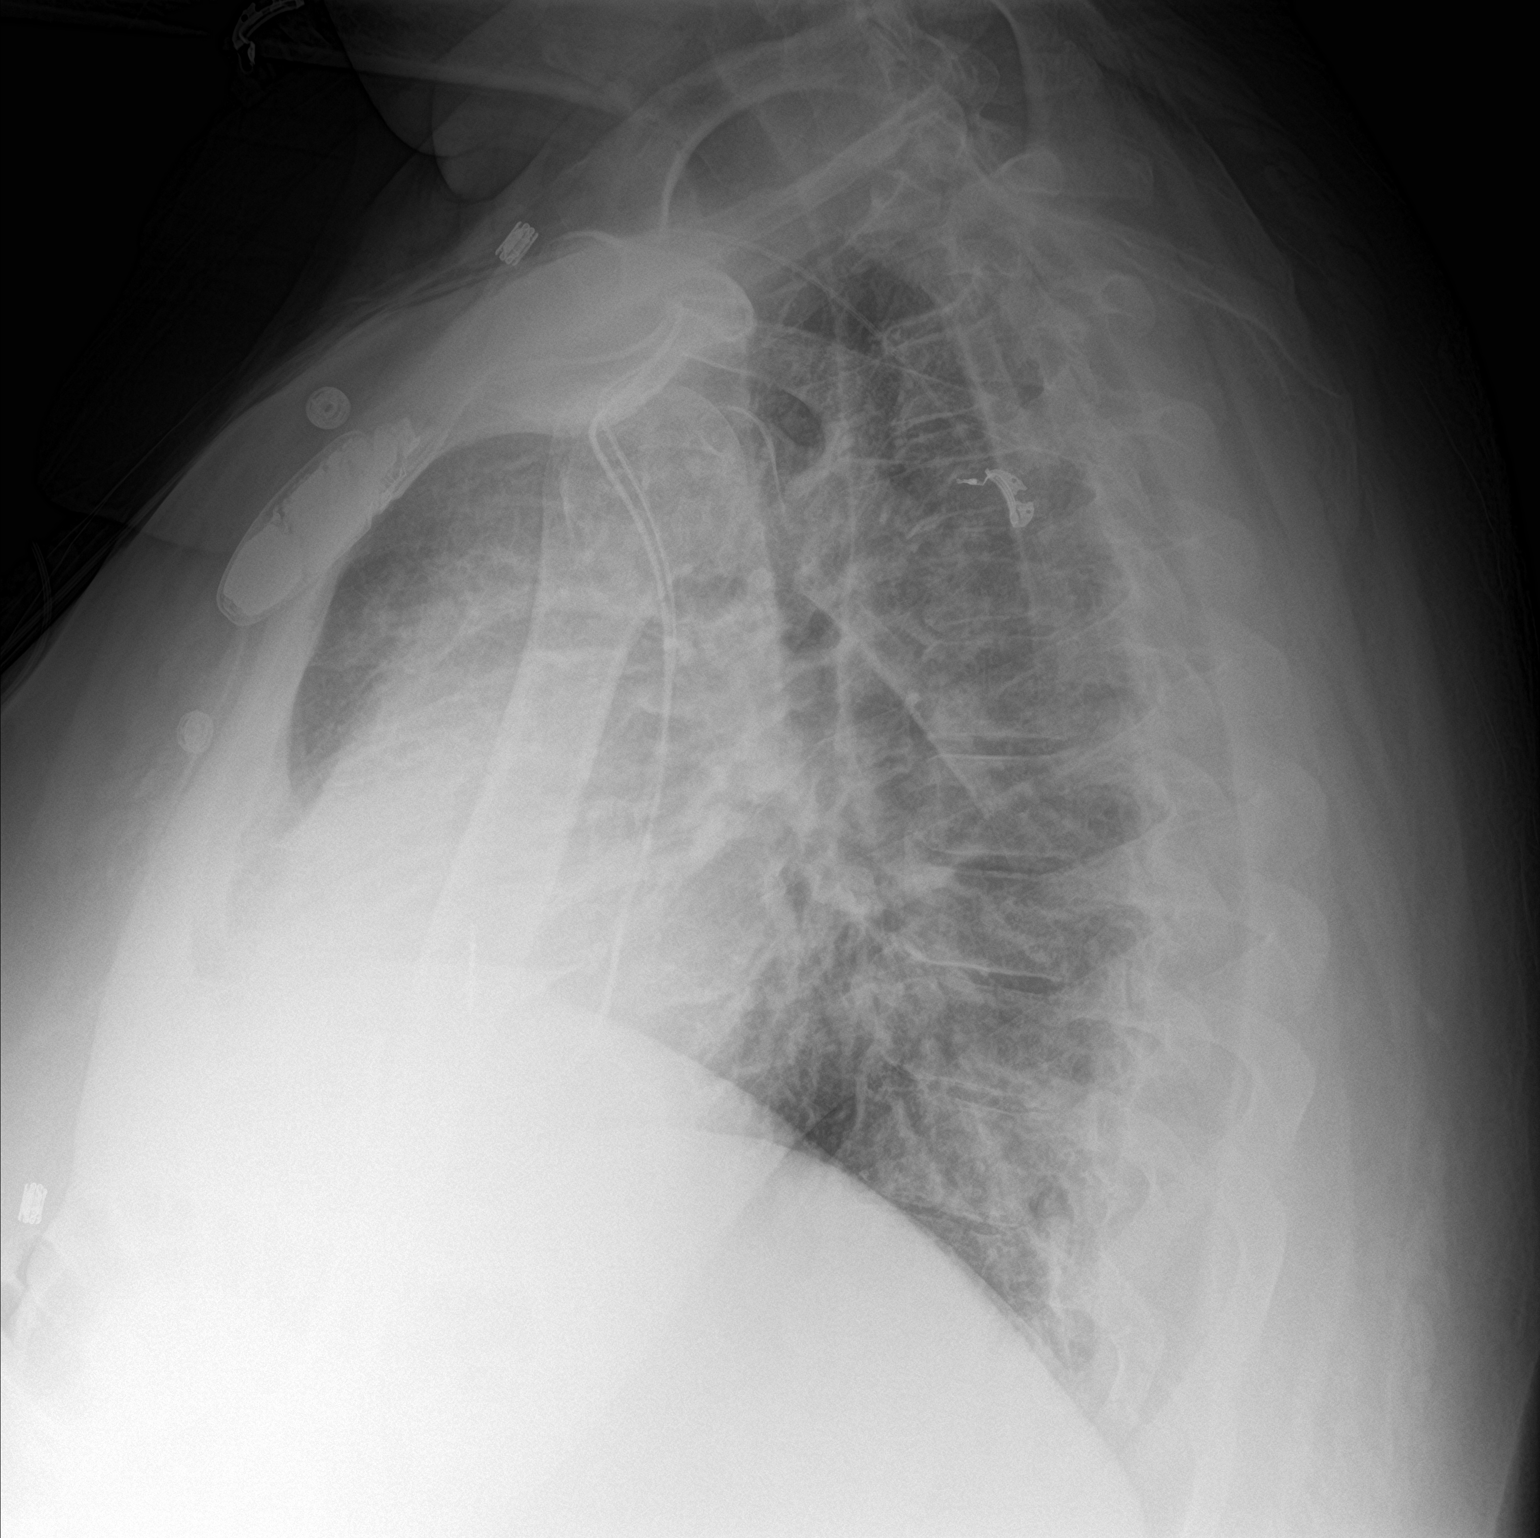

[2 of 2 positions shown; findings below may reference images not displayed]

FINDINGS: The heart size and mediastinal contours are within normal limits.
Both lungs are clear. The visualized skeletal structures are
unremarkable.
IMPRESSION: No active cardiopulmonary disease.

## 2020-02-05 ENCOUNTER — Other Ambulatory Visit (HOSPITAL_COMMUNITY): Payer: Self-pay | Admitting: Cardiology

## 2020-02-07 DIAGNOSIS — Z6841 Body Mass Index (BMI) 40.0 and over, adult: Secondary | ICD-10-CM | POA: Diagnosis not present

## 2020-02-07 DIAGNOSIS — K611 Rectal abscess: Secondary | ICD-10-CM | POA: Diagnosis not present

## 2020-02-25 ENCOUNTER — Ambulatory Visit (INDEPENDENT_AMBULATORY_CARE_PROVIDER_SITE_OTHER): Payer: Self-pay | Admitting: *Deleted

## 2020-02-25 ENCOUNTER — Other Ambulatory Visit: Payer: Self-pay | Admitting: Cardiology

## 2020-02-25 DIAGNOSIS — I428 Other cardiomyopathies: Secondary | ICD-10-CM

## 2020-02-26 LAB — CUP PACEART REMOTE DEVICE CHECK
Battery Remaining Longevity: 94 mo
Battery Voltage: 2.99 V
Brady Statistic AP VP Percent: 0.01 %
Brady Statistic AP VS Percent: 1.3 %
Brady Statistic AS VP Percent: 0.03 %
Brady Statistic AS VS Percent: 98.66 %
Brady Statistic RA Percent Paced: 1.29 %
Brady Statistic RV Percent Paced: 0.04 %
Date Time Interrogation Session: 20210921033524
HighPow Impedance: 93 Ohm
Implantable Lead Implant Date: 20190814
Implantable Lead Implant Date: 20190814
Implantable Lead Location: 753859
Implantable Lead Location: 753860
Implantable Lead Model: 5076
Implantable Pulse Generator Implant Date: 20190814
Lead Channel Impedance Value: 380 Ohm
Lead Channel Impedance Value: 456 Ohm
Lead Channel Impedance Value: 513 Ohm
Lead Channel Pacing Threshold Amplitude: 0.75 V
Lead Channel Pacing Threshold Amplitude: 0.875 V
Lead Channel Pacing Threshold Pulse Width: 0.4 ms
Lead Channel Pacing Threshold Pulse Width: 0.4 ms
Lead Channel Sensing Intrinsic Amplitude: 14.25 mV
Lead Channel Sensing Intrinsic Amplitude: 14.25 mV
Lead Channel Sensing Intrinsic Amplitude: 4 mV
Lead Channel Sensing Intrinsic Amplitude: 4 mV
Lead Channel Setting Pacing Amplitude: 1.5 V
Lead Channel Setting Pacing Amplitude: 2.5 V
Lead Channel Setting Pacing Pulse Width: 0.4 ms
Lead Channel Setting Sensing Sensitivity: 0.3 mV

## 2020-02-28 NOTE — Progress Notes (Signed)
Remote ICD transmission.   
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
## 2020-03-12 ENCOUNTER — Ambulatory Visit (HOSPITAL_COMMUNITY)
Admission: RE | Admit: 2020-03-12 | Discharge: 2020-03-12 | Disposition: A | Discharge status: Home / Self Care | Payer: Self-pay | Source: Ambulatory Visit | Attending: Adult Health | Admitting: Adult Health

## 2020-03-12 ENCOUNTER — Other Ambulatory Visit: Payer: Self-pay

## 2020-03-12 ENCOUNTER — Encounter (HOSPITAL_COMMUNITY): Payer: Self-pay

## 2020-03-12 VITALS — BP 122/98 | HR 63 | Wt >= 6400 oz

## 2020-03-12 DIAGNOSIS — Z8249 Family history of ischemic heart disease and other diseases of the circulatory system: Secondary | ICD-10-CM | POA: Insufficient documentation

## 2020-03-12 DIAGNOSIS — Z7901 Long term (current) use of anticoagulants: Secondary | ICD-10-CM | POA: Insufficient documentation

## 2020-03-12 DIAGNOSIS — E669 Obesity, unspecified: Secondary | ICD-10-CM | POA: Insufficient documentation

## 2020-03-12 DIAGNOSIS — Z9989 Dependence on other enabling machines and devices: Secondary | ICD-10-CM

## 2020-03-12 DIAGNOSIS — Z6841 Body Mass Index (BMI) 40.0 and over, adult: Secondary | ICD-10-CM | POA: Insufficient documentation

## 2020-03-12 DIAGNOSIS — N183 Chronic kidney disease, stage 3 unspecified: Secondary | ICD-10-CM | POA: Insufficient documentation

## 2020-03-12 DIAGNOSIS — I5022 Chronic systolic (congestive) heart failure: Secondary | ICD-10-CM | POA: Insufficient documentation

## 2020-03-12 DIAGNOSIS — Z79899 Other long term (current) drug therapy: Secondary | ICD-10-CM | POA: Insufficient documentation

## 2020-03-12 DIAGNOSIS — G4733 Obstructive sleep apnea (adult) (pediatric): Secondary | ICD-10-CM | POA: Insufficient documentation

## 2020-03-12 DIAGNOSIS — Z86718 Personal history of other venous thrombosis and embolism: Secondary | ICD-10-CM | POA: Insufficient documentation

## 2020-03-12 DIAGNOSIS — I428 Other cardiomyopathies: Secondary | ICD-10-CM | POA: Insufficient documentation

## 2020-03-12 LAB — BASIC METABOLIC PANEL
Anion gap: 10 (ref 5–15)
BUN: 14 mg/dL (ref 6–20)
CO2: 29 mmol/L (ref 22–32)
Calcium: 8.7 mg/dL — ABNORMAL LOW (ref 8.9–10.3)
Chloride: 103 mmol/L (ref 98–111)
Creatinine, Ser: 1.44 mg/dL — ABNORMAL HIGH (ref 0.61–1.24)
GFR calc non Af Amer: 55 mL/min — ABNORMAL LOW (ref >60)
Glucose, Bld: 143 mg/dL — ABNORMAL HIGH (ref 70–99)
Potassium: 3.3 mmol/L — ABNORMAL LOW (ref 3.5–5.1)
Sodium: 142 mmol/L (ref 135–145)

## 2020-03-12 MED ORDER — SPIRONOLACTONE 25 MG PO TABS: 25.0000 mg | ORAL_TABLET | Freq: Every day | ORAL | 6 refills | Status: DC

## 2020-03-12 MED ORDER — TORSEMIDE 20 MG PO TABS: 40.0000 mg | ORAL_TABLET | Freq: Every day | ORAL | 6 refills | Status: DC

## 2020-03-12 NOTE — Patient Instructions (Signed)
Refills sent in for Spironolactone and Torsemide  Your physician has requested that you have an echocardiogram. Echocardiography is a painless test that uses sound waves to create images of your heart. It provides your doctor with information about the size and shape of your heart and how well your heart's chambers and valves are working. This procedure takes approximately one hour. There are no restrictions for this procedure. THIS WILL BE DONE AT THE [facility], THEY WILL CALL YOU TO SCHEDULE  Your physician recommends that you schedule a follow-up appointment in: 3 months  If you have any questions or concerns before your next appointment please send [place] a message through [place] or call our office at [phone].    TO LEAVE A MESSAGE FOR THE NURSE SELECT OPTION 2, PLEASE LEAVE A MESSAGE INCLUDING: . YOUR NAME . DATE OF BIRTH . CALL BACK NUMBER . REASON FOR CALL**this is important as we prioritize the call backs  YOU WILL RECEIVE A CALL BACK THE SAME DAY AS LONG AS YOU CALL BEFORE 4:00 PM  At the [facility], you and your health needs are our priority. As part of our continuing mission to provide you with exceptional heart care, we have created designated Provider Care Teams. These Care Teams include your primary Cardiologist (physician) and Advanced Practice Providers (APPs- Physician Assistants and Nurse Practitioners) who all work together to provide you with the care you need, when you need it.   You may see any of the following providers on your designated Care Team at your next follow up: Marland Kitchen Dr Arvilla Meres . Dr Marca Ancona . Tonye Becket, NP . Robbie Lis, PA . Karle Plumber, PharmD   Please be sure to bring in all your medications bottles to every appointment.

## 2020-03-25 ENCOUNTER — Telehealth (HOSPITAL_COMMUNITY): Payer: Self-pay

## 2020-03-25 MED ORDER — POTASSIUM CHLORIDE CRYS ER 20 MEQ PO TBCR: 20.0000 meq | EXTENDED_RELEASE_TABLET | Freq: Every day | ORAL | 3 refills | Status: DC

## 2020-03-25 NOTE — Telephone Encounter (Addendum)
Samara Snide, RN  03/25/2020 4:19 PM EDT Back to Top    Patient advised and verbalized understanding   Samara Snide, RN  03/24/2020 2:22 PM EDT     Left message to return call   Samara Snide, RN  03/23/2020 4:46 PM EDT     Left message to return call    Sherald Hess, NP  03/12/2020 12:36 PM EDT     Renal function stable. No change.   K low . Take 40 meq potassium today then 20 meq daily. Please call. Repeat BMET in 7 days

## 2020-04-08 ENCOUNTER — Ambulatory Visit (HOSPITAL_COMMUNITY)
Admission: RE | Admit: 2020-04-08 | Discharge: 2020-04-08 | Disposition: A | Discharge status: Home / Self Care | Payer: BLUE CROSS/BLUE SHIELD | Source: Ambulatory Visit | Attending: Internal Medicine | Admitting: Internal Medicine

## 2020-04-08 ENCOUNTER — Other Ambulatory Visit: Payer: Self-pay

## 2020-04-08 ENCOUNTER — Ambulatory Visit (INDEPENDENT_AMBULATORY_CARE_PROVIDER_SITE_OTHER): Payer: BLUE CROSS/BLUE SHIELD

## 2020-04-08 DIAGNOSIS — I5022 Chronic systolic (congestive) heart failure: Secondary | ICD-10-CM | POA: Diagnosis not present

## 2020-04-08 LAB — BASIC METABOLIC PANEL
Anion gap: 12 (ref 5–15)
BUN: 21 mg/dL — ABNORMAL HIGH (ref 6–20)
CO2: 30 mmol/L (ref 22–32)
Calcium: 9.1 mg/dL (ref 8.9–10.3)
Chloride: 100 mmol/L (ref 98–111)
Creatinine, Ser: 1.54 mg/dL — ABNORMAL HIGH (ref 0.61–1.24)
GFR, Estimated: 54 mL/min — ABNORMAL LOW (ref >60)
Glucose, Bld: 136 mg/dL — ABNORMAL HIGH (ref 70–99)
Potassium: 3.2 mmol/L — ABNORMAL LOW (ref 3.5–5.1)
Sodium: 142 mmol/L (ref 135–145)

## 2020-04-08 LAB — ECHOCARDIOGRAM COMPLETE
Area-P 1/2: 7.16 cm2
MV M vel: 2.32 m/s
MV Peak grad: 21.5 mmHg
S' Lateral: 5.71 cm
Single Plane A4C EF: 33.3 %

## 2020-04-10 ENCOUNTER — Telehealth (HOSPITAL_COMMUNITY): Payer: Self-pay

## 2020-04-10 MED ORDER — POTASSIUM CHLORIDE CRYS ER 20 MEQ PO TBCR: 40.0000 meq | EXTENDED_RELEASE_TABLET | Freq: Every day | ORAL | 3 refills | Status: DC

## 2020-04-10 NOTE — Telephone Encounter (Signed)
-----   Message from Sherald Hess, NP sent at 04/08/2020  4:40 PM EDT ----- ECHO showed EF 35%. No change. Please call with results.

## 2020-04-10 NOTE — Telephone Encounter (Addendum)
Samara Snide, RN  04/10/2020 12:16 PM EDT Back to Top    Patient advised and verbalized understanding. Pt wants lab drawn locally, so prescription faxed to [facility] in [place] at [phone]. Med list updated   Samara Snide, RN  04/09/2020 12:45 PM EDT     Left message to return call

## 2020-04-10 NOTE — Telephone Encounter (Signed)
-----   Message from Sherald Hess, NP sent at 04/08/2020  4:39 PM EDT ----- Please call instruct to take 40 meq potassium daily. Repeat BMET in 7 days.

## 2020-04-10 NOTE — Telephone Encounter (Signed)
   Samara Snide, RN  04/10/2020 12:19 PM EDT Back to Top    Patient advised and verbalized understanding    Samara Snide, RN  04/09/2020 12:45 PM EDT     Left message to return call

## 2020-04-13 DIAGNOSIS — L03315 Cellulitis of perineum: Secondary | ICD-10-CM | POA: Diagnosis not present

## 2020-04-13 DIAGNOSIS — Z23 Encounter for immunization: Secondary | ICD-10-CM | POA: Diagnosis not present

## 2020-04-13 DIAGNOSIS — M109 Gout, unspecified: Secondary | ICD-10-CM | POA: Diagnosis not present

## 2020-04-15 DIAGNOSIS — K611 Rectal abscess: Secondary | ICD-10-CM | POA: Diagnosis not present

## 2020-04-15 DIAGNOSIS — Z6841 Body Mass Index (BMI) 40.0 and over, adult: Secondary | ICD-10-CM | POA: Diagnosis not present

## 2020-04-22 ENCOUNTER — Other Ambulatory Visit (HOSPITAL_COMMUNITY): Payer: Self-pay | Admitting: Cardiology

## 2020-05-27 ENCOUNTER — Ambulatory Visit (INDEPENDENT_AMBULATORY_CARE_PROVIDER_SITE_OTHER): Payer: BC Managed Care – PPO

## 2020-05-27 DIAGNOSIS — I428 Other cardiomyopathies: Secondary | ICD-10-CM | POA: Diagnosis not present

## 2020-05-29 LAB — CUP PACEART REMOTE DEVICE CHECK
Battery Remaining Longevity: 87 mo
Battery Voltage: 2.98 V
Brady Statistic AP VP Percent: 0.01 %
Brady Statistic AP VS Percent: 1.54 %
Brady Statistic AS VP Percent: 0.04 %
Brady Statistic AS VS Percent: 98.41 %
Brady Statistic RA Percent Paced: 1.52 %
Brady Statistic RV Percent Paced: 0.05 %
Date Time Interrogation Session: 20211222043725
HighPow Impedance: 90 Ohm
Implantable Lead Implant Date: 20190814
Implantable Lead Implant Date: 20190814
Implantable Lead Location: 753859
Implantable Lead Location: 753860
Implantable Lead Model: 5076
Implantable Pulse Generator Implant Date: 20190814
Lead Channel Impedance Value: 342 Ohm
Lead Channel Impedance Value: 437 Ohm
Lead Channel Impedance Value: 532 Ohm
Lead Channel Pacing Threshold Amplitude: 0.875 V
Lead Channel Pacing Threshold Amplitude: 0.875 V
Lead Channel Pacing Threshold Pulse Width: 0.4 ms
Lead Channel Pacing Threshold Pulse Width: 0.4 ms
Lead Channel Sensing Intrinsic Amplitude: 14.375 mV
Lead Channel Sensing Intrinsic Amplitude: 14.375 mV
Lead Channel Sensing Intrinsic Amplitude: 3.875 mV
Lead Channel Sensing Intrinsic Amplitude: 3.875 mV
Lead Channel Setting Pacing Amplitude: 1.75 V
Lead Channel Setting Pacing Amplitude: 2.5 V
Lead Channel Setting Pacing Pulse Width: 0.4 ms
Lead Channel Setting Sensing Sensitivity: 0.3 mV

## 2020-06-10 NOTE — Progress Notes (Signed)
Remote ICD transmission.   

## 2020-06-17 ENCOUNTER — Encounter (HOSPITAL_COMMUNITY): Payer: Self-pay | Admitting: Cardiology

## 2020-07-04 ENCOUNTER — Other Ambulatory Visit: Payer: Self-pay | Admitting: Internal Medicine

## 2020-07-22 ENCOUNTER — Other Ambulatory Visit (HOSPITAL_COMMUNITY): Payer: Self-pay | Admitting: Cardiology

## 2020-08-20 ENCOUNTER — Other Ambulatory Visit (HOSPITAL_COMMUNITY): Payer: Self-pay | Admitting: Cardiology

## 2020-08-26 ENCOUNTER — Ambulatory Visit (INDEPENDENT_AMBULATORY_CARE_PROVIDER_SITE_OTHER): Payer: Self-pay

## 2020-08-26 DIAGNOSIS — I428 Other cardiomyopathies: Secondary | ICD-10-CM

## 2020-08-26 DIAGNOSIS — I5022 Chronic systolic (congestive) heart failure: Secondary | ICD-10-CM

## 2020-08-26 LAB — CUP PACEART REMOTE DEVICE CHECK
Battery Remaining Longevity: 80 mo
Battery Voltage: 2.98 V
Brady Statistic AP VP Percent: 0.01 %
Brady Statistic AP VS Percent: 1.18 %
Brady Statistic AS VP Percent: 0.05 %
Brady Statistic AS VS Percent: 98.76 %
Brady Statistic RA Percent Paced: 1.16 %
Brady Statistic RV Percent Paced: 0.05 %
Date Time Interrogation Session: 20220323043823
HighPow Impedance: 97 Ohm
Implantable Lead Implant Date: 20190814
Implantable Lead Implant Date: 20190814
Implantable Lead Location: 753859
Implantable Lead Location: 753860
Implantable Lead Model: 5076
Implantable Pulse Generator Implant Date: 20190814
Lead Channel Impedance Value: 380 Ohm
Lead Channel Impedance Value: 399 Ohm
Lead Channel Impedance Value: 532 Ohm
Lead Channel Pacing Threshold Amplitude: 0.75 V
Lead Channel Pacing Threshold Amplitude: 1 V
Lead Channel Pacing Threshold Pulse Width: 0.4 ms
Lead Channel Pacing Threshold Pulse Width: 0.4 ms
Lead Channel Sensing Intrinsic Amplitude: 15.625 mV
Lead Channel Sensing Intrinsic Amplitude: 15.625 mV
Lead Channel Sensing Intrinsic Amplitude: 3.375 mV
Lead Channel Sensing Intrinsic Amplitude: 3.375 mV
Lead Channel Setting Pacing Amplitude: 1.5 V
Lead Channel Setting Pacing Amplitude: 2.5 V
Lead Channel Setting Pacing Pulse Width: 0.4 ms
Lead Channel Setting Sensing Sensitivity: 0.3 mV

## 2020-08-28 ENCOUNTER — Encounter (HOSPITAL_COMMUNITY): Payer: Self-pay | Admitting: Cardiology

## 2020-08-28 ENCOUNTER — Telehealth (HOSPITAL_COMMUNITY): Payer: Self-pay | Admitting: *Deleted

## 2020-08-28 ENCOUNTER — Other Ambulatory Visit: Payer: Self-pay

## 2020-08-28 ENCOUNTER — Ambulatory Visit (HOSPITAL_COMMUNITY)
Admission: RE | Admit: 2020-08-28 | Discharge: 2020-08-28 | Disposition: A | Discharge status: Home / Self Care | Payer: BC Managed Care – PPO | Source: Ambulatory Visit | Attending: Cardiology | Admitting: Cardiology

## 2020-08-28 VITALS — BP 118/70 | HR 70 | Wt >= 6400 oz

## 2020-08-28 DIAGNOSIS — I428 Other cardiomyopathies: Secondary | ICD-10-CM | POA: Diagnosis not present

## 2020-08-28 DIAGNOSIS — E785 Hyperlipidemia, unspecified: Secondary | ICD-10-CM

## 2020-08-28 DIAGNOSIS — N183 Chronic kidney disease, stage 3 unspecified: Secondary | ICD-10-CM | POA: Diagnosis not present

## 2020-08-28 DIAGNOSIS — R0600 Dyspnea, unspecified: Secondary | ICD-10-CM | POA: Insufficient documentation

## 2020-08-28 DIAGNOSIS — Z8249 Family history of ischemic heart disease and other diseases of the circulatory system: Secondary | ICD-10-CM | POA: Insufficient documentation

## 2020-08-28 DIAGNOSIS — I5022 Chronic systolic (congestive) heart failure: Secondary | ICD-10-CM | POA: Insufficient documentation

## 2020-08-28 DIAGNOSIS — G4733 Obstructive sleep apnea (adult) (pediatric): Secondary | ICD-10-CM | POA: Insufficient documentation

## 2020-08-28 DIAGNOSIS — Z86718 Personal history of other venous thrombosis and embolism: Secondary | ICD-10-CM | POA: Insufficient documentation

## 2020-08-28 DIAGNOSIS — Z7984 Long term (current) use of oral hypoglycemic drugs: Secondary | ICD-10-CM | POA: Diagnosis not present

## 2020-08-28 DIAGNOSIS — Z79899 Other long term (current) drug therapy: Secondary | ICD-10-CM | POA: Diagnosis not present

## 2020-08-28 HISTORY — DX: Heart failure, unspecified: I50.9

## 2020-08-28 LAB — LIPID PANEL
Cholesterol: 157 mg/dL (ref 0–200)
HDL: 27 mg/dL — ABNORMAL LOW (ref >40)
LDL Cholesterol: 78 mg/dL (ref 0–99)
Total CHOL/HDL Ratio: 5.8 RATIO
Triglycerides: 261 mg/dL — ABNORMAL HIGH (ref <150)
VLDL: 52 mg/dL — ABNORMAL HIGH (ref 0–40)

## 2020-08-28 LAB — BASIC METABOLIC PANEL
Anion gap: 6 (ref 5–15)
BUN: 19 mg/dL (ref 6–20)
CO2: 30 mmol/L (ref 22–32)
Calcium: 8.9 mg/dL (ref 8.9–10.3)
Chloride: 105 mmol/L (ref 98–111)
Creatinine, Ser: 1.58 mg/dL — ABNORMAL HIGH (ref 0.61–1.24)
GFR, Estimated: 52 mL/min — ABNORMAL LOW (ref >60)
Glucose, Bld: 161 mg/dL — ABNORMAL HIGH (ref 70–99)
Potassium: 2.7 mmol/L — CL (ref 3.5–5.1)
Sodium: 141 mmol/L (ref 135–145)

## 2020-08-28 MED ORDER — TORSEMIDE 20 MG PO TABS: 20.0000 mg | ORAL_TABLET | Freq: Every day | ORAL | 6 refills | Status: DC

## 2020-08-28 MED ORDER — POTASSIUM CHLORIDE CRYS ER 20 MEQ PO TBCR: EXTENDED_RELEASE_TABLET | ORAL | 3 refills | Status: DC

## 2020-08-28 MED ORDER — DAPAGLIFLOZIN PROPANEDIOL 10 MG PO TABS: 10.0000 mg | ORAL_TABLET | Freq: Every day | ORAL | 6 refills | Status: DC

## 2020-08-28 NOTE — Patient Instructions (Signed)
Start Farxiga 10 mg Daily  Decrease Torsemide to 20 mg (1 tab) Daily  Labs done today, your results will be available in MyChart, we will contact you for abnormal readings.  Your physician recommends that you return for lab work in: 10 days, we have provided you a prescription to have this done locally  Your physician recommends that you schedule a follow-up appointment in: 3 months  If you have any questions or concerns before your next appointment please send [place] a message through [place] or call our office at [phone].    TO LEAVE A MESSAGE FOR THE NURSE SELECT OPTION 2, PLEASE LEAVE A MESSAGE INCLUDING: . YOUR NAME . DATE OF BIRTH . CALL BACK NUMBER . REASON FOR CALL**this is important as we prioritize the call backs  YOU WILL RECEIVE A CALL BACK THE SAME DAY AS LONG AS YOU CALL BEFORE 4:00 PM  At the [facility], you and your health needs are our priority. As part of our continuing mission to provide you with exceptional heart care, we have created designated Provider Care Teams. These Care Teams include your primary Cardiologist (physician) and Advanced Practice Providers (APPs- Physician Assistants and Nurse Practitioners) who all work together to provide you with the care you need, when you need it.   You may see any of the following providers on your designated Care Team at your next follow up: Marland Kitchen Dr Arvilla Meres . Dr Marca Ancona . Dr Thornell Mule . Tonye Becket, NP . Robbie Lis, PA . Shanda Bumps [place],NP . Karle Plumber, PharmD   Please be sure to bring in all your medications bottles to every appointment.

## 2020-08-28 NOTE — Telephone Encounter (Signed)
Pt aware, agreeable, and verbalized understanding, pt already sch for labs at PCP office on 4/4

## 2020-08-28 NOTE — Telephone Encounter (Signed)
-----   Message from [name], MD sent at 08/28/2020 12:57 PM EDT ----- Discussed low K with nurse who will call, increase KCl to 40 bid x 2 days then 40 qam/20 qpm. BMET 1 week.

## 2020-08-28 NOTE — Progress Notes (Signed)
Pt given Farxiga 30 day free card and copay card
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
## 2020-09-04 NOTE — Progress Notes (Signed)
Remote ICD transmission.   

## 2020-09-22 ENCOUNTER — Other Ambulatory Visit (HOSPITAL_COMMUNITY): Payer: Self-pay | Admitting: Cardiology

## 2020-10-14 ENCOUNTER — Other Ambulatory Visit (HOSPITAL_COMMUNITY): Payer: Self-pay | Admitting: Adult Health

## 2020-11-23 ENCOUNTER — Other Ambulatory Visit (HOSPITAL_COMMUNITY): Payer: Self-pay

## 2020-11-25 ENCOUNTER — Ambulatory Visit (INDEPENDENT_AMBULATORY_CARE_PROVIDER_SITE_OTHER): Payer: Self-pay

## 2020-11-25 DIAGNOSIS — I428 Other cardiomyopathies: Secondary | ICD-10-CM

## 2020-11-25 LAB — CUP PACEART REMOTE DEVICE CHECK
Battery Remaining Longevity: 73 mo
Battery Voltage: 2.98 V
Brady Statistic AP VP Percent: 0.01 %
Brady Statistic AP VS Percent: 1.23 %
Brady Statistic AS VP Percent: 0.04 %
Brady Statistic AS VS Percent: 98.72 %
Brady Statistic RA Percent Paced: 1.21 %
Brady Statistic RV Percent Paced: 0.04 %
Date Time Interrogation Session: 20220622052825
HighPow Impedance: 89 Ohm
Implantable Lead Implant Date: 20190814
Implantable Lead Implant Date: 20190814
Implantable Lead Location: 753859
Implantable Lead Location: 753860
Implantable Lead Model: 5076
Implantable Pulse Generator Implant Date: 20190814
Lead Channel Impedance Value: 380 Ohm
Lead Channel Impedance Value: 494 Ohm
Lead Channel Impedance Value: 532 Ohm
Lead Channel Pacing Threshold Amplitude: 0.75 V
Lead Channel Pacing Threshold Amplitude: 1 V
Lead Channel Pacing Threshold Pulse Width: 0.4 ms
Lead Channel Pacing Threshold Pulse Width: 0.4 ms
Lead Channel Sensing Intrinsic Amplitude: 14.25 mV
Lead Channel Sensing Intrinsic Amplitude: 14.25 mV
Lead Channel Sensing Intrinsic Amplitude: 3.5 mV
Lead Channel Sensing Intrinsic Amplitude: 3.5 mV
Lead Channel Setting Pacing Amplitude: 2 V
Lead Channel Setting Pacing Amplitude: 2.5 V
Lead Channel Setting Pacing Pulse Width: 0.4 ms
Lead Channel Setting Sensing Sensitivity: 0.3 mV
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
## 2020-11-30 ENCOUNTER — Telehealth (HOSPITAL_COMMUNITY): Payer: Self-pay | Admitting: Surgery

## 2020-11-30 ENCOUNTER — Other Ambulatory Visit: Payer: Self-pay

## 2020-11-30 ENCOUNTER — Encounter (HOSPITAL_COMMUNITY): Payer: Self-pay

## 2020-11-30 ENCOUNTER — Ambulatory Visit (HOSPITAL_COMMUNITY)
Admission: RE | Admit: 2020-11-30 | Discharge: 2020-11-30 | Disposition: A | Discharge status: Home / Self Care | Payer: BC Managed Care – PPO | Source: Ambulatory Visit | Attending: Family Medicine | Admitting: Family Medicine

## 2020-11-30 VITALS — BP 102/70 | HR 76 | Wt >= 6400 oz

## 2020-11-30 DIAGNOSIS — I714 Abdominal aortic aneurysm, without rupture, unspecified: Secondary | ICD-10-CM

## 2020-11-30 DIAGNOSIS — I428 Other cardiomyopathies: Secondary | ICD-10-CM | POA: Insufficient documentation

## 2020-11-30 DIAGNOSIS — Z8249 Family history of ischemic heart disease and other diseases of the circulatory system: Secondary | ICD-10-CM | POA: Diagnosis not present

## 2020-11-30 DIAGNOSIS — G4733 Obstructive sleep apnea (adult) (pediatric): Secondary | ICD-10-CM | POA: Diagnosis not present

## 2020-11-30 DIAGNOSIS — N183 Chronic kidney disease, stage 3 unspecified: Secondary | ICD-10-CM

## 2020-11-30 DIAGNOSIS — E669 Obesity, unspecified: Secondary | ICD-10-CM | POA: Diagnosis not present

## 2020-11-30 DIAGNOSIS — I272 Pulmonary hypertension, unspecified: Secondary | ICD-10-CM

## 2020-11-30 DIAGNOSIS — Z86718 Personal history of other venous thrombosis and embolism: Secondary | ICD-10-CM | POA: Diagnosis not present

## 2020-11-30 DIAGNOSIS — Z7901 Long term (current) use of anticoagulants: Secondary | ICD-10-CM | POA: Diagnosis not present

## 2020-11-30 DIAGNOSIS — Z7984 Long term (current) use of oral hypoglycemic drugs: Secondary | ICD-10-CM | POA: Diagnosis not present

## 2020-11-30 DIAGNOSIS — E785 Hyperlipidemia, unspecified: Secondary | ICD-10-CM

## 2020-11-30 DIAGNOSIS — Z6841 Body Mass Index (BMI) 40.0 and over, adult: Secondary | ICD-10-CM | POA: Diagnosis not present

## 2020-11-30 DIAGNOSIS — Z9989 Dependence on other enabling machines and devices: Secondary | ICD-10-CM

## 2020-11-30 DIAGNOSIS — Z79899 Other long term (current) drug therapy: Secondary | ICD-10-CM | POA: Insufficient documentation

## 2020-11-30 DIAGNOSIS — I5022 Chronic systolic (congestive) heart failure: Secondary | ICD-10-CM | POA: Diagnosis not present

## 2020-11-30 LAB — BASIC METABOLIC PANEL
Anion gap: 6 (ref 5–15)
BUN: 10 mg/dL (ref 6–20)
CO2: 28 mmol/L (ref 22–32)
Calcium: 9.1 mg/dL (ref 8.9–10.3)
Chloride: 108 mmol/L (ref 98–111)
Creatinine, Ser: 1.29 mg/dL — ABNORMAL HIGH (ref 0.61–1.24)
GFR, Estimated: >60 mL/min (ref >60)
Glucose, Bld: 111 mg/dL — ABNORMAL HIGH (ref 70–99)
Potassium: 3.8 mmol/L (ref 3.5–5.1)
Sodium: 142 mmol/L (ref 135–145)

## 2020-11-30 LAB — BRAIN NATRIURETIC PEPTIDE: B Natriuretic Peptide: 47 pg/mL (ref 0.0–100.0)

## 2020-11-30 NOTE — Patient Instructions (Signed)
It was great to see you today! No medication changes are needed at this time.   Labs today We will only contact you if something comes back abnormal or we need to make some changes. Otherwise no news is good news!  You have been referred to [facility] for an abdominal u/s -they will be in contact for an appointment  Your physician recommends that you schedule a follow-up appointment in: 3-4 months with Dr Shirlee Latch  Do the following things EVERYDAY: Weigh yourself in the morning before breakfast. Write it down and keep it in a log. Take your medicines as prescribed Eat low salt foods--Limit salt (sodium) to 2000 mg per day.  Stay as active as you can everyday Limit all fluids for the day to less than 2 liters  At the [facility], you and your health needs are our priority. As part of our continuing mission to provide you with exceptional heart care, we have created designated Provider Care Teams. These Care Teams include your primary Cardiologist (physician) and Advanced Practice Providers (APPs- Physician Assistants and Nurse Practitioners) who all work together to provide you with the care you need, when you need it.   You may see any of the following providers on your designated Care Team at your next follow up: Dr Arvilla Meres Dr Marca Ancona Dr Brandon Melnick, NP Robbie Lis, [place] Mikki Santee Karle Plumber, PharmD   Please be sure to bring in all your medications bottles to every appointment. .  If you have any questions or concerns before your next appointment please send [place] a message through [place] or call our office at [phone].    TO LEAVE A MESSAGE FOR THE NURSE SELECT OPTION 2, PLEASE LEAVE A MESSAGE INCLUDING: YOUR NAME DATE OF BIRTH CALL BACK NUMBER REASON FOR CALL**this is important as we prioritize the call backs  YOU WILL RECEIVE A CALL BACK THE SAME DAY AS LONG AS YOU CALL BEFORE 4:00 PM

## 2020-11-30 NOTE — Telephone Encounter (Signed)
I called and left message for Mitchell Jennings regarding a scale for daily weights.  A scale with weight capacity to 550 lbs is ordered and will be delivered by Saturday.

## 2020-12-10 ENCOUNTER — Other Ambulatory Visit: Payer: Self-pay

## 2020-12-15 NOTE — Progress Notes (Signed)
Remote ICD transmission.   

## 2021-02-22 ENCOUNTER — Other Ambulatory Visit (HOSPITAL_COMMUNITY): Payer: Self-pay | Admitting: Cardiology

## 2021-02-24 ENCOUNTER — Ambulatory Visit (INDEPENDENT_AMBULATORY_CARE_PROVIDER_SITE_OTHER): Payer: Self-pay

## 2021-02-24 DIAGNOSIS — I428 Other cardiomyopathies: Secondary | ICD-10-CM

## 2021-02-24 LAB — CUP PACEART REMOTE DEVICE CHECK
Battery Remaining Longevity: 68 mo
Battery Voltage: 2.98 V
Brady Statistic AP VP Percent: 0 %
Brady Statistic AP VS Percent: 0.42 %
Brady Statistic AS VP Percent: 0.04 %
Brady Statistic AS VS Percent: 99.54 %
Brady Statistic RA Percent Paced: 0.41 %
Brady Statistic RV Percent Paced: 0.04 %
Date Time Interrogation Session: 20220921012502
HighPow Impedance: 99 Ohm
Implantable Lead Implant Date: 20190814
Implantable Lead Implant Date: 20190814
Implantable Lead Location: 753859
Implantable Lead Location: 753860
Implantable Lead Model: 5076
Implantable Pulse Generator Implant Date: 20190814
Lead Channel Impedance Value: 399 Ohm
Lead Channel Impedance Value: 456 Ohm
Lead Channel Impedance Value: 570 Ohm
Lead Channel Pacing Threshold Amplitude: 0.75 V
Lead Channel Pacing Threshold Amplitude: 0.75 V
Lead Channel Pacing Threshold Pulse Width: 0.4 ms
Lead Channel Pacing Threshold Pulse Width: 0.4 ms
Lead Channel Sensing Intrinsic Amplitude: 14.75 mV
Lead Channel Sensing Intrinsic Amplitude: 14.75 mV
Lead Channel Sensing Intrinsic Amplitude: 3.5 mV
Lead Channel Sensing Intrinsic Amplitude: 3.5 mV
Lead Channel Setting Pacing Amplitude: 1.5 V
Lead Channel Setting Pacing Amplitude: 2.5 V
Lead Channel Setting Pacing Pulse Width: 0.4 ms
Lead Channel Setting Sensing Sensitivity: 0.3 mV

## 2021-03-03 ENCOUNTER — Encounter (HOSPITAL_COMMUNITY): Payer: BC Managed Care – PPO | Admitting: Cardiology

## 2021-03-03 NOTE — Progress Notes (Signed)
Remote ICD transmission.   

## 2021-07-21 ENCOUNTER — Other Ambulatory Visit (HOSPITAL_COMMUNITY): Payer: Self-pay | Admitting: Cardiology

## 2021-07-27 ENCOUNTER — Other Ambulatory Visit (HOSPITAL_COMMUNITY): Payer: Self-pay | Admitting: Adult Health

## 2021-08-25 ENCOUNTER — Ambulatory Visit (INDEPENDENT_AMBULATORY_CARE_PROVIDER_SITE_OTHER): Payer: Self-pay

## 2021-08-25 DIAGNOSIS — I428 Other cardiomyopathies: Secondary | ICD-10-CM

## 2021-08-25 LAB — CUP PACEART REMOTE DEVICE CHECK
Battery Remaining Longevity: 58 mo
Battery Voltage: 2.98 V
Brady Statistic AP VP Percent: 0.01 %
Brady Statistic AP VS Percent: 0.84 %
Brady Statistic AS VP Percent: 0.05 %
Brady Statistic AS VS Percent: 99.1 %
Brady Statistic RA Percent Paced: 0.83 %
Brady Statistic RV Percent Paced: 0.05 %
Date Time Interrogation Session: 20230322043624
HighPow Impedance: 98 Ohm
Implantable Lead Implant Date: 20190814
Implantable Lead Implant Date: 20190814
Implantable Lead Location: 753859
Implantable Lead Location: 753860
Implantable Lead Model: 5076
Implantable Pulse Generator Implant Date: 20190814
Lead Channel Impedance Value: 380 Ohm
Lead Channel Impedance Value: 494 Ohm
Lead Channel Impedance Value: 532 Ohm
Lead Channel Pacing Threshold Amplitude: 0.75 V
Lead Channel Pacing Threshold Amplitude: 0.875 V
Lead Channel Pacing Threshold Pulse Width: 0.4 ms
Lead Channel Pacing Threshold Pulse Width: 0.4 ms
Lead Channel Sensing Intrinsic Amplitude: 15.375 mV
Lead Channel Sensing Intrinsic Amplitude: 15.375 mV
Lead Channel Sensing Intrinsic Amplitude: 3.875 mV
Lead Channel Sensing Intrinsic Amplitude: 3.875 mV
Lead Channel Setting Pacing Amplitude: 1.75 V
Lead Channel Setting Pacing Amplitude: 2.5 V
Lead Channel Setting Pacing Pulse Width: 0.4 ms
Lead Channel Setting Sensing Sensitivity: 0.3 mV

## 2021-08-31 ENCOUNTER — Other Ambulatory Visit (HOSPITAL_COMMUNITY): Payer: Self-pay | Admitting: Adult Health

## 2021-08-31 ENCOUNTER — Other Ambulatory Visit (HOSPITAL_COMMUNITY): Payer: Self-pay | Admitting: Cardiology

## 2021-09-07 NOTE — Progress Notes (Signed)
Remote ICD transmission.   

## 2021-09-10 ENCOUNTER — Telehealth (HOSPITAL_COMMUNITY): Payer: Self-pay | Admitting: Pharmacy Technician

## 2021-09-10 ENCOUNTER — Encounter (HOSPITAL_COMMUNITY): Payer: Self-pay

## 2021-09-10 ENCOUNTER — Other Ambulatory Visit (HOSPITAL_COMMUNITY): Payer: Self-pay

## 2021-09-10 ENCOUNTER — Ambulatory Visit (HOSPITAL_COMMUNITY)
Admission: RE | Admit: 2021-09-10 | Discharge: 2021-09-10 | Disposition: A | Discharge status: Home / Self Care | Payer: Managed Care, Other (non HMO) | Source: Ambulatory Visit | Attending: Family Medicine | Admitting: Family Medicine

## 2021-09-10 VITALS — BP 108/70 | HR 63 | Wt >= 6400 oz

## 2021-09-10 DIAGNOSIS — E785 Hyperlipidemia, unspecified: Secondary | ICD-10-CM | POA: Insufficient documentation

## 2021-09-10 DIAGNOSIS — N183 Chronic kidney disease, stage 3 unspecified: Secondary | ICD-10-CM | POA: Insufficient documentation

## 2021-09-10 DIAGNOSIS — I714 Abdominal aortic aneurysm, without rupture, unspecified: Secondary | ICD-10-CM | POA: Insufficient documentation

## 2021-09-10 DIAGNOSIS — I493 Ventricular premature depolarization: Secondary | ICD-10-CM | POA: Insufficient documentation

## 2021-09-10 DIAGNOSIS — Z86718 Personal history of other venous thrombosis and embolism: Secondary | ICD-10-CM | POA: Diagnosis not present

## 2021-09-10 DIAGNOSIS — G4733 Obstructive sleep apnea (adult) (pediatric): Secondary | ICD-10-CM | POA: Insufficient documentation

## 2021-09-10 DIAGNOSIS — Z9884 Bariatric surgery status: Secondary | ICD-10-CM | POA: Insufficient documentation

## 2021-09-10 DIAGNOSIS — I428 Other cardiomyopathies: Secondary | ICD-10-CM | POA: Diagnosis not present

## 2021-09-10 DIAGNOSIS — I5022 Chronic systolic (congestive) heart failure: Secondary | ICD-10-CM | POA: Insufficient documentation

## 2021-09-10 DIAGNOSIS — Z79899 Other long term (current) drug therapy: Secondary | ICD-10-CM | POA: Diagnosis not present

## 2021-09-10 DIAGNOSIS — Z7901 Long term (current) use of anticoagulants: Secondary | ICD-10-CM | POA: Diagnosis not present

## 2021-09-10 DIAGNOSIS — Z7984 Long term (current) use of oral hypoglycemic drugs: Secondary | ICD-10-CM | POA: Insufficient documentation

## 2021-09-10 DIAGNOSIS — I272 Pulmonary hypertension, unspecified: Secondary | ICD-10-CM | POA: Diagnosis not present

## 2021-09-10 DIAGNOSIS — Z9989 Dependence on other enabling machines and devices: Secondary | ICD-10-CM

## 2021-09-10 LAB — BASIC METABOLIC PANEL
Anion gap: 8 (ref 5–15)
BUN: 17 mg/dL (ref 6–20)
CO2: 28 mmol/L (ref 22–32)
Calcium: 9.1 mg/dL (ref 8.9–10.3)
Chloride: 107 mmol/L (ref 98–111)
Creatinine, Ser: 1.62 mg/dL — ABNORMAL HIGH (ref 0.61–1.24)
GFR, Estimated: 50 mL/min — ABNORMAL LOW (ref >60)
Glucose, Bld: 116 mg/dL — ABNORMAL HIGH (ref 70–99)
Potassium: 3.6 mmol/L (ref 3.5–5.1)
Sodium: 143 mmol/L (ref 135–145)

## 2021-09-10 LAB — BRAIN NATRIURETIC PEPTIDE: B Natriuretic Peptide: 39.7 pg/mL (ref 0.0–100.0)

## 2021-09-10 LAB — LIPID PANEL
Cholesterol: 167 mg/dL (ref 0–200)
HDL: 40 mg/dL — ABNORMAL LOW (ref >40)
LDL Cholesterol: 83 mg/dL (ref 0–99)
Total CHOL/HDL Ratio: 4.2 RATIO
Triglycerides: 222 mg/dL — ABNORMAL HIGH (ref <150)
VLDL: 44 mg/dL — ABNORMAL HIGH (ref 0–40)

## 2021-09-10 LAB — MAGNESIUM: Magnesium: 2.4 mg/dL (ref 1.7–2.4)

## 2021-09-10 LAB — TSH: TSH: 0.799 u[IU]/mL (ref 0.350–4.500)

## 2021-09-10 MED ORDER — SPIRONOLACTONE 25 MG PO TABS: 25.0000 mg | ORAL_TABLET | Freq: Every day | ORAL | 5 refills | Status: DC

## 2021-09-10 MED ORDER — METOPROLOL SUCCINATE ER 100 MG PO TB24
100.0000 mg | ORAL_TABLET | Freq: Two times a day (BID) | ORAL | 6 refills | Status: DC
Start: 2021-09-10 — End: 2022-01-27

## 2021-09-10 MED ORDER — POTASSIUM CHLORIDE CRYS ER 20 MEQ PO TBCR
EXTENDED_RELEASE_TABLET | ORAL | 4 refills | Status: DC
Start: 2021-09-10 — End: 2022-01-19

## 2021-09-10 MED ORDER — DAPAGLIFLOZIN PROPANEDIOL 10 MG PO TABS: 10.0000 mg | ORAL_TABLET | Freq: Every day | ORAL | 4 refills | Status: AC

## 2021-09-10 MED ORDER — ROSUVASTATIN CALCIUM 5 MG PO TABS: 5.0000 mg | ORAL_TABLET | Freq: Every day | ORAL | 2 refills | Status: DC

## 2021-09-10 MED ORDER — ENTRESTO 49-51 MG PO TABS: 1.0000 | ORAL_TABLET | Freq: Two times a day (BID) | ORAL | 6 refills | Status: DC

## 2021-09-10 MED ORDER — RIVAROXABAN 20 MG PO TABS
20.0000 mg | ORAL_TABLET | Freq: Every day | ORAL | 3 refills | Status: DC
Start: 2021-09-10 — End: 2022-01-19

## 2021-09-10 MED ORDER — TORSEMIDE 20 MG PO TABS: 20.0000 mg | ORAL_TABLET | Freq: Every day | ORAL | 6 refills | Status: DC
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
## 2021-09-10 NOTE — Telephone Encounter (Signed)
Advanced Heart Failure Patient Advocate Encounter ? ?Prior Authorization for Xarelto has been submitted and approved.   ? ?PA# : [id number] ?Effective dates: 09/10/21 through 09/10/22 ? ?Charlann Boxer, CPhT ? ?

## 2021-09-10 NOTE — Patient Instructions (Addendum)
Thank you for coming in today ? ?Labs were done today, if any labs are abnormal the clinic will call you ?No news is good news  ? ?TAKE extra Torsemide 20 mg for a total of 40 mg for 2 day only  ? ?Your physician recommends that you schedule a follow-up appointment in:  ?4 months with Dr. Shirlee Latch with echocardiogram ? ?Your physician has requested that you have an echocardiogram. Echocardiography is a painless test that uses sound waves to create images of your heart. It provides your doctor with information about the size and shape of your heart and how well your heart?s chambers and valves are working. This procedure takes approximately one hour. There are no restrictions for this procedure. ?  ?At the [facility], you and your health needs are our priority. As part of our continuing mission to provide you with exceptional heart care, we have created designated Provider Care Teams. These Care Teams include your primary Cardiologist (physician) and Advanced Practice Providers (APPs- Physician Assistants and Nurse Practitioners) who all work together to provide you with the care you need, when you need it.  ? ?You may see any of the following providers on your designated Care Team at your next follow up: ?Dr Arvilla Meres ?Dr Marca Ancona ?Tonye Becket, NP ?Robbie Lis, PA ?Jessica Milford,NP ?Anna Genre, PA ?Karle Plumber, PharmD ? ? ?Please be sure to bring in all your medications bottles to every appointment.  ? ?If you have any questions or concerns before your next appointment please send [place] a message through [place] or call our office at [phone].   ? ?TO LEAVE A MESSAGE FOR THE NURSE SELECT OPTION 2, PLEASE LEAVE A MESSAGE INCLUDING: ?YOUR NAME ?DATE OF BIRTH ?CALL BACK NUMBER ?REASON FOR CALL**this is important as we prioritize the call backs ? ?YOU WILL RECEIVE A CALL BACK THE SAME DAY AS LONG AS YOU CALL BEFORE 4:00 PM ?(totla ?

## 2021-11-24 ENCOUNTER — Ambulatory Visit (INDEPENDENT_AMBULATORY_CARE_PROVIDER_SITE_OTHER): Payer: Self-pay

## 2021-11-24 DIAGNOSIS — I428 Other cardiomyopathies: Secondary | ICD-10-CM

## 2021-11-25 LAB — CUP PACEART REMOTE DEVICE CHECK
Battery Remaining Longevity: 54 mo
Battery Voltage: 2.97 V
Brady Statistic AP VP Percent: 0 %
Brady Statistic AP VS Percent: 0.14 %
Brady Statistic AS VP Percent: 0.06 %
Brady Statistic AS VS Percent: 99.8 %
Brady Statistic RA Percent Paced: 0.15 %
Brady Statistic RV Percent Paced: 0.06 %
Date Time Interrogation Session: 20230621043724
HighPow Impedance: 94 Ohm
Implantable Lead Implant Date: 20190814
Implantable Lead Implant Date: 20190814
Implantable Lead Location: 753859
Implantable Lead Location: 753860
Implantable Lead Model: 5076
Implantable Pulse Generator Implant Date: 20190814
Lead Channel Impedance Value: 380 Ohm
Lead Channel Impedance Value: 456 Ohm
Lead Channel Impedance Value: 532 Ohm
Lead Channel Pacing Threshold Amplitude: 0.875 V
Lead Channel Pacing Threshold Amplitude: 0.875 V
Lead Channel Pacing Threshold Pulse Width: 0.4 ms
Lead Channel Pacing Threshold Pulse Width: 0.4 ms
Lead Channel Sensing Intrinsic Amplitude: 15.25 mV
Lead Channel Sensing Intrinsic Amplitude: 15.25 mV
Lead Channel Sensing Intrinsic Amplitude: 4.25 mV
Lead Channel Sensing Intrinsic Amplitude: 4.25 mV
Lead Channel Setting Pacing Amplitude: 2 V
Lead Channel Setting Pacing Amplitude: 2.5 V
Lead Channel Setting Pacing Pulse Width: 0.4 ms
Lead Channel Setting Sensing Sensitivity: 0.3 mV

## 2021-12-06 NOTE — Progress Notes (Signed)
Remote ICD transmission.   

## 2022-01-16 ENCOUNTER — Emergency Department (HOSPITAL_COMMUNITY): Payer: Managed Care, Other (non HMO)

## 2022-01-16 ENCOUNTER — Encounter (HOSPITAL_COMMUNITY): Payer: Self-pay | Admitting: *Deleted

## 2022-01-16 ENCOUNTER — Other Ambulatory Visit: Payer: Self-pay

## 2022-01-16 ENCOUNTER — Inpatient Hospital Stay (HOSPITAL_COMMUNITY)
Admission: EM | Admit: 2022-01-16 | Discharge: 2022-01-19 | DRG: 309 | Disposition: A | Discharge status: Home / Self Care | Payer: Managed Care, Other (non HMO) | Attending: Cardiology | Admitting: Cardiology

## 2022-01-16 DIAGNOSIS — I272 Pulmonary hypertension, unspecified: Secondary | ICD-10-CM | POA: Diagnosis present

## 2022-01-16 DIAGNOSIS — Z7901 Long term (current) use of anticoagulants: Secondary | ICD-10-CM | POA: Diagnosis not present

## 2022-01-16 DIAGNOSIS — I2510 Atherosclerotic heart disease of native coronary artery without angina pectoris: Secondary | ICD-10-CM | POA: Diagnosis present

## 2022-01-16 DIAGNOSIS — Z9989 Dependence on other enabling machines and devices: Secondary | ICD-10-CM | POA: Diagnosis not present

## 2022-01-16 DIAGNOSIS — E785 Hyperlipidemia, unspecified: Secondary | ICD-10-CM | POA: Diagnosis present

## 2022-01-16 DIAGNOSIS — Z95810 Presence of automatic (implantable) cardiac defibrillator: Secondary | ICD-10-CM

## 2022-01-16 DIAGNOSIS — T447X6A Underdosing of beta-adrenoreceptor antagonists, initial encounter: Secondary | ICD-10-CM | POA: Diagnosis present

## 2022-01-16 DIAGNOSIS — I420 Dilated cardiomyopathy: Secondary | ICD-10-CM | POA: Diagnosis present

## 2022-01-16 DIAGNOSIS — Z801 Family history of malignant neoplasm of trachea, bronchus and lung: Secondary | ICD-10-CM | POA: Diagnosis not present

## 2022-01-16 DIAGNOSIS — I714 Abdominal aortic aneurysm, without rupture, unspecified: Secondary | ICD-10-CM | POA: Diagnosis present

## 2022-01-16 DIAGNOSIS — I5022 Chronic systolic (congestive) heart failure: Secondary | ICD-10-CM | POA: Diagnosis not present

## 2022-01-16 DIAGNOSIS — Z91128 Patient's intentional underdosing of medication regimen for other reason: Secondary | ICD-10-CM

## 2022-01-16 DIAGNOSIS — E876 Hypokalemia: Secondary | ICD-10-CM | POA: Diagnosis present

## 2022-01-16 DIAGNOSIS — I878 Other specified disorders of veins: Secondary | ICD-10-CM | POA: Diagnosis present

## 2022-01-16 DIAGNOSIS — Z6841 Body Mass Index (BMI) 40.0 and over, adult: Secondary | ICD-10-CM | POA: Diagnosis not present

## 2022-01-16 DIAGNOSIS — I5043 Acute on chronic combined systolic (congestive) and diastolic (congestive) heart failure: Secondary | ICD-10-CM

## 2022-01-16 DIAGNOSIS — M109 Gout, unspecified: Secondary | ICD-10-CM | POA: Diagnosis present

## 2022-01-16 DIAGNOSIS — I480 Paroxysmal atrial fibrillation: Secondary | ICD-10-CM | POA: Diagnosis present

## 2022-01-16 DIAGNOSIS — Z79899 Other long term (current) drug therapy: Secondary | ICD-10-CM | POA: Diagnosis not present

## 2022-01-16 DIAGNOSIS — I5020 Unspecified systolic (congestive) heart failure: Secondary | ICD-10-CM | POA: Diagnosis present

## 2022-01-16 DIAGNOSIS — I4729 Other ventricular tachycardia: Secondary | ICD-10-CM | POA: Diagnosis not present

## 2022-01-16 DIAGNOSIS — N1831 Chronic kidney disease, stage 3a: Secondary | ICD-10-CM | POA: Diagnosis present

## 2022-01-16 DIAGNOSIS — Z7985 Long-term (current) use of injectable non-insulin antidiabetic drugs: Secondary | ICD-10-CM

## 2022-01-16 DIAGNOSIS — I82491 Acute embolism and thrombosis of other specified deep vein of right lower extremity: Secondary | ICD-10-CM | POA: Diagnosis not present

## 2022-01-16 DIAGNOSIS — N183 Chronic kidney disease, stage 3 unspecified: Secondary | ICD-10-CM | POA: Diagnosis not present

## 2022-01-16 DIAGNOSIS — G4733 Obstructive sleep apnea (adult) (pediatric): Secondary | ICD-10-CM | POA: Diagnosis present

## 2022-01-16 DIAGNOSIS — I4892 Unspecified atrial flutter: Secondary | ICD-10-CM | POA: Diagnosis present

## 2022-01-16 DIAGNOSIS — Z833 Family history of diabetes mellitus: Secondary | ICD-10-CM

## 2022-01-16 DIAGNOSIS — I447 Left bundle-branch block, unspecified: Secondary | ICD-10-CM | POA: Diagnosis present

## 2022-01-16 DIAGNOSIS — G4730 Sleep apnea, unspecified: Secondary | ICD-10-CM

## 2022-01-16 DIAGNOSIS — I472 Ventricular tachycardia, unspecified: Principal | ICD-10-CM | POA: Diagnosis present

## 2022-01-16 DIAGNOSIS — Z4502 Encounter for adjustment and management of automatic implantable cardiac defibrillator: Principal | ICD-10-CM

## 2022-01-16 DIAGNOSIS — Z86718 Personal history of other venous thrombosis and embolism: Secondary | ICD-10-CM

## 2022-01-16 DIAGNOSIS — Z9884 Bariatric surgery status: Secondary | ICD-10-CM | POA: Diagnosis not present

## 2022-01-16 DIAGNOSIS — I4901 Ventricular fibrillation: Secondary | ICD-10-CM | POA: Diagnosis present

## 2022-01-16 DIAGNOSIS — Z8249 Family history of ischemic heart disease and other diseases of the circulatory system: Secondary | ICD-10-CM

## 2022-01-16 DIAGNOSIS — X58XXXA Exposure to other specified factors, initial encounter: Secondary | ICD-10-CM | POA: Diagnosis present

## 2022-01-16 LAB — CBC WITH DIFFERENTIAL/PLATELET
Abs Immature Granulocytes: 0.02 10*3/uL (ref 0.00–0.07)
Basophils Absolute: 0.1 10*3/uL (ref 0.0–0.1)
Basophils Relative: 1 %
Eosinophils Absolute: 0.2 10*3/uL (ref 0.0–0.5)
Eosinophils Relative: 3 %
HCT: 44.7 % (ref 39.0–52.0)
Hemoglobin: 15.5 g/dL (ref 13.0–17.0)
Immature Granulocytes: 0 %
Lymphocytes Relative: 23 %
Lymphs Abs: 1.6 10*3/uL (ref 0.7–4.0)
MCH: 33.2 pg (ref 26.0–34.0)
MCHC: 34.7 g/dL (ref 30.0–36.0)
MCV: 95.7 fL (ref 80.0–100.0)
Monocytes Absolute: 0.7 10*3/uL (ref 0.1–1.0)
Monocytes Relative: 10 %
Neutro Abs: 4.1 10*3/uL (ref 1.7–7.7)
Neutrophils Relative %: 63 %
Platelets: 189 10*3/uL (ref 150–400)
RBC: 4.67 MIL/uL (ref 4.22–5.81)
RDW: 12.6 % (ref 11.5–15.5)
WBC: 6.7 10*3/uL (ref 4.0–10.5)
nRBC: 0 % (ref 0.0–0.2)

## 2022-01-16 LAB — BASIC METABOLIC PANEL
Anion gap: 10 (ref 5–15)
BUN: 17 mg/dL (ref 6–20)
CO2: 22 mmol/L (ref 22–32)
Calcium: 8.7 mg/dL — ABNORMAL LOW (ref 8.9–10.3)
Chloride: 108 mmol/L (ref 98–111)
Creatinine, Ser: 1.52 mg/dL — ABNORMAL HIGH (ref 0.61–1.24)
GFR, Estimated: 54 mL/min — ABNORMAL LOW (ref >60)
Glucose, Bld: 142 mg/dL — ABNORMAL HIGH (ref 70–99)
Potassium: 3.1 mmol/L — ABNORMAL LOW (ref 3.5–5.1)
Sodium: 140 mmol/L (ref 135–145)

## 2022-01-16 LAB — HEPARIN LEVEL (UNFRACTIONATED)
Heparin Unfractionated: <0.1 IU/mL — ABNORMAL LOW (ref 0.30–0.70)
Heparin Unfractionated: 0.31 IU/mL (ref 0.30–0.70)

## 2022-01-16 LAB — TROPONIN I (HIGH SENSITIVITY)
Troponin I (High Sensitivity): 12 ng/L (ref <18)
Troponin I (High Sensitivity): 34 ng/L — ABNORMAL HIGH (ref <18)

## 2022-01-16 LAB — PROTIME-INR
INR: 1.1 (ref 0.8–1.2)
Prothrombin Time: 13.9 seconds (ref 11.4–15.2)

## 2022-01-16 LAB — APTT: aPTT: 29 seconds (ref 24–36)

## 2022-01-16 LAB — MAGNESIUM: Magnesium: 2 mg/dL (ref 1.7–2.4)

## 2022-01-16 LAB — CBG MONITORING, ED: Glucose-Capillary: 87 mg/dL (ref 70–99)

## 2022-01-16 LAB — TSH: TSH: 0.55 u[IU]/mL (ref 0.350–4.500)

## 2022-01-16 LAB — HIV ANTIBODY (ROUTINE TESTING W REFLEX): HIV Screen 4th Generation wRfx: NONREACTIVE

## 2022-01-16 LAB — POTASSIUM: Potassium: 3.4 mmol/L — ABNORMAL LOW (ref 3.5–5.1)

## 2022-01-16 LAB — MRSA NEXT GEN BY PCR, NASAL: MRSA by PCR Next Gen: NOT DETECTED

## 2022-01-16 MED ORDER — ACETAMINOPHEN 325 MG PO TABS: 650.0000 mg | ORAL_TABLET | ORAL | Status: AC

## 2022-01-16 MED ORDER — AMIODARONE HCL IN DEXTROSE 360-4.14 MG/200ML-% IV SOLN
30.0000 mg/h | INTRAVENOUS | Status: DC
  Administered 2022-01-17 (×3): 30 mg/h via INTRAVENOUS
  Filled 2022-01-16 (×4): qty 200

## 2022-01-16 MED ORDER — ORAL CARE MOUTH RINSE: 15.0000 mL | OROMUCOSAL | Status: DC | PRN

## 2022-01-16 MED ORDER — AMIODARONE HCL IN DEXTROSE 360-4.14 MG/200ML-% IV SOLN
60.0000 mg/h | INTRAVENOUS | Status: AC
  Administered 2022-01-16 (×2): 60 mg/h via INTRAVENOUS
  Filled 2022-01-16: qty 200

## 2022-01-16 MED ORDER — SACUBITRIL-VALSARTAN 49-51 MG PO TABS
1.0000 | ORAL_TABLET | Freq: Two times a day (BID) | ORAL | Status: DC
  Administered 2022-01-16 – 2022-01-19 (×6): 1 via ORAL
  Filled 2022-01-16 (×6): qty 1

## 2022-01-16 MED ORDER — METOPROLOL SUCCINATE ER 100 MG PO TB24
100.0000 mg | ORAL_TABLET | Freq: Two times a day (BID) | ORAL | Status: DC
  Administered 2022-01-17 – 2022-01-19 (×5): 100 mg via ORAL
  Filled 2022-01-16 (×2): qty 1
  Filled 2022-01-16 (×2): qty 2
  Filled 2022-01-16: qty 1

## 2022-01-16 MED ORDER — ADULT MULTIVITAMIN W/MINERALS CH
1.0000 | ORAL_TABLET | Freq: Every day | ORAL | Status: DC
  Administered 2022-01-17 – 2022-01-19 (×3): 1 via ORAL
  Filled 2022-01-16 (×3): qty 1

## 2022-01-16 MED ORDER — POTASSIUM CHLORIDE 10 MEQ/100ML IV SOLN
10.0000 meq | Freq: Once | INTRAVENOUS | Status: AC
  Administered 2022-01-16: 10 meq via INTRAVENOUS

## 2022-01-16 MED ORDER — ACETAMINOPHEN 325 MG PO TABS
ORAL_TABLET | ORAL | Status: AC
  Administered 2022-01-16: 650 mg via ORAL
  Filled 2022-01-16: qty 2

## 2022-01-16 MED ORDER — POTASSIUM CHLORIDE CRYS ER 20 MEQ PO TBCR: 40.0000 meq | EXTENDED_RELEASE_TABLET | Freq: Every day | ORAL | Status: DC

## 2022-01-16 MED ORDER — ACETAMINOPHEN 325 MG PO TABS: 650.0000 mg | ORAL_TABLET | ORAL | Status: DC | PRN

## 2022-01-16 MED ORDER — HEPARIN BOLUS VIA INFUSION
6500.0000 [IU] | Freq: Once | INTRAVENOUS | Status: AC
  Administered 2022-01-16: 6500 [IU] via INTRAVENOUS
  Filled 2022-01-16: qty 6500

## 2022-01-16 MED ORDER — POTASSIUM CHLORIDE 20 MEQ PO PACK
40.0000 meq | PACK | Freq: Once | ORAL | Status: AC
  Administered 2022-01-16: 40 meq via ORAL
  Filled 2022-01-16: qty 2

## 2022-01-16 MED ORDER — SPIRONOLACTONE 25 MG PO TABS
25.0000 mg | ORAL_TABLET | Freq: Every day | ORAL | Status: DC
  Administered 2022-01-17 – 2022-01-18 (×2): 25 mg via ORAL
  Filled 2022-01-16 (×2): qty 1

## 2022-01-16 MED ORDER — ALLOPURINOL 300 MG PO TABS
300.0000 mg | ORAL_TABLET | Freq: Every day | ORAL | Status: DC
  Administered 2022-01-17 – 2022-01-19 (×3): 300 mg via ORAL
  Filled 2022-01-16 (×3): qty 1

## 2022-01-16 MED ORDER — DAPAGLIFLOZIN PROPANEDIOL 10 MG PO TABS
10.0000 mg | ORAL_TABLET | Freq: Every day | ORAL | Status: DC
  Administered 2022-01-17 – 2022-01-19 (×3): 10 mg via ORAL
  Filled 2022-01-16 (×4): qty 1

## 2022-01-16 MED ORDER — POTASSIUM CHLORIDE 20 MEQ PO PACK
40.0000 meq | PACK | Freq: Once | ORAL | Status: AC
  Administered 2022-01-17: 40 meq via ORAL
  Filled 2022-01-16: qty 2

## 2022-01-16 MED ORDER — POTASSIUM CHLORIDE CRYS ER 20 MEQ PO TBCR: 60.0000 meq | EXTENDED_RELEASE_TABLET | Freq: Once | ORAL | Status: DC

## 2022-01-16 MED ORDER — AMIODARONE LOAD VIA INFUSION
150.0000 mg | Freq: Once | INTRAVENOUS | Status: AC
  Administered 2022-01-16: 150 mg via INTRAVENOUS
  Filled 2022-01-16: qty 83.34

## 2022-01-16 MED ORDER — HEPARIN (PORCINE) 25000 UT/250ML-% IV SOLN
2000.0000 [IU]/h | INTRAVENOUS | Status: AC
  Administered 2022-01-16 – 2022-01-17 (×3): 2000 [IU]/h via INTRAVENOUS
  Filled 2022-01-16 (×3): qty 250

## 2022-01-16 MED ORDER — POTASSIUM CHLORIDE CRYS ER 20 MEQ PO TBCR: 20.0000 meq | EXTENDED_RELEASE_TABLET | Freq: Every evening | ORAL | Status: DC

## 2022-01-16 MED ORDER — POTASSIUM CHLORIDE 10 MEQ/100ML IV SOLN
10.0000 meq | INTRAVENOUS | Status: AC
  Administered 2022-01-16: 10 meq via INTRAVENOUS
  Filled 2022-01-16 (×2): qty 100

## 2022-01-16 MED ORDER — METOPROLOL SUCCINATE ER 50 MG PO TB24
100.0000 mg | ORAL_TABLET | Freq: Every day | ORAL | Status: DC
  Administered 2022-01-16: 100 mg via ORAL
  Filled 2022-01-16: qty 4

## 2022-01-16 NOTE — Progress Notes (Addendum)
ANTICOAGULATION CONSULT NOTE - Initial Consult  Pharmacy Consult for Heparin Indication: atrial fibrillation  No Known Allergies  Patient Measurements: Height: 6\' 2"  (188 cm) Weight: (!) 203.4 kg (448 lb 6.7 oz) IBW/kg (Calculated) : 82.2 Heparin Dosing Weight: 132.9 kg  Vital Signs: Temp: 98.1 F (36.7 C) (08/13 1125) Temp Source: Oral (08/13 1125) BP: 125/90 (08/13 1415) Pulse Rate: 93 (08/13 1415)  Labs: Recent Labs    01/16/22 1121 01/16/22 1320  HGB 15.5  --   HCT 44.7  --   PLT 189  --   APTT 29  --   LABPROT 13.9  --   INR 1.1  --   CREATININE 1.52*  --   TROPONINIHS 12 34*    Estimated Creatinine Clearance: 102.7 mL/min (A) (by C-G formula based on SCr of 1.52 mg/dL (H)).   Medical History: Past Medical History:  Diagnosis Date   CHF (congestive heart failure) (HCC)    Dilated cardiomyopathy (HCC)    Pulmonary hypertension (HCC)     Medications:  (Not in a hospital admission)  Scheduled:   metoprolol succinate  100 mg Oral Daily   Infusions:   amiodarone 60 mg/hr (01/16/22 1328)   Followed by   amiodarone     potassium chloride 10 mEq (01/16/22 1426)   PRN:   Assessment: 56 yom with a history of nonischemic cardiomyopathy (?post viral) with an EF of 35% status post ICD (2019), DVT, AAA, CKD, OSA, obesity s/p gastric sleeve. Patient is presenting with multiple VT shocks. Heparin per pharmacy consult placed for atrial fibrillation.  There is some confusion about anticoagulation status. However, patient informs myself and med history technician that he is no longer on xarelto. Per patient "I was on for a blood clot, and my regular doctor said that I could stop when asked for last refill".  Hgb 15.5; plt 189 aPTT 29 PT/INR 13.6/1.1  Goal of Therapy:  Heparin level 0.3-0.7 units/ml Monitor platelets by anticoagulation protocol: Yes   Plan:  Will obtain baseline heparin level in the event patient is mistaken about xarelto status ---if  baseline is elevated would need aPTT monitoring and would indicate that patient was on Xarelto PTA ---if baseline is low/not-detectable would indicate not on xarelto pta  Give IV heparin 6500 units bolus x 1 Start heparin infusion at 2000 units/hr Check anti-Xa level in 6 hours and daily while on heparin Continue to monitor H&H and platelets  12-12-1998, PharmD, BCPS 01/16/2022 2:41 PM ED Clinical Pharmacist -  [phone]

## 2022-01-16 NOTE — Plan of Care (Signed)
  Problem: Education: Goal: Knowledge of General Education information will improve Description: Including pain rating scale, medication(s)/side effects and non-pharmacologic comfort measures Outcome: Progressing   Problem: Health Behavior/Discharge Planning: Goal: Ability to manage health-related needs will improve Outcome: Progressing   Problem: Clinical Measurements: Goal: Ability to maintain clinical measurements within normal limits will improve Outcome: Progressing Goal: Will remain free from infection Outcome: Progressing Goal: Diagnostic test results will improve Outcome: Progressing Goal: Respiratory complications will improve Outcome: Progressing Goal: Cardiovascular complication will be avoided Outcome: Progressing   Problem: Activity: Goal: Risk for activity intolerance will decrease Outcome: Progressing   Problem: Nutrition: Goal: Adequate nutrition will be maintained Outcome: Completed/Met   Problem: Coping: Goal: Level of anxiety will decrease Outcome: Progressing   Problem: Elimination: Goal: Will not experience complications related to bowel motility Outcome: Progressing Goal: Will not experience complications related to urinary retention Outcome: Completed/Met   Problem: Pain Managment: Goal: General experience of comfort will improve Outcome: Progressing   Problem: Safety: Goal: Ability to remain free from injury will improve Outcome: Progressing   Problem: Skin Integrity: Goal: Risk for impaired skin integrity will decrease Outcome: Progressing   Problem: Cardiac: Goal: Ability to achieve and maintain adequate cardiopulmonary perfusion will improve Outcome: Progressing
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
## 2022-01-16 NOTE — ED Provider Notes (Signed)
[facility] EMERGENCY DEPARTMENT Provider Note   CSN: [id number] Arrival date & time: 01/16/22  1116     History  Chief Complaint  Patient presents with   Pacemaker Problem    Mitchell Jennings. is a 56 y.o. male.  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  Patient states that he was in his usual state of health up into the defibrillation firing today.  Says that around 11 AM he was at the store when he felt very dizzy and dropped to his knees.  Did not have any head strike or LOC.  Says that it spontaneously resolved.  There is no chest pain or shortness of breath with this episode.  Says that they went home and that shortly afterwards he started feeling dizzy again and sat down and was shocked by his defibrillator.  Says that after that he was shocked 2 additional times and 1 time in the presence of EMS.  Still denies any chest pain or shortness of breath.  Says that he has been compliant with all of his medications except for not taking his metoprolol succinate ER 100 mg this morning.  No recent changes to his medications.  Not on any blood thinners or aspirin currently.  Follows with Dr. Trey Paula from cardiology here at the [facility] heart center.        Home Medications Prior to Admission medications   Medication Sig Start Date End Date Taking? Authorizing Provider  allopurinol (ZYLOPRIM) 300 MG tablet Take 300 mg by mouth daily. 12/27/21  Yes [provider]  dapagliflozin propanediol (FARXIGA) 10 MG TABS tablet Take 1 tablet (10 mg total) by mouth daily before breakfast. 09/10/21  Yes Milford, Maricela Bo, FNP  metoprolol succinate (TOPROL-XL) 100 MG 24 hr tablet Take 1 tablet (100 mg total) by mouth 2 (two) times daily. Take with or immediately following a meal. 09/10/21   Yes Milford, Maricela Bo, FNP  Multiple Vitamins-Minerals (CENTRUM MEN) TABS Take 1 tablet by mouth daily.   Yes [provider]  potassium chloride SA (KLOR-CON M) 20 MEQ tablet Take 2 tablets (40 mEq total) by mouth in the morning AND 1 tablet (20 mEq total) every evening. 09/10/21  Yes Milford, Maricela Bo, FNP  rosuvastatin (CRESTOR) 5 MG tablet Take 1 tablet (5 mg total) by mouth daily. 09/10/21  Yes Milford, Maricela Bo, FNP  sacubitril-valsartan (ENTRESTO) 49-51 MG Take 1 tablet by mouth 2 (two) times daily. 09/10/21  Yes Milford, Maricela Bo, FNP  Semaglutide (OZEMPIC, 0.25 OR 0.5 MG/DOSE, [place]) Inject 0.5 mg into the skin once a week.   Yes [provider]  spironolactone (ALDACTONE) 25 MG tablet Take 1 tablet (25 mg total) by mouth daily. 09/10/21  Yes Milford, Maricela Bo, FNP  rivaroxaban (XARELTO) 20 MG TABS tablet Take 1 tablet (20 mg total) by mouth daily with supper. Patient not taking: Reported on 01/16/2022 09/10/21   Rafael Bihari, FNP  torsemide (DEMADEX) 20 MG tablet Take 1 tablet (20 mg total) by mouth daily. Patient not taking: Reported on 01/16/2022 09/10/21   Rafael Bihari, FNP      Allergies    Patient has no known allergies.    Review of Systems   Review of Systems  Physical Exam Updated Vital Signs BP 92/70   Pulse 63  Temp 97.6 F (36.4 C) (Oral)   Resp (!) 38   Ht 6\' 2"  (1.88 m)   Wt (!) 199 kg   SpO2 95%   BMI 56.33 kg/m  Physical Exam Vitals and nursing note reviewed.  Constitutional:      General: He is not in acute distress.    Appearance: He is well-developed.  HENT:     Head: Normocephalic and atraumatic.     Mouth/Throat:     Mouth: Mucous membranes are moist.     Pharynx: Oropharynx is clear.  Eyes:     Conjunctiva/sclera: Conjunctivae normal.  Cardiovascular:     Rate and Rhythm: Normal rate and regular rhythm.     Heart sounds: No murmur heard.    Comments: ICD in left chest wall Pulmonary:     Effort: Pulmonary effort is  normal. No respiratory distress.     Breath sounds: Normal breath sounds.  Abdominal:     Palpations: Abdomen is soft.     Tenderness: There is no abdominal tenderness.  Musculoskeletal:        General: No swelling.     Cervical back: Neck supple.     Right lower leg: No edema.     Left lower leg: No edema.  Skin:    General: Skin is warm and dry.     Capillary Refill: Capillary refill takes less than 2 seconds.  Neurological:     Mental Status: He is alert.  Psychiatric:        Mood and Affect: Mood normal.     ED Results / Procedures / Treatments   Labs (all labs ordered are listed, but only abnormal results are displayed) Labs Reviewed  BASIC METABOLIC PANEL - Abnormal; Notable for the following components:      Result Value   Potassium 3.1 (*)    Glucose, Bld 142 (*)    Creatinine, Ser 1.52 (*)    Calcium 8.7 (*)    GFR, Estimated 54 (*)    All other components within normal limits  HEPARIN LEVEL (UNFRACTIONATED) - Abnormal; Notable for the following components:   Heparin Unfractionated <0.10 (*)    All other components within normal limits  BASIC METABOLIC PANEL - Abnormal; Notable for the following components:   Potassium 3.4 (*)    Glucose, Bld 107 (*)    Creatinine, Ser 1.46 (*)    Calcium 8.4 (*)    GFR, Estimated 57 (*)    All other components within normal limits  POTASSIUM - Abnormal; Notable for the following components:   Potassium 3.4 (*)    All other components within normal limits  GLUCOSE, CAPILLARY - Abnormal; Notable for the following components:   Glucose-Capillary 129 (*)    All other components within normal limits  TROPONIN I (HIGH SENSITIVITY) - Abnormal; Notable for the following components:   Troponin I (High Sensitivity) 34 (*)    All other components within normal limits  MRSA NEXT GEN BY PCR, NASAL  MAGNESIUM  APTT  PROTIME-INR  CBC WITH DIFFERENTIAL/PLATELET  TSH  HEPARIN LEVEL (UNFRACTIONATED)  HEPARIN LEVEL (UNFRACTIONATED)   HIV ANTIBODY (ROUTINE TESTING W REFLEX)  MAGNESIUM  GLUCOSE, CAPILLARY  GLUCOSE, CAPILLARY  CBC  BASIC METABOLIC PANEL  MAGNESIUM  HEMOGLOBIN A1C  CBG MONITORING, ED  TROPONIN I (HIGH SENSITIVITY)    EKG EKG Interpretation  Date/Time:  Sunday January 16 2022 11:20:49 EDT Ventricular Rate:  106 PR Interval:  179 QRS Duration: 146 QT Interval:  362 QTC Calculation: 481  R Axis:   -22 Text Interpretation: Sinus tachycardia Ventricular premature complex Left bundle branch block Confirmed by Margaretmary Eddy [phone]) on 01/16/2022 12:27:44 PM  Radiology VAS [place] LOWER EXTREMITY VENOUS (DVT)  Result Date: 01/17/2022  Lower Venous DVT Study Patient Name:  Haruki Burdick.  Date of Exam:   01/17/2022 Medical Rec #: [id number]             Accession #:    [id number] Date of Birth: 09-20-1965             Patient Gender: M Patient Age:   65 years Exam Location:  [facility] Procedure:      VAS [place] LOWER EXTREMITY VENOUS (DVT) Referring Phys: RENEE URSUY --------------------------------------------------------------------------------  Indications: Hx of DVT.  Comparison Study: 12/28/20 unilateral right negative. Performing Technologist: Bobetta Lime BS, RVT  Examination Guidelines: A complete evaluation includes B-mode imaging, spectral Doppler, color Doppler, and power Doppler as needed of all accessible portions of each vessel. Bilateral testing is considered an integral part of a complete examination. Limited examinations for reoccurring indications may be performed as noted. The reflux portion of the exam is performed with the patient in reverse Trendelenburg.  +---------+---------------+---------+-----------+----------+--------------+ RIGHT    CompressibilityPhasicitySpontaneityPropertiesThrombus Aging +---------+---------------+---------+-----------+----------+--------------+ CFV      Full           Yes      Yes                                  +---------+---------------+---------+-----------+----------+--------------+ SFJ      Full                                                        +---------+---------------+---------+-----------+----------+--------------+ FV Prox  Full                                                        +---------+---------------+---------+-----------+----------+--------------+ FV Mid   Full                                                        +---------+---------------+---------+-----------+----------+--------------+ FV DistalFull                                                        +---------+---------------+---------+-----------+----------+--------------+ PFV      Full                                                        +---------+---------------+---------+-----------+----------+--------------+ POP      Full           Yes      Yes                                 +---------+---------------+---------+-----------+----------+--------------+  PTV      Full                    Yes                                 +---------+---------------+---------+-----------+----------+--------------+ PERO     Full                    Yes                                 +---------+---------------+---------+-----------+----------+--------------+   +---------+---------------+---------+-----------+----------+--------------+ LEFT     CompressibilityPhasicitySpontaneityPropertiesThrombus Aging +---------+---------------+---------+-----------+----------+--------------+ CFV      Full           Yes      Yes                                 +---------+---------------+---------+-----------+----------+--------------+ SFJ      Full                                                        +---------+---------------+---------+-----------+----------+--------------+ FV Prox  Full                                                         +---------+---------------+---------+-----------+----------+--------------+ FV Mid   Full                                                        +---------+---------------+---------+-----------+----------+--------------+ FV DistalFull                                                        +---------+---------------+---------+-----------+----------+--------------+ PFV      Full                                                        +---------+---------------+---------+-----------+----------+--------------+ POP      Full           Yes      Yes                                 +---------+---------------+---------+-----------+----------+--------------+ PTV      Full                    Yes                                 +---------+---------------+---------+-----------+----------+--------------+  PERO     Full                    Yes                                 +---------+---------------+---------+-----------+----------+--------------+   Left Technical Findings: The   Summary: BILATERAL: - No evidence of deep vein thrombosis seen in the lower extremities, bilaterally. -No evidence of popliteal cyst, bilaterally.   *See table(s) above for measurements and observations. Electronically signed by Servando Snare MD on 01/17/2022 at 5:43:37 PM.    Final    ECHOCARDIOGRAM COMPLETE  Result Date: 01/17/2022    ECHOCARDIOGRAM REPORT   Patient Name:   Kedrick Jesmer. Date of Exam: 01/17/2022 Medical Rec #:  [id number]            Height:       74.0 in Accession #:    [id number]           Weight:       438.7 lb Date of Birth:  1965/11/23            BSA:          3.035 m Patient Age:    42 years             BP:           101/72 mmHg Patient Gender: M                    HR:           71 bpm. Exam Location:  Inpatient Procedure: 2D Echo, Color Doppler, Cardiac Doppler and Intracardiac            Opacification Agent Indications:    I47.2 Ventricular tachycardia  History:        Patient has  prior history of Echocardiogram examinations, most                 recent 04/08/2020. CHF, Defibrillator, Pulmonary HTN,                 Arrythmias:LBBB; Risk Factors:Sleep Apnea.  Sonographer:    Raquel Sarna Senior RDCS Referring Phys: [id number] SCOTT T WEAVER  Sonographer Comments: Technically difficult study due to poor echo windows. Technically difficult due to patient body habitus. IMPRESSIONS  1. Left ventricular ejection fraction, by estimation, is 25 to 30%. The left ventricle has severely decreased function. The left ventricle demonstrates global hypokinesis. The left ventricular internal cavity size was moderately dilated. Left ventricular diastolic parameters are consistent with Grade II diastolic dysfunction (pseudonormalization). Elevated left ventricular end-diastolic pressure. The E/e' is 46.  2. Right ventricular systolic function is low normal. The right ventricular size is normal.  3. The mitral valve is abnormal. Trivial mitral valve regurgitation.  4. The aortic valve was not well visualized. Aortic valve regurgitation is not visualized.  5. The inferior vena cava is normal in size with greater than 50% respiratory variability, suggesting right atrial pressure of 3 mmHg. Comparison(s): Changes from prior study are noted. 04/08/2020: LVEF ~35%. FINDINGS  Left Ventricle: Left ventricular ejection fraction, by estimation, is 25 to 30%. The left ventricle has severely decreased function. The left ventricle demonstrates global hypokinesis. Definity contrast agent was given IV to delineate the left ventricular endocardial borders. The left ventricular internal cavity size was moderately dilated. There is no left ventricular hypertrophy. Left ventricular diastolic parameters are  consistent with Grade II diastolic dysfunction (pseudonormalization). Elevated left ventricular end-diastolic pressure. The E/e' is 16. Right Ventricle: The right ventricular size is normal. No increase in right ventricular wall thickness.  Right ventricular systolic function is low normal. Left Atrium: Left atrial size was normal in size. Right Atrium: Right atrial size was normal in size. Pericardium: Trivial pericardial effusion is present. The pericardial effusion is circumferential. Mitral Valve: The mitral valve is abnormal. Mild mitral annular calcification. Trivial mitral valve regurgitation. Tricuspid Valve: The tricuspid valve is grossly normal. Tricuspid valve regurgitation is not demonstrated. Aortic Valve: The aortic valve was not well visualized. Aortic valve regurgitation is not visualized. Pulmonic Valve: The pulmonic valve was normal in structure. Pulmonic valve regurgitation is not visualized. Aorta: The aortic root and ascending aorta are structurally normal, with no evidence of dilitation. Venous: The inferior vena cava is normal in size with greater than 50% respiratory variability, suggesting right atrial pressure of 3 mmHg. IAS/Shunts: No atrial level shunt detected by color flow Doppler. Additional Comments: A device lead is visualized.  LEFT VENTRICLE PLAX 2D LVIDd:         6.50 cm   Diastology LVIDs:         5.60 cm   LV e' medial:    4.24 cm/s LV PW:         1.10 cm   LV E/e' medial:  23.2 LV IVS:        0.90 cm   LV e' lateral:   4.35 cm/s LVOT diam:     2.10 cm   LV E/e' lateral: 22.6 LV SV:         63 LV SV Index:   21 LVOT Area:     3.46 cm  RIGHT VENTRICLE RV S prime:     10.90 cm/s TAPSE (M-mode): 2.0 cm LEFT ATRIUM              Index        RIGHT ATRIUM           Index LA diam:        3.30 cm  1.09 cm/m   RA Area:     20.40 cm LA Vol (A2C):   88.7 ml  29.23 ml/m  RA Volume:   56.60 ml  18.65 ml/m LA Vol (A4C):   99.0 ml  32.62 ml/m LA Biplane Vol: 100.0 ml 32.95 ml/m  AORTIC VALVE LVOT Vmax:   96.60 cm/s LVOT Vmean:  74.700 cm/s LVOT VTI:    0.183 m  AORTA Ao Root diam: 3.70 cm Ao Asc diam:  3.60 cm MITRAL VALVE MV Area (PHT): 3.81 cm    SHUNTS MV Decel Time: 199 msec    Systemic VTI:  0.18 m MV E velocity:  98.50 cm/s  Systemic Diam: 2.10 cm MV A velocity: 66.00 cm/s MV E/A ratio:  1.49 Lyman Bishop MD Electronically signed by Lyman Bishop MD Signature Date/Time: 01/17/2022/1:57:43 PM    Final    DG Chest Portable 1 View  Result Date: 01/16/2022 CLINICAL DATA:  Pacemaker malfunction. EXAM: PORTABLE CHEST 1 VIEW COMPARISON:  Two-view chest x-ray 01/18/2018 FINDINGS: Heart is enlarged. No edema or effusion is present. Lung volumes are low. No focal airspace disease present. Pacemaker control unit and pacing wires are stable. IMPRESSION: 1. Stable cardiomegaly without failure. 2. No acute cardiopulmonary disease. Electronically Signed   By: San Morelle M.D.   On: 01/16/2022 11:55    Procedures Procedures   Medications Ordered in ED Medications  amiodarone (NEXTERONE PREMIX) 360-4.14 MG/200ML-% (1.8 mg/mL) IV infusion (0 mg/hr Intravenous Stopped 01/16/22 2000)    Followed by  amiodarone (NEXTERONE PREMIX) 360-4.14 MG/200ML-% (1.8 mg/mL) IV infusion (30 mg/hr Intravenous New Bag/Given 01/17/22 1305)  allopurinol (ZYLOPRIM) tablet 300 mg (300 mg Oral Given 01/17/22 0906)  metoprolol succinate (TOPROL-XL) 24 hr tablet 100 mg (100 mg Oral Given 01/17/22 0907)  sacubitril-valsartan (ENTRESTO) 49-51 mg per tablet (1 tablet Oral Given 01/17/22 0906)  spironolactone (ALDACTONE) tablet 25 mg (25 mg Oral Given 01/17/22 0906)  dapagliflozin propanediol (FARXIGA) tablet 10 mg (10 mg Oral Given 01/17/22 1108)  multivitamin with minerals tablet 1 tablet (1 tablet Oral Given 01/17/22 0906)  acetaminophen (TYLENOL) tablet 650 mg (has no administration in time range)  heparin ADULT infusion 100 units/mL (25000 units/25mL) (0 Units/hr Intravenous Stopped 01/17/22 1826)  Oral care mouth rinse (has no administration in time range)  Chlorhexidine Gluconate Cloth 2 % PADS 6 each (6 each Topical Given 01/17/22 0909)  rivaroxaban (XARELTO) tablet 20 mg (20 mg Oral Given 01/17/22 1824)  perflutren lipid microspheres  (DEFINITY) IV suspension (4 mLs Intravenous Given 01/17/22 1142)  amiodarone (NEXTERONE) 1.8 mg/mL load via infusion 150 mg (150 mg Intravenous Bolus from Bag 01/16/22 1329)  potassium chloride (KLOR-CON) packet 40 mEq (40 mEq Oral Given 01/16/22 1356)  potassium chloride 10 mEq in 100 mL IVPB (0 mEq Intravenous Duplicate 01/16/22 1830)  acetaminophen (TYLENOL) tablet 650 mg (650 mg Oral Given 01/16/22 1417)  heparin bolus via infusion 6,500 Units (6,500 Units Intravenous Bolus from Bag 01/16/22 1530)  potassium chloride 10 mEq in 100 mL IVPB (0 mEq Intravenous Stopped 01/16/22 1915)  potassium chloride (KLOR-CON) packet 40 mEq (40 mEq Oral Given 01/17/22 0053)  potassium chloride SA (KLOR-CON M) CR tablet 40 mEq (40 mEq Oral Given 01/17/22 1549)    ED Course/ Medical Decision Making/ A&P Clinical Course as of 01/17/22 1900  Sun Jan 16, 2022  1226 CXR reviewed and interpreted by me as showing no acute abnormality and shows ICD in place. [RP]  1231 [employer] defibrillator interrogated and did show 3 episodes of ventricular fibrillation with successful defibrillation.  Also showed 1 aborted charge. [RP]  1253 Discussed with Dr. Shari Prows who will come evaluate the patient.  They will likely admit the patient for further management. [RP]    Clinical Course User Index [RP] Rondel Baton, MD                           Medical Decision Making Amount and/or Complexity of Data Reviewed Labs: ordered. Radiology: ordered.  Risk OTC drugs. Prescription drug management. Decision regarding hospitalization.   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   Initial DDx: Ventricular arrhythmia, inappropriate firing of ICD, ACS   Plan:  Labs Electrolytes Troponin Chest x-ray Device interrogation  ED Summary:   After the patient underwent the above work-up [employer] called and faxed report and the patient had had 3 firings of his ICD for ventricular fibrillation.  Electrolytes did reveal that his potassium was low but magnesium was at goal of 2.0.  He was given oral potassium.  He was also loaded with amiodarone to prevent additional episodes of ventricular tachycardia.  EKG showed only old left bundle with normal sinus rhythm.  Initial troponin was 12.  Cardiology  then agreed to admit the patient for further management.  Consults: -Cardiology    Additional history obtained from family and spouse Records reviewed previous admission documents and Care Everywhere/External Records  I independently visualized the following imaging with scope of interpretation limited to determining acute life threatening conditions related to emergency care: Chest x-ray, which revealed ICD without additional acute abnormality  Final Clinical Impression(s) / ED Diagnoses Final diagnoses:  Ventricular fibrillation (HCC)  ICD (implantable cardioverter-defibrillator) discharge  Hypokalemia    Rx / DC Orders ED Discharge Orders     None         Rondel Baton, MD 01/17/22 1900

## 2022-01-16 NOTE — Progress Notes (Signed)
ANTICOAGULATION CONSULT NOTE   Pharmacy Consult for Heparin Indication: atrial fibrillation  No Known Allergies  Patient Measurements: Height: 6\' 2"  (188 cm) Weight: (!) 203.4 kg (448 lb 6.7 oz) IBW/kg (Calculated) : 82.2 Heparin Dosing Weight: 132.9 kg  Vital Signs: Temp: 98.2 F (36.8 C) (08/13 1958) Temp Source: Oral (08/13 1958) BP: 105/69 (08/13 1953) Pulse Rate: 88 (08/13 1953)  Labs: Recent Labs    01/16/22 1121 01/16/22 1320 01/16/22 1523 01/16/22 2110  HGB 15.5  --   --   --   HCT 44.7  --   --   --   PLT 189  --   --   --   APTT 29  --   --   --   LABPROT 13.9  --   --   --   INR 1.1  --   --   --   HEPARINUNFRC  --   --  <0.10* 0.31  CREATININE 1.52*  --   --   --   TROPONINIHS 12 34*  --   --      Estimated Creatinine Clearance: 102.7 mL/min (A) (by C-G formula based on SCr of 1.52 mg/dL (H)).   Medical History: Past Medical History:  Diagnosis Date   CHF (congestive heart failure) (HCC)    Dilated cardiomyopathy (HCC)    Pulmonary hypertension (HCC)     Medications:    Scheduled:   Infusions:   amiodarone 30 mg/hr (01/16/22 2000)   heparin 2,000 Units/hr (01/16/22 1530)   PRN:   Assessment: 56 yom with a history of nonischemic cardiomyopathy (?post viral) with an EF of 35% status post ICD (2019), DVT, AAA, CKD, OSA, obesity s/p gastric sleeve. Patient is presenting with multiple VT shocks. Heparin per pharmacy consult placed for atrial fibrillation.  There is some confusion about anticoagulation status. However, patient informs myself and med history technician that he is no longer on xarelto. Per patient "I was on for a blood clot, and my regular doctor said that I could stop when asked for last refill".  Hgb 15.5; plt 189 aPTT 29 PT/INR 13.6/1.1  PM f/u > heparin level now at goal at 0.31.  No overt bleeding or complications noted.  Goal of Therapy:  Heparin level 0.3-0.7 units/ml Monitor platelets by anticoagulation protocol:  Yes   Plan:  Continue heparin at 2000 units/hr. Recheck anti-Xa level in 6 hours and daily while on heparin Continue to monitor H&H and platelets  12-12-1998, Reece Leader, BCPS, BCCP Clinical Pharmacist  01/16/2022 9:57 PM   [facility] pharmacy phone numbers are listed on [email or web address]

## 2022-01-16 NOTE — ED Notes (Signed)
[employer] defibrillator interrogated.

## 2022-01-16 NOTE — Progress Notes (Signed)
Called ED in attempt to get report. ED RN unavailable and with trauma at this time.

## 2022-01-16 NOTE — H&P (Addendum)
Cardiology Admission History and Physical:   Patient ID: Mitchell Jennings. MRN: [id number]; DOB: 1966-05-11   Admission date: 01/16/2022  PCP:  [name], MD   [facility] Providers Cardiologist:  None  Electrophysiologist:  Will Jorja Loa, MD      Chief Complaint:  Multiple VT shocks  Patient Profile:   Mitchell Jennings. is a 56 y.o. male with HFrEF EF 35% (recovered from 20%), super-morbid obesity s/p gastric bypass, OSA on CPAP, provoked and unprovoked DVT no long on [facility] who is being seen 01/16/2022 for the evaluation of VT Storm.  History of Present Illness:   Mitchell Jennings has been doing well from HF in 2023.  He was first seen in 2018-2019, LVEF 20-25% with mild RV dysfunction.  He had recovery to 30% in 2019 with mild non obstructive CAD.  He had a primary prevention ICD and had never had ventricular tachyarrhythmia (he has had short AT/AF).  He has had frequent PVCs even as of 2023. Seen in 2023 (April) and was doing well. No CP, SOB, palpitations, syncope.  At last eval differential for his HF was post viral vs selenium deficiency post gastric bypass.  Patient was in his usually state of health today (did forget his succinate today), recently on ozempic and down 12 lbs.  Went to [employer] and felt dizzy.  No CP or SOB.  Felt the same way again and ICD shock (confirmed by remote monitoring). Was shown to have 3 total shocks.  Third was witnessed by EMS.  BIBEMS. He had an episodes of Atrial fibrillation rates 260, he and ATP He had an episodes of dual tachycardia. Had a shock He had Episode of 1:1 tachycardia; he received two shocks. This is per report from [employer] team.  They are sending the initiation and termination sequences of each event.  In ED troponin 12.  K 3.1 with normal Mg.  Frequent PVCs on Telemetry. EKG Sinus tachycardiac with LBBB and rare PVCs  He is set to AT/AF monitor above 170 He is set to VF > 200 ATP before charging, 35 J shock  Present  he feels well, but is worried about his heart.  Past Medical History:  Diagnosis Date   CHF (congestive heart failure) (HCC)    Dilated cardiomyopathy (HCC)    Pulmonary hypertension (HCC)     Past Surgical History:  Procedure Laterality Date   BARIATRIC SURGERY     ICD IMPLANT N/A 01/17/2018   Procedure: ICD IMPLANT;  Surgeon: Regan Lemming, MD;  Location: [facility] INVASIVE CV LAB;  Service: Cardiovascular;  Laterality: N/A;   RIGHT/LEFT HEART CATH AND CORONARY ANGIOGRAPHY N/A 07/31/2017   Procedure: RIGHT/LEFT HEART CATH AND CORONARY ANGIOGRAPHY;  Surgeon: Yvonne Kendall, MD;  Location: [facility] INVASIVE CV LAB;  Service: Cardiovascular;  Laterality: N/A;   UMBILICAL HERNIA REPAIR       Medications Prior to Admission: Prior to Admission medications   Medication Sig Start Date End Date Taking? Authorizing Provider  acetaminophen (TYLENOL) 325 MG tablet Take 650 mg by mouth every 6 (six) hours as needed for mild pain or headache.     [provider]  allopurinol (ZYLOPRIM) 100 MG tablet Take 100 mg by mouth daily.    [provider]  colchicine 0.6 MG tablet Take 0.6 mg by mouth 2 (two) times daily.    [provider]  dapagliflozin propanediol (FARXIGA) 10 MG TABS tablet Take 1 tablet (10 mg total) by mouth daily before breakfast. 09/10/21  [place], Anderson Malta, FNP  metoprolol succinate (TOPROL-XL) 100 MG 24 hr tablet Take 1 tablet (100 mg total) by mouth 2 (two) times daily. Take with or immediately following a meal. 09/10/21   [place], Anderson Malta, FNP  Multiple Vitamins-Minerals (CENTRUM MEN) TABS Take 1 tablet by mouth daily.    [provider]  potassium chloride SA (KLOR-CON M) 20 MEQ tablet Take 2 tablets (40 mEq total) by mouth in the morning AND 1 tablet (20 mEq total) every evening. 09/10/21   [place], Anderson Malta, FNP  Rivaroxaban (XARELTO PO) Take 1 tablet by mouth daily in the afternoon.    [provider]  rivaroxaban (XARELTO) 20 MG TABS  tablet Take 1 tablet (20 mg total) by mouth daily with supper. 09/10/21   [place], Anderson Malta, FNP  rosuvastatin (CRESTOR) 5 MG tablet Take 1 tablet (5 mg total) by mouth daily. 09/10/21   [place], Anderson Malta, FNP  sacubitril-valsartan (ENTRESTO) 49-51 MG Take 1 tablet by mouth 2 (two) times daily. 09/10/21   [place], Anderson Malta, FNP  Semaglutide (OZEMPIC, 0.25 OR 0.5 MG/DOSE, [place]) Inject 0.5 mg into the skin once a week.    [provider]  spironolactone (ALDACTONE) 25 MG tablet Take 1 tablet (25 mg total) by mouth daily. 09/10/21   Jacklynn Ganong, FNP  torsemide (DEMADEX) 20 MG tablet Take 1 tablet (20 mg total) by mouth daily. 09/10/21   [place], Anderson Malta, FNP     Allergies:   No Known Allergies  Social History:   Social History   Socioeconomic History   Marital status: Married    Spouse name: Not on file   Number of children: Not on file   Years of education: Not on file   Highest education level: Not on file  Occupational History   Not on file  Tobacco Use   Smoking status: Never   Smokeless tobacco: Never  Vaping Use   Vaping Use: Never used  Substance and Sexual Activity   Alcohol use: No   Drug use: No   Sexual activity: Not on file  Other Topics Concern   Not on file  Social History Narrative   Not on file   Social Determinants of Health   Financial Resource Strain: Not on file  Food Insecurity: Not on file  Transportation Needs: Not on file  Physical Activity: Not on file  Stress: Not on file  Social Connections: Not on file  Intimate Partner Violence: Not on file    Family History:   The patient's family history includes Diabetes type II in his brother and father; Heart failure in his mother; Hypertension in his mother; Lung cancer in his mother.    ROS:  Please see the history of present illness.  All other ROS reviewed and negative.     Physical Exam/Data:   Vitals:   01/16/22 1128 01/16/22 1145 01/16/22 1200 01/16/22 1300  BP:  112/87 116/85  109/80  Pulse:  (!) 103 (!) 104 99  Resp:  (!) 22 (!) 21 17  Temp:      TempSrc:      SpO2:  97% 97% 99%  Weight: (!) 203.4 kg     Height: 6\' 2"  (1.88 m)      No intake or output data in the 24 hours ending 01/16/22 1410    01/16/2022   11:28 AM 09/10/2021    9:44 AM 11/30/2020    9:32 AM  Last 3 Weights  Weight (lbs) 448 lb 6.7 oz  448 lb 6.4 oz 454 lb 3.2 oz  Weight (kg) 203.4 kg 203.393 kg 206.024 kg     Body mass index is 57.57 kg/m.   Gen: no distress, morbid obesity   Neck: No JVD, thick neck  Cardiac: No Rubs or Gallops, no Murmur,  IRIR with distant heart sounds Respiratory: Clear to auscultation bilaterally, normal effort, normal  respiratory rate, decreased breath sounds in bases GI: Soft, nontender, non-distended  MS: Non pitting bilateral edema;  moves all extremities Integument: Skin feels warm Neuro:  At time of evaluation, alert and oriented to person/place/time/situation  Psych: Normal affect, patient feels ok  RIGHT/LEFT HEART CATH AND CORONARY ANGIOGRAPHY 07/31/2017  Narrative Conclusions: 1. No angiographically significant coronary artery disease, consistent with non-ischemic cardiomyopathy. 2. Moderately elevated right heart filling pressures. 3. Moderately to severely elevated left heart filling pressures. 4. Severe pulmonary hypertension. 5. Low Fick cardiac output/index.  Recommendations: 1. Increase furosemide to 80 mg BID with repeat BMP at the end of this week or early next week, given chronic kidney disease. 2. Close outpatient follow-up for optimization of evidence-based heart failure therapy.  Yvonne Kendall, MD [facility] Pager: [phone]   No results found for this or any previous visit from the past 3650 days.   No results found for this or any previous visit from the past 3650 days.   No results found for this or any previous visit from the past 3650 days.   No results found for this or any previous visit from the past 3650  days.   ECHO COMPLETE WO IMAGING ENHANCING AGENT 04/08/2020  Narrative ECHOCARDIOGRAM REPORT    Patient Name:   Mitchell Jennings. Date of Exam: 04/08/2020 Medical Rec #:  [id number]            Height:       74.0 in Accession #:    [id number]           Weight:       454.8 lb Date of Birth:  31-Jan-1966            BSA:          3.082 m Patient Age:    54 years             BP:           104/78 mmHg Patient Gender: M                    HR:           87 bpm. Exam Location:  [place]  Procedure: 2D Echo, Cardiac Doppler and Color Doppler  Indications:     I50.22 CHF  History:         Patient has prior history of Echocardiogram examinations, most recent 12/18/2017. CHF and Cardiomyopathy, ICD, Pulmonary HTN; Risk Factors:Morbidly obese, Sleep Apnea, Hypertension, Dyslipidemia and Non-Smoker.  Sonographer:     Jake Seats RDMS, RVT, RDCS Referring Phys:  [id number] AMY D CLEGG Diagnosing Phys: Nona Dell MD   Sonographer Comments: Technically difficult study due to poor echo windows and patient is morbidly obese. Image acquisition challenging due to patient body habitus. IMPRESSIONS   1. Limited images. Suggest Definity contrast study for better assessment of LV function (cardiac MRI would be another consideration if ICD is compatible). 2. Left ventricular ejection fraction, by estimation, is approximately 35% based on very limited views. The left ventricle has moderately decreased function. Left ventricular endocardial border not optimally defined to evaluate regional  wall motion. The left ventricular internal cavity size was moderately to severely dilated. Left ventricular diastolic parameters are indeterminate. 3. Normal RV-RA gradient of 17 mmHg. Right ventricular systolic function is normal. The right ventricular size is normal. A device wire is visualized. 4. The mitral valve is grossly normal. Trivial mitral valve regurgitation. 5. The aortic valve is tricuspid. Aortic valve  regurgitation is not visualized. 6. Unable to estimate CVP.  FINDINGS Left Ventricle: Left ventricular ejection fraction, by estimation, is 35%. The left ventricle has moderately decreased function. Left ventricular endocardial border not optimally defined to evaluate regional wall motion. The left ventricular internal cavity size was moderately to severely dilated. There is no left ventricular hypertrophy. Left ventricular diastolic parameters are indeterminate.  Right Ventricle: Normal RV-RA gradient of 17 mmHg. The right ventricular size is normal. No increase in right ventricular wall thickness. Right ventricular systolic function is normal.  Left Atrium: Left atrial size was normal in size.  Right Atrium: Right atrial size was normal in size.  Pericardium: There is no evidence of pericardial effusion. Presence of pericardial fat pad.  Mitral Valve: The mitral valve is grossly normal. Trivial mitral valve regurgitation.  Tricuspid Valve: The tricuspid valve is not well visualized. Tricuspid valve regurgitation is trivial.  Aortic Valve: The aortic valve is tricuspid. There is mild aortic valve annular calcification. Aortic valve regurgitation is not visualized.  Pulmonic Valve: The pulmonic valve was not well visualized. Pulmonic valve regurgitation is trivial.  Aorta: The aortic root is normal in size and structure.  Venous: Unable to estimate CVP. The inferior vena cava was not well visualized.  IAS/Shunts: The interatrial septum was not well visualized.  Additional Comments: A device wire is visualized.   LEFT VENTRICLE PLAX 2D LVIDd:         7.08 cm      Diastology LVIDs:         5.71 cm      LV e' medial:    6.85 cm/s LV PW:         1.03 cm      LV E/e' medial:  9.6 LV IVS:        0.66 cm      LV e' lateral:   8.22 cm/s LVOT diam:     2.20 cm      LV E/e' lateral: 8.0 LV SV:         72 LV SV Index:   23 LVOT Area:     3.80 cm  LV Volumes (MOD) LV vol d, MOD A4C:  348.0 ml LV vol s, MOD A4C: 232.0 ml LV SV MOD A4C:     348.0 ml  RIGHT VENTRICLE RV S prime:     12.60 cm/s TAPSE (M-mode): 2.0 cm  LEFT ATRIUM             Index LA diam:        3.50 cm 1.14 cm/m LA Vol (A2C):   65.2 ml 21.16 ml/m LA Vol (A4C):   62.6 ml 20.31 ml/m LA Biplane Vol:         21.20 ml/m AORTIC VALVE LVOT Vmax:   99.00 cm/s LVOT Vmean:  73.500 cm/s LVOT VTI:    0.190 m  AORTA Ao Root diam: 3.80 cm  TRICUSPID VALVE MITRAL VALVE                TR Peak grad:   16.6 mmHg MV Decel Time: 106 msec     TR Vmax:  204.00 cm/s MR Peak grad: 21.5 mmHg MR Vmax:      232.00 cm/s   SHUNTS MV E velocity: 65.50 cm/s   Systemic VTI:  0.19 m MV A velocity: 111.00 cm/s  Systemic Diam: 2.20 cm MV E/A raNona DellSamuel Mcdowell MD Electronically signed by Nona Dell MD Signature Date/Time: 04/08/2020/11:37:35 AM    Final      Laboratory Data:  High Sensitivity Troponin:   Recent Labs  Lab 01/16/22 1121  TROPONINIHS 12      Chemistry Recent Labs  Lab 01/16/22 1121  NA 140  K 3.1*  CL 108  CO2 22  GLUCOSE 142*  BUN 17  CREATININE 1.52*  CALCIUM 8.7*  MG 2.0  GFRNONAA 54*  ANIONGAP 10    No results for input(s): "PROT", "ALBUMIN", "AST", "ALT", "ALKPHOS", "BILITOT" in the last 168 hours. Lipids No results for input(s): "CHOL", "TRIG", "HDL", "LABVLDL", "LDLCALC", "CHOLHDL" in the last 168 hours. Hematology Recent Labs  Lab 01/16/22 1121  WBC 6.7  RBC 4.67  HGB 15.5  HCT 44.7  MCV 95.7  MCH 33.2  MCHC 34.7  RDW 12.6  PLT 189   Thyroid No results for input(s): "TSH", "FREET4" in the last 168 hours. BNPNo results for input(s): "BNP", "PROBNP" in the last 168 hours.  DDimer No results for input(s): "DDIMER" in the last 168 hours.   Radiology/Studies:  DG Chest Portable 1 View  Result Date: 01/16/2022 CLINICAL DATA:  Pacemaker malfunction. EXAM: PORTABLE CHEST 1 VIEW COMPARISON:  Two-view chest x-ray 01/18/2018 FINDINGS: Heart  is enlarged. No edema or effusion is present. Lung volumes are low. No focal airspace disease present. Pacemaker control unit and pacing wires are stable. IMPRESSION: 1. Stable cardiomegaly without failure. 2. No acute cardiopulmonary disease. Electronically Signed   By: Marin Roberts M.D.   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 Hx of Provoked DVT and unprovoked DVT Complicated by super-morbid obesity Known LBBB - DDX include hypokalemia, worsening HF; he has only missed one BB dosing - returned metoprolol succinate in the ED - s/p Amiodarone bolus, getting IV amiodarone - Mg 2 - K as below - will go to ICU, if quiescent can go to floor 01/17/22 - getting initiation and termination sequences - consulting EP - he has never been on therapy for atrial arrhythmia, will start heparin given the above and until we can clarify his AF burden  HFrEF - slightly hypervolemic given optivol - will return his succinate now, will return his home MRA, SGLT2i, and Entresto; he has symptomatic orthostasis at the highest dose  Hypokalemia - aggressive repletion in the setting of VT storm; given 60 meq for K of 3.1 adding an additional 40 mEq, discuss with pharmacy ok for 10 meq IV runs  OSA on CPAP - continue CPAP  HLD - will hold statin today may restart prior to discharge  Gout - continue allopurinol, Optivol elevated will diurese when electrolytes are ok  Full Code Diet: Cardiac, not planned for ischemic eval at this time; pending echo may re-discussed DVT- heparin; this will start 01/17/22: he noted he took his Xarelto today.  Risk Assessment/Risk Scores:       New York Heart Association (NYHA) Functional Class NYHA Class I  Severity of Illness: The appropriate patient status for this patient is INPATIENT. Inpatient status is judged to be reasonable and necessary in order to provide the required intensity of service to  ensure  the patient's safety. The patient's presenting symptoms, physical exam findings, and initial radiographic and laboratory data in the context of their chronic comorbidities is felt to place them at high risk for further clinical deterioration. Furthermore, it is not anticipated that the patient will be medically stable for discharge from the hospital within 2 midnights of admission.   * I certify that at the point of admission it is my clinical judgment that the patient will require inpatient hospital care spanning beyond 2 midnights from the point of admission due to high intensity of service, high risk for further deterioration and high frequency of surveillance required.*   For questions or updates, please contact [facility] Please consult [email or web address] for contact info under   CRITICAL CARE Performed by: Angellynn Kimberlin A Khloe Hunkele  Total critical care time: 60 minutes. Critical care time was exclusive of separately billable procedures and treating other patients. Critical care was necessary to treat or prevent imminent or life-threatening deterioration. Critical care was time spent personally by me on the following activities: development of treatment plan with patient and/or surrogate as well as nursing, discussions with consultants, evaluation of patient's response to treatment, examination of patient, obtaining history from patient or surrogate, ordering and performing treatments and interventions, ordering and review of laboratory studies, ordering and review of radiographic studies, pulse oximetry and re-evaluation of patient's condition.    Riley Lam, MD FASE Cardiologist [facility]  [address] [place], [address] [place], [place] [address] [phone]  2:10 PM

## 2022-01-16 NOTE — Progress Notes (Signed)
  Amiodarone Drug - Drug Interaction Consult Note  Recommendations: No other active hospital medications ordered at this time and med rec has not been completed at this time. Checked interactions with fill history. Additional notable interactions from home medications is colchicine which can be increased by amiodarone.  Continue with amiodarone as ordered Monitor for effect and interactions listed below Monitor for new orders   Amiodarone is metabolized by the cytochrome P450 system and therefore has the potential to cause many drug interactions. Amiodarone has an average plasma half-life of 50 days (range 20 to 100 days).   There is potential for drug interactions to occur several weeks or months after stopping treatment and the onset of drug interactions may be slow after initiating amiodarone.   [x]  Statins: Increased risk of myopathy. Simvastatin- restrict dose to 20mg  daily. Other statins: counsel patients to report any muscle pain or weakness immediately.  Crestpr  [x]  Anticoagulants: Amiodarone can increase anticoagulant effect. Consider warfarin dose reduction. Patients should be monitored closely and the dose of anticoagulant altered accordingly, remembering that amiodarone levels take several weeks to stabilize.              Xarelto  []  Antiepileptics: Amiodarone can increase plasma concentration of phenytoin, the dose should be reduced. Note that small changes in phenytoin dose can result in large changes in levels. Monitor patient and counsel on signs of toxicity.  [x]  Beta blockers: increased risk of bradycardia, AV block and myocardial depression. Sotalol - avoid concomitant use.             Metoprolol  []   Calcium channel blockers (diltiazem and verapamil): increased risk of bradycardia, AV block and myocardial depression.  []   Cyclosporine: Amiodarone increases levels of cyclosporine. Reduced dose of cyclosporine is recommended.  []  Digoxin dose should be halved when  amiodarone is started.  [x]  Diuretics: increased risk of cardiotoxicity if hypokalemia occurs.  []  Oral hypoglycemic agents (glyburide, glipizide, glimepiride): increased risk of hypoglycemia. Patient's glucose levels should be monitored closely when initiating amiodarone therapy.   []  Drugs that prolong the QT interval:  Torsades de pointes risk may be increased with concurrent use - avoid if possible.  Monitor QTc, also keep magnesium/potassium WNL if concurrent therapy can't be avoided.  Antibiotics: e.g. fluoroquinolones, erythromycin.  Antiarrhythmics: e.g. quinidine, procainamide, disopyramide, sotalol.  Antipsychotics: e.g. phenothiazines, haloperidol.   Lithium, tricyclic antidepressants, and methadone. Thank You,   01/16/2022 1:08 PM

## 2022-01-17 ENCOUNTER — Inpatient Hospital Stay (HOSPITAL_COMMUNITY): Payer: Managed Care, Other (non HMO)

## 2022-01-17 ENCOUNTER — Telehealth (HOSPITAL_COMMUNITY): Payer: Self-pay | Admitting: Pharmacy Technician

## 2022-01-17 ENCOUNTER — Other Ambulatory Visit (HOSPITAL_COMMUNITY): Payer: Self-pay

## 2022-01-17 DIAGNOSIS — I472 Ventricular tachycardia, unspecified: Secondary | ICD-10-CM | POA: Diagnosis not present

## 2022-01-17 DIAGNOSIS — I4729 Other ventricular tachycardia: Secondary | ICD-10-CM

## 2022-01-17 DIAGNOSIS — Z86718 Personal history of other venous thrombosis and embolism: Secondary | ICD-10-CM | POA: Diagnosis not present

## 2022-01-17 DIAGNOSIS — I5022 Chronic systolic (congestive) heart failure: Secondary | ICD-10-CM

## 2022-01-17 DIAGNOSIS — N183 Chronic kidney disease, stage 3 unspecified: Secondary | ICD-10-CM | POA: Diagnosis not present

## 2022-01-17 DIAGNOSIS — I5020 Unspecified systolic (congestive) heart failure: Secondary | ICD-10-CM

## 2022-01-17 LAB — ECHOCARDIOGRAM COMPLETE
Area-P 1/2: 3.81 cm2
Height: 74 in
S' Lateral: 5.6 cm
Weight: 7019.45 oz

## 2022-01-17 LAB — BASIC METABOLIC PANEL
Anion gap: 10 (ref 5–15)
BUN: 15 mg/dL (ref 6–20)
CO2: 24 mmol/L (ref 22–32)
Calcium: 8.4 mg/dL — ABNORMAL LOW (ref 8.9–10.3)
Chloride: 105 mmol/L (ref 98–111)
Creatinine, Ser: 1.46 mg/dL — ABNORMAL HIGH (ref 0.61–1.24)
GFR, Estimated: 57 mL/min — ABNORMAL LOW (ref >60)
Glucose, Bld: 107 mg/dL — ABNORMAL HIGH (ref 70–99)
Potassium: 3.4 mmol/L — ABNORMAL LOW (ref 3.5–5.1)
Sodium: 139 mmol/L (ref 135–145)

## 2022-01-17 LAB — GLUCOSE, CAPILLARY
Glucose-Capillary: 129 mg/dL — ABNORMAL HIGH (ref 70–99)
Glucose-Capillary: 91 mg/dL (ref 70–99)
Glucose-Capillary: 86 mg/dL (ref 70–99)

## 2022-01-17 LAB — MAGNESIUM: Magnesium: 2.2 mg/dL (ref 1.7–2.4)

## 2022-01-17 LAB — HEPARIN LEVEL (UNFRACTIONATED): Heparin Unfractionated: 0.38 IU/mL (ref 0.30–0.70)

## 2022-01-17 MED ORDER — CHLORHEXIDINE GLUCONATE CLOTH 2 % EX PADS
6.0000 | MEDICATED_PAD | Freq: Every day | CUTANEOUS | Status: DC
  Administered 2022-01-17 – 2022-01-19 (×2): 6 via TOPICAL

## 2022-01-17 MED ORDER — PERFLUTREN LIPID MICROSPHERE
1.0000 mL | INTRAVENOUS | Status: AC | PRN
  Administered 2022-01-17: 4 mL via INTRAVENOUS

## 2022-01-17 MED ORDER — RIVAROXABAN 20 MG PO TABS
20.0000 mg | ORAL_TABLET | Freq: Every day | ORAL | Status: DC
  Administered 2022-01-17 – 2022-01-18 (×2): 20 mg via ORAL
  Filled 2022-01-17 (×2): qty 1

## 2022-01-17 MED ORDER — POTASSIUM CHLORIDE CRYS ER 20 MEQ PO TBCR
40.0000 meq | EXTENDED_RELEASE_TABLET | Freq: Four times a day (QID) | ORAL | Status: AC
  Administered 2022-01-17 (×2): 40 meq via ORAL
  Filled 2022-01-17 (×2): qty 2

## 2022-01-17 NOTE — TOC Benefit Eligibility Note (Signed)
Patient Product/process development scientist completed.    The patient is currently admitted and upon discharge could be taking Xarelto 20 mg.  The current 30 day co-pay is $30.00.   The patient is currently admitted and upon discharge could be taking Eliquis 5 mg.  Requires Prior Authorization  The patient is insured through [employer]    Roland Earl, CPhT Pharmacy Patient Advocate Specialist [facility] Patient Advocate Team Direct Number: [phone]  Fax: [phone]

## 2022-01-17 NOTE — Progress Notes (Signed)
Pt will self admin home cpap when ready for bed. RT will cont to monitor as needed.

## 2022-01-17 NOTE — Progress Notes (Signed)
ANTICOAGULATION CONSULT NOTE   Pharmacy Consult for Heparin Indication: atrial fibrillation  No Known Allergies  Patient Measurements: Height: 6\' 2"  (188 cm) Weight: (!) 199 kg (438 lb 11.5 oz) IBW/kg (Calculated) : 82.2 Heparin Dosing Weight: 132.9 kg  Vital Signs: Temp: 98.1 F (36.7 C) (08/14 0755) Temp Source: Oral (08/14 0755) BP: 101/65 (08/14 0600) Pulse Rate: 67 (08/14 0600)  Labs: Recent Labs    01/16/22 1121 01/16/22 1320 01/16/22 1523 01/16/22 2110 01/17/22 0559  HGB 15.5  --   --   --   --   HCT 44.7  --   --   --   --   PLT 189  --   --   --   --   APTT 29  --   --   --   --   LABPROT 13.9  --   --   --   --   INR 1.1  --   --   --   --   HEPARINUNFRC  --   --  <0.10* 0.31 0.38  CREATININE 1.52*  --   --   --  1.46*  TROPONINIHS 12 34*  --   --   --      Estimated Creatinine Clearance: 105.5 mL/min (A) (by C-G formula based on SCr of 1.46 mg/dL (H)).   Medical History: Past Medical History:  Diagnosis Date   CHF (congestive heart failure) (HCC)    Dilated cardiomyopathy (HCC)    Pulmonary hypertension (HCC)     Medications:    Scheduled:   Infusions:   amiodarone 30 mg/hr (01/17/22 0600)   heparin 2,000 Units/hr (01/17/22 0600)   PRN:   Assessment: 56 yom with a history of nonischemic cardiomyopathy (?post viral) with an EF of 35% status post ICD (2019), DVT, AAA, CKD, OSA, obesity s/p gastric sleeve. Patient is presenting with multiple VT shocks. Heparin per pharmacy consult placed for atrial fibrillation.  There is some confusion about anticoagulation status. However, patient informs myself and med history technician that he is no longer on xarelto. Per patient "I was on for a blood clot, and my regular doctor said that I could stop when asked for last refill".  Heparin level at goal again this morning. No overt bleeding or complications noted. Will order cbc.   Goal of Therapy:  Heparin level 0.3-0.7 units/ml Monitor platelets by  anticoagulation protocol: Yes   Plan:  Continue heparin at 2000 units/hr. Daily heparin level  Continue to monitor H&H and platelets  12-12-1998 PharmD., BCPS Clinical Pharmacist 01/17/2022 8:33 AM  [facility] pharmacy phone numbers are listed on [email or web address]

## 2022-01-17 NOTE — Consult Note (Addendum)
Cardiology Consultation:   Patient ID: Mitchell Jennings. MRN: [id number]; DOB: 1965-06-30  Admit date: 01/16/2022 Date of Consult: 01/17/2022  PCP:  [name], MD   [facility] Providers Cardiologist: Dr. Shirlee Latch EP: Dr. Elberta Fortis   Patient Profile:   Mitchell Jennings. is a 56 y.o. 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 Izora Ribas.  Device information MDT dual chamber ICD, implanted 01/17/18  History of Present Illness:   Mr. Plagge last saw the EP service back in 2019 at his 30 day post implant visit, got lost to f/u in our clinic since then but has seen the HF team.  Most recently a clinic visit 09/10/2021, doing well, able to walk up 2 flights of stairs at work without DOE,mostly ,limited by hip pain. Compliant with CPAP.  Admitted yesterday after getting ICD shocks, was at [facility] felt dizzy  but resolved and sometime afterwards, was shocked by his device, EMS called, and they as well observed his 3rd shock. He did miss his metoprolol dose that AM Found hypokalemic and replaced, started on amiodarone and heparin with his DVT hs and Afib as well noted via his device. Felt to be volume OL somewhat and home meds resumed Amiodarone bolus > gtt started  LABS K+ 3.1 > 3.4 > 3.4 Mag 2.0  BUN/Creat 17/1.52 > 1.46 (baseline about here) HS Trop 12 > 34 WBC 6.7 H/H 15/44 Plts 189 TSH 0.550  CARELINK transmission is reviewed 01/16/22 AS/VS 99.7% Battery and lead measurements are good Presenting is SR 3 treated events #1 VT 250bpm ATP worked  > SR #2 VT 261> HV tx > AFib #1 dual  tachycardia AFlutter/VT > 2601bpm, HV tx > SR  Programmed with one zone VF >200  He has felt well, no CP< palpitations or SOB of late or yesterday He was walking in [employer] sudden  weakness, fell to his knees, did not faint Felt bette went home Once there another weak spell, sat and was shocked, they called 911, then shortly after they arrived apparently was shocked again  He had no CP, no syncope   Past Medical History:  Diagnosis Date   CHF (congestive heart failure) (HCC)    Dilated cardiomyopathy (HCC)    Pulmonary hypertension (HCC)     Past Surgical History:  Procedure Laterality Date   BARIATRIC SURGERY     ICD IMPLANT N/A 01/17/2018   Procedure: ICD IMPLANT;  Surgeon: Regan Lemming, MD;  Location: [facility] INVASIVE CV LAB;  Service: Cardiovascular;  Laterality: N/A;   RIGHT/LEFT HEART CATH AND CORONARY ANGIOGRAPHY N/A 07/31/2017   Procedure: RIGHT/LEFT HEART CATH AND CORONARY ANGIOGRAPHY;  Surgeon: Yvonne Kendall, MD;  Location: [facility] INVASIVE CV LAB;  Service: Cardiovascular;  Laterality: N/A;   UMBILICAL HERNIA REPAIR       Home Medications:  Prior to Admission medications   Medication Sig Start Date End Date Taking? Authorizing Provider  allopurinol (ZYLOPRIM) 300 MG tablet Take 300 mg by mouth daily. 12/27/21  Yes [provider]  dapagliflozin propanediol (FARXIGA) 10 MG TABS tablet Take 1 tablet (10 mg total) by mouth daily before breakfast. 09/10/21  Yes Milford, Anderson Malta, FNP  metoprolol succinate (TOPROL-XL) 100 MG 24 hr tablet Take 1 tablet (100 mg total) by mouth 2 (two) times daily. Take with  or immediately following a meal. 09/10/21  Yes Milford, Maricela Bo, FNP  Multiple Vitamins-Minerals (CENTRUM MEN) TABS Take 1 tablet by mouth daily.   Yes [provider]  potassium chloride SA (KLOR-CON M) 20 MEQ tablet Take 2 tablets (40 mEq total) by mouth in the morning AND 1 tablet (20 mEq total) every evening. 09/10/21  Yes Milford, Maricela Bo, FNP  rosuvastatin (CRESTOR) 5 MG tablet Take 1 tablet (5 mg total) by mouth daily. 09/10/21  Yes Milford, Maricela Bo, FNP  sacubitril-valsartan (ENTRESTO) 49-51 MG Take 1 tablet by mouth 2 (two) times  daily. 09/10/21  Yes Milford, Maricela Bo, FNP  Semaglutide (OZEMPIC, 0.25 OR 0.5 MG/DOSE, [place]) Inject 0.5 mg into the skin once a week.   Yes [provider]  spironolactone (ALDACTONE) 25 MG tablet Take 1 tablet (25 mg total) by mouth daily. 09/10/21  Yes Milford, Maricela Bo, FNP  rivaroxaban (XARELTO) 20 MG TABS tablet Take 1 tablet (20 mg total) by mouth daily with supper. Patient not taking: Reported on 01/16/2022 09/10/21   Rafael Bihari, FNP  torsemide (DEMADEX) 20 MG tablet Take 1 tablet (20 mg total) by mouth daily. Patient not taking: Reported on 01/16/2022 09/10/21   Rafael Bihari, FNP    Inpatient Medications: Scheduled Meds:  allopurinol  300 mg Oral Daily   Chlorhexidine Gluconate Cloth  6 each Topical Daily   dapagliflozin propanediol  10 mg Oral QAC breakfast   metoprolol succinate  100 mg Oral BID   multivitamin with minerals  1 tablet Oral Daily   potassium chloride  20 mEq Oral QPM   potassium chloride  40 mEq Oral Daily   sacubitril-valsartan  1 tablet Oral BID   spironolactone  25 mg Oral Daily   Continuous Infusions:  amiodarone 30 mg/hr (01/17/22 0600)   heparin 2,000 Units/hr (01/17/22 0600)   PRN Meds: acetaminophen, mouth rinse  Allergies:   No Known Allergies  Social History:   Social History   Socioeconomic History   Marital status: Married    Spouse name: Not on file   Number of children: Not on file   Years of education: Not on file   Highest education level: Not on file  Occupational History   Not on file  Tobacco Use   Smoking status: Never   Smokeless tobacco: Never  Vaping Use   Vaping Use: Never used  Substance and Sexual Activity   Alcohol use: No   Drug use: No   Sexual activity: Not on file  Other Topics Concern   Not on file  Social History Narrative   Not on file   Social Determinants of Health   Financial Resource Strain: Not on file  Food Insecurity: Not on file  Transportation Needs: Not on file  Physical  Activity: Not on file  Stress: Not on file  Social Connections: Not on file  Intimate Partner Violence: Not on file    Family History:   Family History  Problem Relation Age of Onset   Hypertension Mother    Lung cancer Mother    Heart failure Mother    Diabetes type II Father    Diabetes type II Brother      ROS:  Please see the history of present illness.  All other ROS reviewed and negative.     Physical Exam/Data:   Vitals:   01/17/22 0400 01/17/22 0500 01/17/22 0600 01/17/22 0755  BP: 94/72 99/65 101/65   Pulse: 70 66 67   Resp: 17 (!)  39 19   Temp:    98.1 F (36.7 C)  TempSrc:    Oral  SpO2: 94% 91% 96%   Weight:  (!) 199 kg    Height:        Intake/Output Summary (Last 24 hours) at 01/17/2022 0810 Last data filed at 01/17/2022 0600 Gross per 24 hour  Intake 1002.33 ml  Output 1450 ml  Net -447.67 ml      01/17/2022    5:00 AM 01/16/2022   11:28 AM 09/10/2021    9:44 AM  Last 3 Weights  Weight (lbs) 438 lb 11.5 oz 448 lb 6.7 oz 448 lb 6.4 oz  Weight (kg) 199 kg 203.4 kg 203.393 kg     Body mass index is 56.33 kg/m.  General:  Well nourished, well developed, in no acute distress HEENT: normal Neck: no JVD Vascular: No carotid bruits; Distal pulses 2+ bilaterally Cardiac:  RRR; no murmurs gallops or rubs Lungs: CTA b/l, no wheezing, rhonchi or rales  Abd: soft, nontender, obese Ext: no edema, chronic skin changes Musculoskeletal:  No deformities Skin: warm and dry  Neuro: no focal abnormalities noted Psych:  Normal affect   EKG:  The EKG was personally reviewed and demonstrates:    ST 106bpm, couplet, LBBB SR 70bpm, LBB, LAD  OLD 09/10/21: SR, LBBB, LAD, PVCs, couplet   Telemetry:  Telemetry was personally reviewed and demonstrates:   SR 60's-70;s, initially frequent PVCs/couplets though burden is much better currently   Relevant CV Studies:   04/08/2020: TTE  1. Limited images. Suggest Definity contrast study for better assessment  of LV  function (cardiac MRI would be another consideration if ICD is  compatible).   2. Left ventricular ejection fraction, by estimation, is approximately  35% based on very limited views. The left ventricle has moderately  decreased function. Left ventricular endocardial border not optimally  defined to evaluate regional wall motion. The   left ventricular internal cavity size was moderately to severely dilated.  Left ventricular diastolic parameters are indeterminate.   3. Normal RV-RA gradient of 17 mmHg. Right ventricular systolic function  is normal. The right ventricular size is normal. A device wire is  visualized.   4. The mitral valve is grossly normal. Trivial mitral valve  regurgitation.   5. The aortic valve is tricuspid. Aortic valve regurgitation is not  visualized.   6. Unable to estimate CVP.    12/18/17: TTE Study Conclusions - Left ventricle: The cavity size was mildly dilated. Wall   thickness was normal. The estimated ejection fraction was 30%.   Diffuse hypokinesis. Features are consistent with a pseudonormal   left ventricular filling pattern, with concomitant abnormal   relaxation and increased filling pressure (grade 2 diastolic   dysfunction). - Aortic valve: There was no stenosis. - Aorta: Mildly dilated aortic root. Aortic root dimension: 41 mm   (ED). - Mitral valve: There was mild regurgitation. - Left atrium: The atrium was mildly dilated. - Right ventricle: The cavity size was normal. Systolic function   was normal. - Pulmonary arteries: No complete TR doppler jet so unable to   estimate PA systolic pressure. - Systemic veins: IVC not visualized. Impressions: - Mildly dilated LV with EF 30%, diffuse hypokinesis. Moderate   diastolic dysfunction. Normal RV size and systolic function. Mild   MR.     07/31/17: R/LHC Conclusions: No angiographically significant coronary artery disease, consistent with non-ischemic cardiomyopathy. Moderately elevated  right heart filling pressures. Moderately to severely elevated left heart  filling pressures. Severe pulmonary hypertension. Low Fick cardiac output/index.   Laboratory Data:  High Sensitivity Troponin:   Recent Labs  Lab 01/16/22 1121 01/16/22 1320  TROPONINIHS 12 34*     Chemistry Recent Labs  Lab 01/16/22 1121 01/16/22 2110 01/17/22 0559  NA 140  --  139  K 3.1* 3.4* 3.4*  CL 108  --  105  CO2 22  --  24  GLUCOSE 142*  --  107*  BUN 17  --  15  CREATININE 1.52*  --  1.46*  CALCIUM 8.7*  --  8.4*  MG 2.0  --   --   GFRNONAA 54*  --  57*  ANIONGAP 10  --  10    No results for input(s): "PROT", "ALBUMIN", "AST", "ALT", "ALKPHOS", "BILITOT" in the last 168 hours. Lipids No results for input(s): "CHOL", "TRIG", "HDL", "LABVLDL", "LDLCALC", "CHOLHDL" in the last 168 hours.  Hematology Recent Labs  Lab 01/16/22 1121  WBC 6.7  RBC 4.67  HGB 15.5  HCT 44.7  MCV 95.7  MCH 33.2  MCHC 34.7  RDW 12.6  PLT 189   Thyroid  Recent Labs  Lab 01/16/22 1320  TSH 0.550    BNPNo results for input(s): "BNP", "PROBNP" in the last 168 hours.  DDimer No results for input(s): "DDIMER" in the last 168 hours.   Radiology/Studies:  DG Chest Portable 1 View  Result Date: 01/16/2022 CLINICAL DATA:  Pacemaker malfunction. EXAM: PORTABLE CHEST 1 VIEW COMPARISON:  Two-view chest x-ray 01/18/2018 FINDINGS: Heart is enlarged. No edema or effusion is present. Lung volumes are low. No focal airspace disease present. Pacemaker control unit and pacing wires are stable. IMPRESSION: 1. Stable cardiomegaly without failure. 2. No acute cardiopulmonary disease. Electronically Signed   By: San Morelle M.D.   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  Quentin Ore Hypokalemia Missed metoprolol dose (Toprol 100mg  BID home)  Home torsemide, will give some more extra K+ Continue amio for now  We discussed no driving 6 months  AFib/flutter This looks new, already was Rx  [place] at home for DVTs  He tells [place] that he had stopped the Xarelto in d/w his PMD a while back after a negative [place] Will check his legs today He confirms with me 2 separate DVT events one after leg trauma, one unprovoked I think he has indication for long term [place] We can monitor his AF burden via device, this appears to be the initial event   NICM Chronic CHF optiVol looks good HF team is on board   ADDEND: Dr. Quentin Ore has seen the patient With NICM, no obstructive CAD in 2019 and no symptoms, do not feel strongly that he needs a cath Continue amiodarone gtt for today OK for tele bed from [facility]   Risk Assessment/Risk Scores:    For questions or updates, please contact [place] Please consult [email or web address] for contact info under    Signed, Baldwin [place], PA-C  01/17/2022 8:10 AM ;
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
## 2022-01-17 NOTE — Progress Notes (Signed)
Bilateral LE venous duplex study completed. Please see CV Proc for preliminary results.  Teigen Parslow BS, RVT 01/17/2022 2:11 PM

## 2022-01-17 NOTE — Telephone Encounter (Signed)
Pharmacy Patient Advocate Encounter  Insurance verification completed.    The patient is insured through [employer]   The patient is currently admitted and ran test claims for the following: Eliquis, Xarelto.  Copays and coinsurance results were relayed to Inpatient clinical team.      

## 2022-01-18 DIAGNOSIS — I472 Ventricular tachycardia, unspecified: Secondary | ICD-10-CM | POA: Diagnosis not present

## 2022-01-18 DIAGNOSIS — I5020 Unspecified systolic (congestive) heart failure: Secondary | ICD-10-CM | POA: Diagnosis not present

## 2022-01-18 LAB — CBC
HCT: 43.8 % (ref 39.0–52.0)
Hemoglobin: 14.9 g/dL (ref 13.0–17.0)
MCH: 32.8 pg (ref 26.0–34.0)
MCHC: 34 g/dL (ref 30.0–36.0)
MCV: 96.5 fL (ref 80.0–100.0)
Platelets: 155 10*3/uL (ref 150–400)
RBC: 4.54 MIL/uL (ref 4.22–5.81)
RDW: 12.8 % (ref 11.5–15.5)
WBC: 6.4 10*3/uL (ref 4.0–10.5)
nRBC: 0 % (ref 0.0–0.2)

## 2022-01-18 LAB — HEMOGLOBIN A1C
Hgb A1c MFr Bld: 5.8 % — ABNORMAL HIGH (ref 4.8–5.6)
Mean Plasma Glucose: 119.76 mg/dL

## 2022-01-18 LAB — MAGNESIUM: Magnesium: 2.1 mg/dL (ref 1.7–2.4)

## 2022-01-18 LAB — BASIC METABOLIC PANEL
Anion gap: 6 (ref 5–15)
BUN: 15 mg/dL (ref 6–20)
CO2: 27 mmol/L (ref 22–32)
Calcium: 8.7 mg/dL — ABNORMAL LOW (ref 8.9–10.3)
Chloride: 109 mmol/L (ref 98–111)
Creatinine, Ser: 1.48 mg/dL — ABNORMAL HIGH (ref 0.61–1.24)
GFR, Estimated: 56 mL/min — ABNORMAL LOW (ref >60)
Glucose, Bld: 96 mg/dL (ref 70–99)
Potassium: 3.7 mmol/L (ref 3.5–5.1)
Sodium: 142 mmol/L (ref 135–145)

## 2022-01-18 MED ORDER — SPIRONOLACTONE 25 MG PO TABS
50.0000 mg | ORAL_TABLET | Freq: Every day | ORAL | Status: DC
  Administered 2022-01-19: 50 mg via ORAL
  Filled 2022-01-18: qty 2

## 2022-01-18 MED ORDER — POTASSIUM CHLORIDE CRYS ER 20 MEQ PO TBCR: 40.0000 meq | EXTENDED_RELEASE_TABLET | Freq: Once | ORAL | Status: DC

## 2022-01-18 MED ORDER — POTASSIUM CHLORIDE CRYS ER 20 MEQ PO TBCR
60.0000 meq | EXTENDED_RELEASE_TABLET | Freq: Every day | ORAL | Status: DC
  Administered 2022-01-18: 60 meq via ORAL
  Filled 2022-01-18: qty 3

## 2022-01-18 MED ORDER — POTASSIUM CHLORIDE CRYS ER 20 MEQ PO TBCR: 40.0000 meq | EXTENDED_RELEASE_TABLET | Freq: Every evening | ORAL | Status: DC

## 2022-01-18 MED ORDER — AMIODARONE HCL 200 MG PO TABS
400.0000 mg | ORAL_TABLET | Freq: Two times a day (BID) | ORAL | Status: DC
  Administered 2022-01-18 – 2022-01-19 (×3): 400 mg via ORAL
  Filled 2022-01-18 (×3): qty 2

## 2022-01-18 MED ORDER — SPIRONOLACTONE 25 MG PO TABS
25.0000 mg | ORAL_TABLET | Freq: Once | ORAL | Status: AC
  Administered 2022-01-18: 25 mg via ORAL
  Filled 2022-01-18: qty 1

## 2022-01-18 MED ORDER — POTASSIUM CHLORIDE CRYS ER 20 MEQ PO TBCR
40.0000 meq | EXTENDED_RELEASE_TABLET | Freq: Two times a day (BID) | ORAL | Status: DC
  Administered 2022-01-18 – 2022-01-19 (×2): 40 meq via ORAL
  Filled 2022-01-18 (×2): qty 2

## 2022-01-18 MED ORDER — TORSEMIDE 20 MG PO TABS
20.0000 mg | ORAL_TABLET | Freq: Every day | ORAL | Status: DC
  Administered 2022-01-18 – 2022-01-19 (×2): 20 mg via ORAL
  Filled 2022-01-18 (×2): qty 1

## 2022-01-18 NOTE — Progress Notes (Signed)
Received pt from 2H VSs, pleasant and alert,  wife at bedside. Pt in Sizewise bed,  no acute distress. SRP, RN
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
## 2022-01-18 NOTE — Progress Notes (Signed)
Mobility Specialist Progress Note:   01/18/22 1208  Mobility  Activity Ambulated with assistance in hallway  Level of Assistance Independent  Assistive Device None  Distance Ambulated (ft) 1000 ft  Activity Response Tolerated well  $Mobility charge 1 Mobility   Pt received in chair willing to participate in mobility. No complaints of pain. Left in chair with call bell in reach and all needs met.  [facility] Cristi Gwynn Mobility Specialist
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
## 2022-01-18 NOTE — Progress Notes (Signed)
Pt has home CPAP machine, no assistance needed.  

## 2022-01-18 NOTE — Plan of Care (Signed)

## 2022-01-18 NOTE — Plan of Care (Signed)
  Problem: Education: Goal: Knowledge of General Education information will improve Description: Including pain rating scale, medication(s)/side effects and non-pharmacologic comfort measures 01/18/2022 0151 by Charmian Muff, RN Outcome: Progressing 01/18/2022 0013 by Charmian Muff, RN Outcome: Progressing   Problem: Health Behavior/Discharge Planning: Goal: Ability to manage health-related needs will improve 01/18/2022 0151 by Charmian Muff, RN Outcome: Progressing 01/18/2022 0013 by Charmian Muff, RN Outcome: Progressing   Problem: Clinical Measurements: Goal: Ability to maintain clinical measurements within normal limits will improve 01/18/2022 0151 by Charmian Muff, RN Outcome: Progressing 01/18/2022 0013 by Charmian Muff, RN Outcome: Progressing Goal: Will remain free from infection Outcome: Progressing Goal: Diagnostic test results will improve Outcome: Progressing

## 2022-01-19 ENCOUNTER — Other Ambulatory Visit (HOSPITAL_COMMUNITY): Payer: Self-pay

## 2022-01-19 DIAGNOSIS — I5020 Unspecified systolic (congestive) heart failure: Secondary | ICD-10-CM | POA: Diagnosis not present

## 2022-01-19 DIAGNOSIS — I472 Ventricular tachycardia, unspecified: Secondary | ICD-10-CM | POA: Diagnosis not present

## 2022-01-19 LAB — BASIC METABOLIC PANEL
Anion gap: 8 (ref 5–15)
BUN: 13 mg/dL (ref 6–20)
CO2: 27 mmol/L (ref 22–32)
Calcium: 9.3 mg/dL (ref 8.9–10.3)
Chloride: 107 mmol/L (ref 98–111)
Creatinine, Ser: 1.54 mg/dL — ABNORMAL HIGH (ref 0.61–1.24)
GFR, Estimated: 53 mL/min — ABNORMAL LOW (ref >60)
Glucose, Bld: 98 mg/dL (ref 70–99)
Potassium: 4 mmol/L (ref 3.5–5.1)
Sodium: 142 mmol/L (ref 135–145)

## 2022-01-19 LAB — MAGNESIUM: Magnesium: 2.4 mg/dL (ref 1.7–2.4)

## 2022-01-19 MED ORDER — POTASSIUM CHLORIDE CRYS ER 20 MEQ PO TBCR
40.0000 meq | EXTENDED_RELEASE_TABLET | Freq: Two times a day (BID) | ORAL | 11 refills | Status: DC
  Filled 2022-01-19: qty 120, 30d supply, fill #0

## 2022-01-19 MED ORDER — TORSEMIDE 20 MG PO TABS
20.0000 mg | ORAL_TABLET | Freq: Every day | ORAL | 11 refills | Status: DC
  Filled 2022-01-19: qty 30, 30d supply, fill #0

## 2022-01-19 MED ORDER — SPIRONOLACTONE 50 MG PO TABS
50.0000 mg | ORAL_TABLET | Freq: Every day | ORAL | 11 refills | Status: DC
  Filled 2022-01-19: qty 30, 30d supply, fill #0

## 2022-01-19 MED ORDER — AMIODARONE HCL 200 MG PO TABS
ORAL_TABLET | ORAL | 1 refills | Status: DC
  Filled 2022-01-19: qty 83, 30d supply, fill #0

## 2022-01-19 MED ORDER — RIVAROXABAN 20 MG PO TABS
20.0000 mg | ORAL_TABLET | Freq: Every day | ORAL | 11 refills | Status: DC
  Filled 2022-01-19: qty 30, 30d supply, fill #0

## 2022-01-19 NOTE — Plan of Care (Signed)
  Problem: Education: Goal: Knowledge of General Education information will improve Description: Including pain rating scale, medication(s)/side effects and non-pharmacologic comfort measures 01/19/2022 0128 by Charmian Muff, RN Outcome: Progressing 01/18/2022 2312 by Charmian Muff, RN Outcome: Progressing   Problem: Health Behavior/Discharge Planning: Goal: Ability to manage health-related needs will improve 01/19/2022 0128 by Charmian Muff, RN Outcome: Progressing 01/18/2022 2312 by Charmian Muff, RN Outcome: Progressing   Problem: Clinical Measurements: Goal: Ability to maintain clinical measurements within normal limits will improve 01/19/2022 0128 by Charmian Muff, RN Outcome: Progressing 01/18/2022 2312 by Charmian Muff, RN Outcome: Progressing Goal: Will remain free from infection 01/19/2022 0128 by Charmian Muff, RN Outcome: Progressing 01/18/2022 2312 by Charmian Muff, RN Outcome: Progressing Goal: Diagnostic test results will improve Outcome: Progressing

## 2022-01-19 NOTE — Plan of Care (Signed)
  Problem: Education: Goal: Knowledge of General Education information will improve Description: Including pain rating scale, medication(s)/side effects and non-pharmacologic comfort measures 01/19/2022 0129 by Charmian Muff, RN Outcome: Progressing 01/19/2022 0128 by Charmian Muff, RN Outcome: Progressing 01/18/2022 2312 by Charmian Muff, RN Outcome: Progressing   Problem: Health Behavior/Discharge Planning: Goal: Ability to manage health-related needs will improve 01/19/2022 0129 by Charmian Muff, RN Outcome: Progressing 01/19/2022 0128 by Charmian Muff, RN Outcome: Progressing 01/18/2022 2312 by Charmian Muff, RN Outcome: Progressing   Problem: Clinical Measurements: Goal: Ability to maintain clinical measurements within normal limits will improve 01/19/2022 0129 by Charmian Muff, RN Outcome: Progressing 01/19/2022 0128 by Charmian Muff, RN Outcome: Progressing 01/18/2022 2312 by Charmian Muff, RN Outcome: Progressing Goal: Will remain free from infection 01/19/2022 0129 by Charmian Muff, RN Outcome: Progressing 01/19/2022 0128 by Charmian Muff, RN Outcome: Progressing 01/18/2022 2312 by Charmian Muff, RN Outcome: Progressing Goal: Diagnostic test results will improve 01/19/2022 0129 by Charmian Muff, RN Outcome: Progressing 01/18/2022 2312 by Charmian Muff, RN Outcome: Progressing
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
## 2022-01-19 NOTE — Discharge Instructions (Signed)
NO DRIVING 6 MONTHS 

## 2022-01-19 NOTE — TOC Transition Note (Signed)
Transition of Care [facility]) - CM/SW Discharge Note   Patient Details  Name: Mitchell Jennings. MRN: [id number] Date of Birth: 1965-07-03  [facility] of [facility]) CM/SW Contact:  Leone Haven, RN Phone Number: 01/19/2022, 9:57 AM   Clinical Narrative:    Patient is for dc today, TOC to do meds.  His family member is here to transport him home. He has no other needs.    Final next level of care: Home/Self Care Barriers to Discharge: No Barriers Identified   Patient Goals and CMS Choice Patient states their goals for this hospitalization and ongoing recovery are:: return home   Choice offered to / list presented to : NA  Discharge Placement                       Discharge Plan and Services                  DME Agency: NA                  Social Determinants of Health (SDOH) Interventions     Readmission Risk Interventions     No data to display
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
## 2022-01-19 NOTE — Progress Notes (Signed)
Mobility Specialist - Progress Note   01/19/22 1000  Mobility  Activity Ambulated independently in hallway  Level of Assistance Independent  Assistive Device None  Distance Ambulated (ft) 500 ft  Activity Response Tolerated well  $Mobility charge 1 Mobility   Pt received in chair, agreed to mobilize. Pt had no pain or dizziness. Left Pt in room with family.   Marilynne Halsted Mobility Specialist

## 2022-01-20 ENCOUNTER — Other Ambulatory Visit (HOSPITAL_COMMUNITY): Payer: Self-pay

## 2022-01-25 ENCOUNTER — Other Ambulatory Visit: Payer: Self-pay

## 2022-01-25 ENCOUNTER — Emergency Department (HOSPITAL_COMMUNITY): Payer: Managed Care, Other (non HMO)

## 2022-01-25 ENCOUNTER — Encounter (HOSPITAL_COMMUNITY): Payer: Self-pay

## 2022-01-25 ENCOUNTER — Observation Stay (HOSPITAL_COMMUNITY)
Admission: EM | Admit: 2022-01-25 | Discharge: 2022-01-27 | Disposition: A | Discharge status: Home / Self Care | Payer: Managed Care, Other (non HMO) | Attending: Internal Medicine | Admitting: Internal Medicine

## 2022-01-25 ENCOUNTER — Telehealth (HOSPITAL_COMMUNITY): Payer: Self-pay | Admitting: *Deleted

## 2022-01-25 DIAGNOSIS — Z20822 Contact with and (suspected) exposure to covid-19: Secondary | ICD-10-CM | POA: Diagnosis not present

## 2022-01-25 DIAGNOSIS — I5042 Chronic combined systolic (congestive) and diastolic (congestive) heart failure: Secondary | ICD-10-CM | POA: Diagnosis not present

## 2022-01-25 DIAGNOSIS — Z7984 Long term (current) use of oral hypoglycemic drugs: Secondary | ICD-10-CM | POA: Insufficient documentation

## 2022-01-25 DIAGNOSIS — I5022 Chronic systolic (congestive) heart failure: Secondary | ICD-10-CM

## 2022-01-25 DIAGNOSIS — Z7985 Long-term (current) use of injectable non-insulin antidiabetic drugs: Secondary | ICD-10-CM | POA: Diagnosis not present

## 2022-01-25 DIAGNOSIS — Z7901 Long term (current) use of anticoagulants: Secondary | ICD-10-CM | POA: Diagnosis not present

## 2022-01-25 DIAGNOSIS — Z95810 Presence of automatic (implantable) cardiac defibrillator: Secondary | ICD-10-CM | POA: Insufficient documentation

## 2022-01-25 DIAGNOSIS — Z6841 Body Mass Index (BMI) 40.0 and over, adult: Secondary | ICD-10-CM

## 2022-01-25 DIAGNOSIS — Z79899 Other long term (current) drug therapy: Secondary | ICD-10-CM | POA: Insufficient documentation

## 2022-01-25 DIAGNOSIS — E860 Dehydration: Secondary | ICD-10-CM | POA: Diagnosis not present

## 2022-01-25 DIAGNOSIS — I951 Orthostatic hypotension: Secondary | ICD-10-CM | POA: Insufficient documentation

## 2022-01-25 DIAGNOSIS — Z86718 Personal history of other venous thrombosis and embolism: Secondary | ICD-10-CM | POA: Insufficient documentation

## 2022-01-25 DIAGNOSIS — N1832 Chronic kidney disease, stage 3b: Secondary | ICD-10-CM | POA: Insufficient documentation

## 2022-01-25 DIAGNOSIS — N179 Acute kidney failure, unspecified: Secondary | ICD-10-CM | POA: Diagnosis not present

## 2022-01-25 DIAGNOSIS — Z4502 Encounter for adjustment and management of automatic implantable cardiac defibrillator: Secondary | ICD-10-CM

## 2022-01-25 DIAGNOSIS — I480 Paroxysmal atrial fibrillation: Secondary | ICD-10-CM

## 2022-01-25 DIAGNOSIS — R55 Syncope and collapse: Secondary | ICD-10-CM | POA: Insufficient documentation

## 2022-01-25 DIAGNOSIS — N183 Chronic kidney disease, stage 3 unspecified: Secondary | ICD-10-CM | POA: Diagnosis not present

## 2022-01-25 DIAGNOSIS — I959 Hypotension, unspecified: Secondary | ICD-10-CM | POA: Diagnosis present

## 2022-01-25 DIAGNOSIS — R531 Weakness: Secondary | ICD-10-CM | POA: Diagnosis present

## 2022-01-25 DIAGNOSIS — I130 Hypertensive heart and chronic kidney disease with heart failure and stage 1 through stage 4 chronic kidney disease, or unspecified chronic kidney disease: Secondary | ICD-10-CM | POA: Insufficient documentation

## 2022-01-25 LAB — COMPREHENSIVE METABOLIC PANEL
ALT: 31 U/L (ref 0–44)
AST: 26 U/L (ref 15–41)
Albumin: 4.6 g/dL (ref 3.5–5.0)
Alkaline Phosphatase: 88 U/L (ref 38–126)
Anion gap: 12 (ref 5–15)
BUN: 24 mg/dL — ABNORMAL HIGH (ref 6–20)
CO2: 24 mmol/L (ref 22–32)
Calcium: 9.5 mg/dL (ref 8.9–10.3)
Chloride: 102 mmol/L (ref 98–111)
Creatinine, Ser: 2.39 mg/dL — ABNORMAL HIGH (ref 0.61–1.24)
GFR, Estimated: 31 mL/min — ABNORMAL LOW (ref >60)
Glucose, Bld: 100 mg/dL — ABNORMAL HIGH (ref 70–99)
Potassium: 4.3 mmol/L (ref 3.5–5.1)
Sodium: 138 mmol/L (ref 135–145)
Total Bilirubin: 1 mg/dL (ref 0.3–1.2)
Total Protein: 8.6 g/dL — ABNORMAL HIGH (ref 6.5–8.1)

## 2022-01-25 LAB — CBC WITH DIFFERENTIAL/PLATELET
Abs Immature Granulocytes: 0.04 10*3/uL (ref 0.00–0.07)
Basophils Absolute: 0.2 10*3/uL — ABNORMAL HIGH (ref 0.0–0.1)
Basophils Relative: 2 %
Eosinophils Absolute: 0.3 10*3/uL (ref 0.0–0.5)
Eosinophils Relative: 3 %
HCT: 55.9 % — ABNORMAL HIGH (ref 39.0–52.0)
Hemoglobin: 18.9 g/dL — ABNORMAL HIGH (ref 13.0–17.0)
Immature Granulocytes: 0 %
Lymphocytes Relative: 26 %
Lymphs Abs: 2.8 10*3/uL (ref 0.7–4.0)
MCH: 33.2 pg (ref 26.0–34.0)
MCHC: 33.8 g/dL (ref 30.0–36.0)
MCV: 98.1 fL (ref 80.0–100.0)
Monocytes Absolute: 1.3 10*3/uL — ABNORMAL HIGH (ref 0.1–1.0)
Monocytes Relative: 12 %
Neutro Abs: 6 10*3/uL (ref 1.7–7.7)
Neutrophils Relative %: 57 %
Platelets: 252 10*3/uL (ref 150–400)
RBC: 5.7 MIL/uL (ref 4.22–5.81)
RDW: 12.9 % (ref 11.5–15.5)
WBC: 10.6 10*3/uL — ABNORMAL HIGH (ref 4.0–10.5)
nRBC: 0 % (ref 0.0–0.2)

## 2022-01-25 LAB — SARS CORONAVIRUS 2 BY RT PCR: SARS Coronavirus 2 by RT PCR: NEGATIVE

## 2022-01-25 LAB — MAGNESIUM: Magnesium: 2.5 mg/dL — ABNORMAL HIGH (ref 1.7–2.4)

## 2022-01-25 LAB — CK: Total CK: 73 U/L (ref 49–397)

## 2022-01-25 LAB — BRAIN NATRIURETIC PEPTIDE: B Natriuretic Peptide: 11.3 pg/mL (ref 0.0–100.0)

## 2022-01-25 LAB — TROPONIN I (HIGH SENSITIVITY): Troponin I (High Sensitivity): 6 ng/L (ref <18)

## 2022-01-25 MED ORDER — AMIODARONE HCL 200 MG PO TABS
400.0000 mg | ORAL_TABLET | Freq: Two times a day (BID) | ORAL | Status: DC
  Administered 2022-01-25 – 2022-01-27 (×4): 400 mg via ORAL
  Filled 2022-01-25 (×4): qty 2

## 2022-01-25 MED ORDER — LACTATED RINGERS IV BOLUS
500.0000 mL | Freq: Once | INTRAVENOUS | Status: AC
  Administered 2022-01-25: 500 mL via INTRAVENOUS

## 2022-01-25 MED ORDER — ROSUVASTATIN CALCIUM 5 MG PO TABS
5.0000 mg | ORAL_TABLET | Freq: Every day | ORAL | Status: DC
  Administered 2022-01-26 – 2022-01-27 (×2): 5 mg via ORAL
  Filled 2022-01-25 (×2): qty 1

## 2022-01-25 MED ORDER — RIVAROXABAN 20 MG PO TABS
20.0000 mg | ORAL_TABLET | Freq: Every day | ORAL | Status: DC
  Administered 2022-01-25 – 2022-01-26 (×2): 20 mg via ORAL
  Filled 2022-01-25: qty 2
  Filled 2022-01-25: qty 1

## 2022-01-25 MED ORDER — ONDANSETRON HCL 4 MG PO TABS: 4.0000 mg | ORAL_TABLET | Freq: Four times a day (QID) | ORAL | Status: DC | PRN

## 2022-01-25 MED ORDER — ADULT MULTIVITAMIN W/MINERALS CH
1.0000 | ORAL_TABLET | Freq: Every day | ORAL | Status: DC
  Administered 2022-01-26 – 2022-01-27 (×2): 1 via ORAL
  Filled 2022-01-25 (×2): qty 1

## 2022-01-25 MED ORDER — ALLOPURINOL 300 MG PO TABS
300.0000 mg | ORAL_TABLET | Freq: Every day | ORAL | Status: DC
  Administered 2022-01-26 – 2022-01-27 (×2): 300 mg via ORAL
  Filled 2022-01-25: qty 1
  Filled 2022-01-25: qty 3

## 2022-01-25 MED ORDER — ACETAMINOPHEN 650 MG RE SUPP: 650.0000 mg | Freq: Four times a day (QID) | RECTAL | Status: DC | PRN

## 2022-01-25 MED ORDER — ACETAMINOPHEN 325 MG PO TABS: 650.0000 mg | ORAL_TABLET | Freq: Four times a day (QID) | ORAL | Status: DC | PRN

## 2022-01-25 MED ORDER — SEMAGLUTIDE(0.25 OR 0.5MG/DOS) 2 MG/1.5ML ~~LOC~~ SOPN: 0.5000 mg | PEN_INJECTOR | SUBCUTANEOUS | Status: DC

## 2022-01-25 MED ORDER — ONDANSETRON HCL 4 MG/2ML IJ SOLN: 4.0000 mg | Freq: Four times a day (QID) | INTRAMUSCULAR | Status: DC | PRN

## 2022-01-25 NOTE — ED Provider Notes (Signed)
[facility] EMERGENCY DEPARTMENT Provider Note   CSN: [id number] Arrival date & time: 01/25/22  1654     History  Chief Complaint  Patient presents with   Dizziness   Weakness    Mitchell Jennings. is a 56 y.o. male.   Dizziness Associated symptoms: weakness (Generalized)   Weakness Associated symptoms: dizziness   Patient presents for feelings of weak and dizzy.  Medical history includes CHF, nonischemic cardiomyopathy, pacemaker placement, atrial fibrillation, obesity, history of DVT, sleep apnea.  Mitchell Jennings had a recent hospitalization 1 week ago following 3 shocks from his ICD.  Mitchell Jennings was started on amiodarone.  Mitchell Jennings was started on Xarelto.  Echo showed diminished EF (now 25 to 30%).  Mitchell Jennings was discharged on amiodarone and potassium supplementation.  Since his admission, Mitchell Jennings was doing well.  Mitchell Jennings went back to work on Thursday, Friday, and Saturday.  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  Mitchell Jennings has not been exerting himself since his hospitalization.  His wife would not let him.  Yesterday, Mitchell Jennings began to feel fatigue and dizziness.  Mitchell Jennings experienced bilateral muscle cramps overnight.  This was primarily in his calfs.  Mitchell Jennings states that this is not a normal thing for him.  This morning, Mitchell Jennings felt less tired than Mitchell Jennings did yesterday but would still experience symptoms of lightheadedness.  During these episodes of lightheadedness, Mitchell Jennings did not check his blood pressure.  Mitchell Jennings did check his blood pressure yesterday and today while seated and at rest and not symptomatic.  At those times, blood pressure was in the range of 100s over 70s, which is typical for him.  Patient reports that Mitchell Jennings has been adherent to all his medications since hospitalization.  Currently is on 20 mg of torsemide daily.  Mitchell Jennings feels that Mitchell Jennings has been eating and drinking a normal amount other than yesterday.  Mitchell Jennings denies any shortness of breath.  Mitchell Jennings denies any areas of discomfort.  Mitchell Jennings did have left hand paresthesias earlier today  which have resolved.     Home Medications Prior to Admission medications   Medication Sig Start Date End Date Taking? Authorizing Provider  allopurinol (ZYLOPRIM) 300 MG tablet Take 300 mg by mouth daily. 12/27/21  Yes [provider]  amiodarone (PACERONE) 200 MG tablet Take 400 mg (2 tablets) twice a day through 08/29, then 200 mg (1 tablet) twice a day through 09/12, then reduce to 200 mg daily 01/19/22  Yes Andrey Farmer, PA-C  dapagliflozin propanediol (FARXIGA) 10 MG TABS tablet Take 1 tablet (10 mg total) by mouth daily before breakfast. 09/10/21  Yes Milford, Anderson Malta, FNP  metoprolol succinate (TOPROL-XL) 100 MG 24 hr tablet Take 1 tablet (100 mg total) by mouth 2 (two) times daily. Take with or immediately following a meal. 09/10/21  Yes Milford, Anderson Malta, FNP  Multiple Vitamins-Minerals (CENTRUM MEN) TABS Take 1 tablet by mouth daily.   Yes [provider]  OZEMPIC, 1 MG/DOSE, 4 MG/3ML SOPN Inject 1 mg into the skin once a week. 01/19/22  Yes [provider]  potassium chloride SA (KLOR-CON M) 20 MEQ tablet Take 2 tablets (40 mEq total) by mouth 2 (two) times daily. 01/19/22  Yes Andrey Farmer, PA-C  rivaroxaban (XARELTO) 20 MG TABS tablet Take 1 tablet (20 mg total) by mouth daily with supper. 01/19/22  Yes Andrey Farmer, PA-C  rosuvastatin (CRESTOR) 5 MG tablet Take 1 tablet (5 mg total) by mouth daily. 09/10/21  Yes [place], [place], FNP  sacubitril-valsartan (ENTRESTO) 49-51 MG Take 1 tablet by mouth 2 (two) times daily. 09/10/21  Yes Milford, Anderson Malta, FNP  spironolactone (ALDACTONE) 50 MG tablet Take 1 tablet (50 mg total) by mouth daily. 01/20/22  Yes Andrey Farmer, PA-C  torsemide (DEMADEX) 20 MG tablet Take 1 tablet (20 mg total) by mouth daily. 01/19/22  Yes Andrey Farmer, PA-C      Allergies    Patient has no known allergies.    Review of Systems   Review of Systems  Constitutional:  Positive for fatigue.   Neurological:  Positive for dizziness and weakness (Generalized).  All other systems reviewed and are negative.   Physical Exam Updated Vital Signs BP (!) 85/49   Pulse 78   Temp 99 F (37.2 C) (Oral)   Resp (!) 21   SpO2 93%  Physical Exam Vitals and nursing note reviewed.  Constitutional:      General: Mitchell Jennings is not in acute distress.    Appearance: Normal appearance. Mitchell Jennings is well-developed. Mitchell Jennings is not ill-appearing, toxic-appearing or diaphoretic.  HENT:     Head: Normocephalic and atraumatic.     Right Ear: External ear normal.     Left Ear: External ear normal.     Nose: Nose normal.     Mouth/Throat:     Mouth: Mucous membranes are moist.     Pharynx: Oropharynx is clear.  Eyes:     Extraocular Movements: Extraocular movements intact.     Conjunctiva/sclera: Conjunctivae normal.  Cardiovascular:     Rate and Rhythm: Normal rate and regular rhythm.     Heart sounds: No murmur heard. Pulmonary:     Effort: Pulmonary effort is normal. No respiratory distress.     Breath sounds: Normal breath sounds. No wheezing or rales.  Chest:     Chest wall: No tenderness.  Abdominal:     General: There is no distension.     Palpations: Abdomen is soft.     Tenderness: There is no abdominal tenderness.  Musculoskeletal:        General: No swelling. Normal range of motion.     Cervical back: Normal range of motion and neck supple.     Right lower leg: No edema.     Left lower leg: No edema.  Skin:    General: Skin is warm and dry.     Coloration: Skin is not jaundiced or pale.  Neurological:     General: No focal deficit present.     Mental Status: Mitchell Jennings is alert and oriented to person, place, and time.     Cranial Nerves: No cranial nerve deficit.     Sensory: No sensory deficit.     Motor: No weakness.     Coordination: Coordination normal.  Psychiatric:        Mood and Affect: Mood normal.        Behavior: Behavior normal.        Thought Content: Thought content normal.         Judgment: Judgment normal.     ED Results / Procedures / Treatments   Labs (all labs ordered are listed, but only abnormal results are displayed) Labs Reviewed  CBC WITH DIFFERENTIAL/PLATELET - Abnormal; Notable for the following components:      Result Value   WBC 10.6 (*)    Hemoglobin 18.9 (*)    HCT 55.9 (*)    Monocytes Absolute 1.3 (*)    Basophils Absolute 0.2 (*)  All other components within normal limits  MAGNESIUM - Abnormal; Notable for the following components:   Magnesium 2.5 (*)    All other components within normal limits  COMPREHENSIVE METABOLIC PANEL - Abnormal; Notable for the following components:   Glucose, Bld 100 (*)    BUN 24 (*)    Creatinine, Ser 2.39 (*)    Total Protein 8.6 (*)    GFR, Estimated 31 (*)    All other components within normal limits  SARS CORONAVIRUS 2 BY RT PCR  BRAIN NATRIURETIC PEPTIDE  CK  CBC  BASIC METABOLIC PANEL  TROPONIN I (HIGH SENSITIVITY)  TROPONIN I (HIGH SENSITIVITY)    EKG None  Radiology DG Chest Portable 1 View  Result Date: 01/25/2022 CLINICAL DATA:  Near-syncope EXAM: PORTABLE CHEST 1 VIEW COMPARISON:  Radiographs 01/16/2022 FINDINGS: Stable cardiomegaly. No definite pleural effusion, focal consolidation, or pneumothorax. Prominent pulmonary arteries. Left chest wall ICD. IMPRESSION: No acute cardiopulmonary process.  Stable cardiomegaly. Electronically Signed   By: Minerva Fester M.D.   On: 01/25/2022 20:37    Procedures Procedures    Medications Ordered in ED Medications  Semaglutide(0.25 or 0.5MG /DOS) SOPN 0.5 mg (has no administration in time range)  amiodarone (PACERONE) tablet 400 mg (400 mg Oral Given 01/25/22 2332)  rivaroxaban (XARELTO) tablet 20 mg (20 mg Oral Given 01/25/22 2333)  rosuvastatin (CRESTOR) tablet 5 mg (has no administration in time range)  allopurinol (ZYLOPRIM) tablet 300 mg (has no administration in time range)  multivitamin with minerals tablet 1 tablet (has no  administration in time range)  acetaminophen (TYLENOL) tablet 650 mg (has no administration in time range)    Or  acetaminophen (TYLENOL) suppository 650 mg (has no administration in time range)  ondansetron (ZOFRAN) tablet 4 mg (has no administration in time range)    Or  ondansetron (ZOFRAN) injection 4 mg (has no administration in time range)  lactated ringers bolus 500 mL (0 mLs Intravenous Stopped 01/25/22 2001)    ED Course/ Medical Decision Making/ A&P                           Medical Decision Making Amount and/or Complexity of Data Reviewed Radiology: ordered.  Risk Decision regarding hospitalization.   This patient presents to the ED for concern of near syncope, this involves an extensive number of treatment options, and is a complaint that carries with it a high risk of complications and morbidity.  The differential diagnosis includes dehydration, polypharmacy, arrhythmia, worsening CHF, infection, metabolic derangements   Co morbidities that complicate the patient evaluation  CHF, nonischemic cardiomyopathy, pacemaker placement, atrial fibrillation, obesity, history of DVT, sleep apnea   Additional history obtained:  Additional history obtained from patient's wife External records from outside source obtained and reviewed including EMR   Lab Tests:  I Ordered, and personally interpreted labs.  The pertinent results include: Increased hemoglobin and WBC suggestive of hemoconcentration, elevated creatinine and BUN suggestive of prerenal AKI secondary to dehydration, hypomagnesemia with otherwise normal electrolytes, normal BNP   Imaging Studies ordered:  I ordered imaging studies including chest x-ray I independently visualized and interpreted imaging which showed no acute findings I agree with the radiologist interpretation   Cardiac Monitoring: / EKG:  The patient was maintained on a cardiac monitor.  I personally viewed and interpreted the cardiac  monitored which showed an underlying rhythm of: Sinus rhythm   Problem List / ED Course / Critical interventions / Medication management  Patient  is a pleasant 56 year old male who presents for 2 days of intermittent symptoms of fatigue and near syncope.  Although initial blood pressure in triage was normal, patient was found to be hypotensive upon being bedded in the ED.  Despite this, Mitchell Jennings is well-appearing.  Currently, at rest, Mitchell Jennings denies any current symptoms.  Mitchell Jennings has no neurologic deficits.  His lungs are clear to auscultation and his breathing is unlabored.  SPO2 is normal on room air.  Patient was given 500 cc bolus of IV fluids.  Laboratory work-up was initiated.  On lab work, patient does have normal potassium, which was his primary concern. There were findings of hemoconcentration on CBC and elevated creatinine and BUN suggestive of prerenal AKI.  I suspect that the patient has been over diuresed.  Given his diminished heart function, Mitchell Jennings will need gentle rehydration.  Patient was started on maintenance IV fluids.  Mitchell Jennings was agreeable to admission for gentle hydration and repeat lab work in the morning. I ordered medication including IV fluids for hydration Reevaluation of the patient after these medicines showed that the patient improved I have reviewed the patients home medicines and have made adjustments as needed   Social Determinants of Health:  Has access to outpatient care         Final Clinical Impression(s) / ED Diagnoses Final diagnoses:  Dehydration  Near syncope    Rx / DC Orders ED Discharge Orders     None         Gloris Manchester, MD 01/26/22 0002

## 2022-01-25 NOTE — Assessment & Plan Note (Addendum)
Continue ozempic.   

## 2022-01-25 NOTE — Telephone Encounter (Signed)
Pt called c/o cramping "bad" in legs. Pt said he feels weak and wobbly today he isn't dizzy but he feels off balance. Bp 104/72 heart rate 69. Pt had no other complaints he asked that I send his message to  provider for advice.

## 2022-01-25 NOTE — ED Provider Triage Note (Signed)
Emergency Medicine Provider Triage Evaluation Note  Kristina Bertone. , a 56 y.o. male  was evaluated in triage.  Pt complains of generalized weakness and fatigue x2 days.  He also endorses worsening muscle cramps.  Patient advised by his cardiologist to report to the ED for a BMP, BNP, and mag level.  No chest pain or shortness of breath.  Cardiology also wanted patient evaluated for arrhythmias.  Patient has an ICD.   Review of Systems  Positive: Weakness, fatigue, cramps Negative: fever  Physical Exam  BP (!) 143/82 (BP Location: Right Arm)   Pulse 79   Temp 99 F (37.2 C) (Oral)   Resp (!) 24   SpO2 99%  Gen:   Awake, no distress   Resp:  Normal effort  MSK:   Moves extremities without difficulty  Other:    Medical Decision Making  Medically screening exam initiated at 5:06 PM.  Appropriate orders placed.  [name] was informed that the remainder of the evaluation will be completed by another provider, this initial triage assessment does not replace that evaluation, and the importance of remaining in the ED until their evaluation is complete.  labs   Mannie Stabile, [place] 01/25/22 1708

## 2022-01-25 NOTE — Telephone Encounter (Signed)
Pt tried to transmit while I was on the phone with him and he keeps getting an error message that the battery is low. Pt said it has been on the charger and the charger is plugged in. Pt unable to submit device transmission.

## 2022-01-25 NOTE — H&P (Signed)
History and Physical    Patient: Mitchell Jennings. ZOX:[id number] DOB: [birth year] DOA: 01/25/2022 DOS: the patient was seen and examined on 01/25/2022 PCP: [name], MD  Patient coming from: Home  Chief Complaint:  Chief Complaint  Patient presents with   Dizziness   Weakness   HPI: Mitchell Jennings. is a 56 y.o. male with medical history significant of HFrEF, s/p AICD implant earlier this month and DCd 1 week ago, PAF on Xarelto.  Morbid obesity, bariatric surgery in past, now on ozempic.  Pt presents to ED with c/o generalized weakness, lightheadedness, and fatigue.  Symptoms onset 2 days ago, persistent.  Bilateral muscle cramps in calves overnight.  BP 100/70s at home which is typical for him.  No worsening CP, SOB.   Review of Systems: As mentioned in the history of present illness. All other systems reviewed and are negative. Past Medical History:  Diagnosis Date   CHF (congestive heart failure) (HCC)    Dilated cardiomyopathy (HCC)    Pulmonary hypertension (HCC)    Past Surgical History:  Procedure Laterality Date   BARIATRIC SURGERY     ICD IMPLANT N/A 01/17/2018   Procedure: ICD IMPLANT;  Surgeon: Regan Lemming, MD;  Location: [facility] INVASIVE CV LAB;  Service: Cardiovascular;  Laterality: N/A;   RIGHT/LEFT HEART CATH AND CORONARY ANGIOGRAPHY N/A 07/31/2017   Procedure: RIGHT/LEFT HEART CATH AND CORONARY ANGIOGRAPHY;  Surgeon: Yvonne Kendall, MD;  Location: [facility] INVASIVE CV LAB;  Service: Cardiovascular;  Laterality: N/A;   UMBILICAL HERNIA REPAIR     Social History:  reports that he has never smoked. He has never used smokeless tobacco. He reports that he does not drink alcohol and does not use drugs.  No Known Allergies  Family History  Problem Relation Age of Onset   Hypertension Mother    Lung cancer Mother    Heart failure Mother    Diabetes type II Father    Diabetes type II Brother     Prior to Admission medications   Medication Sig  Start Date End Date Taking? Authorizing Provider  allopurinol (ZYLOPRIM) 300 MG tablet Take 300 mg by mouth daily. 12/27/21   [provider]  amiodarone (PACERONE) 200 MG tablet Take 400 mg (2 tablets) twice a day through 08/29, then 200 mg (1 tablet) twice a day through 09/12, then reduce to 200 mg daily 01/19/22   Andrey Farmer, PA-C  dapagliflozin propanediol (FARXIGA) 10 MG TABS tablet Take 1 tablet (10 mg total) by mouth daily before breakfast. 09/10/21   [place], Anderson Malta, FNP  metoprolol succinate (TOPROL-XL) 100 MG 24 hr tablet Take 1 tablet (100 mg total) by mouth 2 (two) times daily. Take with or immediately following a meal. 09/10/21   [place], Anderson Malta, FNP  Multiple Vitamins-Minerals (CENTRUM MEN) TABS Take 1 tablet by mouth daily.    [provider]  potassium chloride SA (KLOR-CON M) 20 MEQ tablet Take 2 tablets (40 mEq total) by mouth 2 (two) times daily. 01/19/22   Andrey Farmer, PA-C  rivaroxaban (XARELTO) 20 MG TABS tablet Take 1 tablet (20 mg total) by mouth daily with supper. 01/19/22   Andrey Farmer, PA-C  rosuvastatin (CRESTOR) 5 MG tablet Take 1 tablet (5 mg total) by mouth daily. 09/10/21   [place], Anderson Malta, FNP  sacubitril-valsartan (ENTRESTO) 49-51 MG Take 1 tablet by mouth 2 (two) times daily. 09/10/21   [place], Anderson Malta, FNP  Semaglutide (OZEMPIC, 0.25 OR 0.5 MG/DOSE, [place])  Inject 0.5 mg into the skin once a week.    [provider]  spironolactone (ALDACTONE) 50 MG tablet Take 1 tablet (50 mg total) by mouth daily. 01/20/22   Joette Catching, PA-C  torsemide (DEMADEX) 20 MG tablet Take 1 tablet (20 mg total) by mouth daily. 01/19/22   Joette Catching, PA-C    Physical Exam: Vitals:   01/25/22 1815 01/25/22 1900 01/25/22 1930 01/25/22 2000  BP: 93/67 (!) 81/57 104/76 93/61  Pulse: 73 71 76 72  Resp: 18 14 (!) 23 15  Temp:      TempSrc:      SpO2: 95% 98% 96% 98%   Constitutional: NAD, calm, comfortable Eyes:  PERRL, lids and conjunctivae normal ENMT: Mucous membranes are moist. Posterior pharynx clear of any exudate or lesions.Normal dentition.  Neck: normal, supple, no masses, no thyromegaly Respiratory: clear to auscultation bilaterally, no wheezing, no crackles. Normal respiratory effort. No accessory muscle use.  Cardiovascular: Regular rate and rhythm, no murmurs / rubs / gallops. No extremity edema. 2+ pedal pulses. No carotid bruits.  Abdomen: no tenderness, no masses palpated. No hepatosplenomegaly. Bowel sounds positive.  Musculoskeletal: no clubbing / cyanosis. No joint deformity upper and lower extremities. Good ROM, no contractures. Normal muscle tone.  Skin: no rashes, lesions, ulcers. No induration Neurologic: CN 2-12 grossly intact. Sensation intact, DTR normal. Strength 5/5 in all 4.  Psychiatric: Normal judgment and insight. Alert and oriented x 3. Normal mood.   Data Reviewed:       Latest Ref Rng & Units 01/25/2022    5:09 PM 01/19/2022    6:52 AM 01/18/2022    3:52 AM  CMP  Glucose 70 - 99 mg/dL 100  98  96   BUN 6 - 20 mg/dL 24  13  15    Creatinine 0.61 - 1.24 mg/dL 2.39  1.54  1.48   Sodium 135 - 145 mmol/L 138  142  142   Potassium 3.5 - 5.1 mmol/L 4.3  4.0  3.7   Chloride 98 - 111 mmol/L 102  107  109   CO2 22 - 32 mmol/L 24  27  27    Calcium 8.9 - 10.3 mg/dL 9.5  9.3  8.7   Total Protein 6.5 - 8.1 g/dL 8.6     Total Bilirubin 0.3 - 1.2 mg/dL 1.0     Alkaline Phos 38 - 126 U/L 88     AST 15 - 41 U/L 26     ALT 0 - 44 U/L 31      CBC    Component Value Date/Time   WBC 10.6 (H) 01/25/2022 1709   RBC 5.70 01/25/2022 1709   HGB 18.9 (H) 01/25/2022 1709   HGB 14.7 01/09/2018 1045   HCT 55.9 (H) 01/25/2022 1709   HCT 40.0 01/09/2018 1045   PLT 252 01/25/2022 1709   PLT 192 01/09/2018 1045   MCV 98.1 01/25/2022 1709   MCV 92 01/09/2018 1045   MCH 33.2 01/25/2022 1709   MCHC 33.8 01/25/2022 1709   RDW 12.9 01/25/2022 1709   RDW 12.5 01/09/2018 1045    LYMPHSABS 2.8 01/25/2022 1709   MONOABS 1.3 (H) 01/25/2022 1709   EOSABS 0.3 01/25/2022 1709   BASOSABS 0.2 (H) 01/25/2022 1709     Assessment and Plan: * AKI (acute kidney injury) ([place]) Pt with AKI, suspect over diuresis causing pre-renal / ATN. Hold home BP meds Hold diuretics Hold entresto Strict intake and output Repeat BMP in AM Renal US  and nephro consult if not rapidly improving  Chronic HFrEF (heart failure with reduced ejection fraction) (HCC) No acute exacerbation evidence today, CXR clear. Think that patient is dehydrated / over duresed at this point with AKI and mild hypotension in ED. Hold BP meds due to hypotension Hold entresto due to AKI Hold diuretics due to AKI to "gently hydrate" patient. Watch for decompensation of CHF with holding of above meds. Tele monitor Hold Farxiga given AKI  CKD (chronic kidney disease) stage 3, GFR 30-59 ml/min (HCC) Creat 1.5 at baseline, see AKI above.  Morbid obesity with BMI of 50.0-59.9, adult (HCC) Continue ozempic  PAF (paroxysmal atrial fibrillation) (HCC) Cont amiodarone Cont Xarelto      Advance Care Planning:   Code Status: Full Code  Consults: None  Family Communication: No family in room  Severity of Illness: The appropriate patient status for this patient is OBSERVATION. Observation status is judged to be reasonable and necessary in order to provide the required intensity of service to ensure the patient's safety. The patient's presenting symptoms, physical exam findings, and initial radiographic and laboratory data in the context of their medical condition is felt to place them at decreased risk for further clinical deterioration. Furthermore, it is anticipated that the patient will be medically stable for discharge from the hospital within 2 midnights of admission.   Author: Hillary Bow., DO 01/25/2022 9:15 PM  For on call review [email or web address].

## 2022-01-25 NOTE — Telephone Encounter (Signed)
Pt aware and lab ordered mailed.

## 2022-01-25 NOTE — Assessment & Plan Note (Signed)
Creat 1.5 at baseline, see AKI above.

## 2022-01-25 NOTE — Assessment & Plan Note (Signed)
Cont amiodarone Cont Xarelto

## 2022-01-25 NOTE — Assessment & Plan Note (Signed)
Pt with AKI, suspect over diuresis causing pre-renal / ATN. 1. Hold home BP meds 2. Hold diuretics 3. Hold entresto 4. Strict intake and output 5. Repeat BMP in AM 6. Renal US and nephro consult if not rapidly improving

## 2022-01-25 NOTE — Assessment & Plan Note (Addendum)
No acute exacerbation evidence today, CXR clear. Think that patient is dehydrated / over duresed at this point with AKI and mild hypotension in ED. 1. Hold BP meds due to hypotension 2. Hold entresto due to AKI 3. Hold diuretics due to AKI to "gently hydrate" patient. 4. Watch for decompensation of CHF with holding of above meds. 5. Tele monitor 6. Hold Farxiga given AKI 7. Depending on BP may want to resume metoprolol in AM (looks like he runs 100/70 at baseline).

## 2022-01-25 NOTE — ED Triage Notes (Signed)
Pt states he's "swimmy-headed, generalized weakness, exhausted" x2 days.  Recent hospitalization r/t medtronic defib, DC last Wednesday. Reports brief episode of tingling in L hand, since resolved. Pt states he is working w PCP to "regulate electrolytes." Pt in NAD, A&O at time of triage.

## 2022-01-26 ENCOUNTER — Other Ambulatory Visit: Payer: Self-pay

## 2022-01-26 DIAGNOSIS — E860 Dehydration: Secondary | ICD-10-CM

## 2022-01-26 DIAGNOSIS — I5022 Chronic systolic (congestive) heart failure: Secondary | ICD-10-CM | POA: Diagnosis not present

## 2022-01-26 DIAGNOSIS — I959 Hypotension, unspecified: Secondary | ICD-10-CM | POA: Diagnosis not present

## 2022-01-26 DIAGNOSIS — N179 Acute kidney failure, unspecified: Secondary | ICD-10-CM | POA: Diagnosis not present

## 2022-01-26 DIAGNOSIS — I951 Orthostatic hypotension: Secondary | ICD-10-CM

## 2022-01-26 DIAGNOSIS — Z4502 Encounter for adjustment and management of automatic implantable cardiac defibrillator: Secondary | ICD-10-CM

## 2022-01-26 DIAGNOSIS — N183 Chronic kidney disease, stage 3 unspecified: Secondary | ICD-10-CM | POA: Diagnosis not present

## 2022-01-26 LAB — BASIC METABOLIC PANEL
Anion gap: 8 (ref 5–15)
BUN: 24 mg/dL — ABNORMAL HIGH (ref 6–20)
CO2: 29 mmol/L (ref 22–32)
Calcium: 9.5 mg/dL (ref 8.9–10.3)
Chloride: 100 mmol/L (ref 98–111)
Creatinine, Ser: 2.29 mg/dL — ABNORMAL HIGH (ref 0.61–1.24)
GFR, Estimated: 33 mL/min — ABNORMAL LOW (ref >60)
Glucose, Bld: 183 mg/dL — ABNORMAL HIGH (ref 70–99)
Potassium: 4.4 mmol/L (ref 3.5–5.1)
Sodium: 137 mmol/L (ref 135–145)

## 2022-01-26 LAB — CBC
HCT: 52.6 % — ABNORMAL HIGH (ref 39.0–52.0)
Hemoglobin: 17.9 g/dL — ABNORMAL HIGH (ref 13.0–17.0)
MCH: 33.5 pg (ref 26.0–34.0)
MCHC: 34 g/dL (ref 30.0–36.0)
MCV: 98.5 fL (ref 80.0–100.0)
Platelets: 180 10*3/uL (ref 150–400)
RBC: 5.34 MIL/uL (ref 4.22–5.81)
RDW: 12.8 % (ref 11.5–15.5)
WBC: 7.6 10*3/uL (ref 4.0–10.5)
nRBC: 0 % (ref 0.0–0.2)

## 2022-01-26 LAB — TROPONIN I (HIGH SENSITIVITY): Troponin I (High Sensitivity): 6 ng/L (ref <18)

## 2022-01-26 MED ORDER — SODIUM CHLORIDE 0.9 % IV SOLN: INTRAVENOUS | Status: DC

## 2022-01-26 NOTE — Consult Note (Addendum)
Advanced Heart Failure Team Consult Note   Primary Physician: Richardean Chimeraaniel, Terry G, MD PCP-Cardiologist:  None  Reason for Consultation: HF medication management in setting of AKI and Hypotension   HPI:    Mitchell LimesCharles W Cada Jr. is seen today for evaluation of HF medication management in the setting of AKI and hypotension at the request of Dr. Isidoro Donningai, Internal Medicine.   Mitchell Jennings is a [age] year with a history of bariatric surgery, obesity, OSA, CKD Stage III, AAA, DVT RLE, gout, NICM, [employer] ICD, and chronic HFrEF dating back to 2019. Of note he had stopped selenium for 1 year then developed heart failure.    Echo 2021 EF 35%.    Recently admitted 8/13-8/16 after ICD shock at home, in the setting of hypokalemia.  ICD fired x3. Admitted with VT storm. Device interrogation confirmed - appropriate shock x 3 for VT, ATP x 1 --> ? A fib. CXR ok. Optivol not suggestive of fluid overload. Started on amiodarone drip. K 3.1.  K replaced. HS Trop 12>34.  EP consulted. Xarelto started given evidence of Afib on device interrogation.    He was admitted for overnight telemetry monitoring. He had no further VT. Echo was obtained and showed EF 25-30% (35% in past). Given absence of CP and relatively recent cath in 2019 showing no CAD, repeat cardiac cath was felt not indicated.   He was discharged home on PO amiodarone 400 mg bid. Spironolactone was increased to 50 mg daily to help w/ hypokalemia. Home potassium supplement also increased. All other HF meds were continued, including daily torsemide.   Presented back to ED overnight w/ complaints of generalized weakness, fatigue and positional dizziness. Reported increased UOP after diuretic increase w/ 7 lb wt loss in 2 days.  Was hypotensive and w/ AKI. SBPs 80s. SCr 2.39 (up from 1.54 previous admit). CO2 29. LFTs normal. K 4.3. WBC 10>>7.6. AF. Hgb high at 18.9. Hs trop 6>>6. EKG showed SR 82 bpm. BNP 11. CXR w/ no edema.   [facility]. BP active  meds and diuretics held. Given 500 cc IVF bolus. AHF team consulted for recs regarding HF meds.   He feels better this morning. Repeat BP on my check improved, low 100s systolic. SCr 2.39>>2.29. Denies dyspnea. No CP. Denies palpitations. No ICD shocks. ER RN obtaining device interrogation, report pending.     Echo 01/17/22  Left ventricular ejection fraction, by estimation, is 25 to 30%. The left ventricle has severely decreased function. The left ventricle demonstrates global hypokinesis. The left ventricular internal cavity size was moderately dilated. Left ventricular diastolic parameters are consistent with Grade II diastolic dysfunction (pseudonormalization). Elevated left ventricular end-diastolic pressure. The E/e' is 23. 1. 2. Right ventricular systolic function is low normal. The right ventricular size is normal. 3. The mitral valve is abnormal. Trivial mitral valve regurgitation. 4. The aortic valve was not well visualized. Aortic valve regurgitation is not visualized. The inferior vena cava is normal in size with greater than 50% respiratory variability, suggesting right atrial pressure of 3 mmHg.  Review of Systems: [y] = yes, [ ]  = no   General: Weight gain [ ] ; Weight loss [ Y]; Anorexia [ ] ; Fatigue [ Y]; Fever [ ] ; Chills [ ] ; Weakness [Y ]  Cardiac: Chest pain/pressure [ ] ; Resting SOB [ ] ; Exertional SOB [ ] ; Orthopnea [ ] ; Pedal Edema [ ] ; Palpitations [ ] ; Syncope [ ] ; Presyncope [ ] ; Paroxysmal nocturnal dyspnea[ ]   Pulmonary:  Cough [ ] ; Wheezing[ ] ; Hemoptysis[ ] ; Sputum [ ] ; Snoring [ ]   GI: Vomiting[ ] ; Dysphagia[ ] ; Melena[ ] ; Hematochezia [ ] ; Heartburn[ ] ; Abdominal pain [ ] ; Constipation [ ] ; Diarrhea [ ] ; BRBPR [ ]   GU: Hematuria[ ] ; Dysuria [ ] ; Nocturia[ ]   Vascular: Pain in legs with walking [ ] ; Pain in feet with lying flat [ ] ; Non-healing sores [ ] ; Stroke [ ] ; TIA [ ] ; Slurred speech [ ] ;  Neuro: Headaches[ ] ; Vertigo[ ] ; Seizures[ ] ; Paresthesias[  ];Blurred vision [ ] ; Diplopia [ ] ; Vision changes [ ]   Ortho/Skin: Arthritis [ ] ; Joint pain [ ] ; Muscle pain [ ] ; Joint swelling [ ] ; Back Pain [ ] ; Rash [ ]   Psych: Depression[ ] ; Anxiety[ ]   Heme: Bleeding problems [ ] ; Clotting disorders [ ] ; Anemia [ ]   Endocrine: Diabetes [ ] ; Thyroid dysfunction[ ]   Home Medications Prior to Admission medications   Medication Sig Start Date End Date Taking? Authorizing Provider  allopurinol (ZYLOPRIM) 300 MG tablet Take 300 mg by mouth daily. 12/27/21  Yes [provider]  amiodarone (PACERONE) 200 MG tablet Take 400 mg (2 tablets) twice a day through 08/29, then 200 mg (1 tablet) twice a day through 09/12, then reduce to 200 mg daily 01/19/22  Yes Joette Catching, PA-C  dapagliflozin propanediol (FARXIGA) 10 MG TABS tablet Take 1 tablet (10 mg total) by mouth daily before breakfast. 09/10/21  Yes [place], Maricela Bo, FNP  metoprolol succinate (TOPROL-XL) 100 MG 24 hr tablet Take 1 tablet (100 mg total) by mouth 2 (two) times daily. Take with or immediately following a meal. 09/10/21  Yes [place], Maricela Bo, FNP  Multiple Vitamins-Minerals (CENTRUM MEN) TABS Take 1 tablet by mouth daily.   Yes [provider]  OZEMPIC, 1 MG/DOSE, 4 MG/3ML SOPN Inject 1 mg into the skin once a week. 01/19/22  Yes [provider]  potassium chloride SA (KLOR-CON M) 20 MEQ tablet Take 2 tablets (40 mEq total) by mouth 2 (two) times daily. 01/19/22  Yes Joette Catching, PA-C  rivaroxaban (XARELTO) 20 MG TABS tablet Take 1 tablet (20 mg total) by mouth daily with supper. 01/19/22  Yes Joette Catching, PA-C  rosuvastatin (CRESTOR) 5 MG tablet Take 1 tablet (5 mg total) by mouth daily. 09/10/21  Yes [place], Maricela Bo, FNP  sacubitril-valsartan (ENTRESTO) 49-51 MG Take 1 tablet by mouth 2 (two) times daily. 09/10/21  Yes [place], Maricela Bo, FNP  spironolactone (ALDACTONE) 50 MG tablet Take 1 tablet (50 mg total) by mouth daily. 01/20/22  Yes Joette Catching, PA-C  torsemide (DEMADEX) 20 MG tablet Take 1 tablet (20 mg total) by mouth daily. 01/19/22  Yes Joette Catching, PA-C    Past Medical History: Past Medical History:  Diagnosis Date   CHF (congestive heart failure) ([place])    Dilated cardiomyopathy ([place])    Pulmonary hypertension ([place])     Past Surgical History: Past Surgical History:  Procedure Laterality Date   BARIATRIC SURGERY     ICD IMPLANT N/A 01/17/2018   Procedure: ICD IMPLANT;  Surgeon: Constance Haw, MD;  Location: [place] [facility];  Service: Cardiovascular;  Laterality: N/A;   RIGHT/LEFT HEART CATH AND CORONARY ANGIOGRAPHY N/A 07/31/2017   Procedure: RIGHT/LEFT HEART CATH AND CORONARY ANGIOGRAPHY;  Surgeon: Nelva Bush, MD;  Location: [place] [facility];  Service: Cardiovascular;  Laterality: N/A;   UMBILICAL HERNIA REPAIR      Family History: Family History  Problem  Relation Age of Onset   Hypertension Mother    Lung cancer Mother    Heart failure Mother    Diabetes type II Father    Diabetes type II Brother     Social History: Social History   Socioeconomic History   Marital status: Married    Spouse name: Not on file   Number of children: Not on file   Years of education: Not on file   Highest education level: Not on file  Occupational History   Not on file  Tobacco Use   Smoking status: Never   Smokeless tobacco: Never  Vaping Use   Vaping Use: Never used  Substance and Sexual Activity   Alcohol use: No   Drug use: No   Sexual activity: Not on file  Other Topics Concern   Not on file  Social History Narrative   Not on file   Social Determinants of Health   Financial Resource Strain: Not on file  Food Insecurity: Not on file  Transportation Needs: Not on file  Physical Activity: Not on file  Stress: Not on file  Social Connections: Not on file    Allergies:  No Known Allergies  Objective:    Vital Signs:   Temp:  [98.5 F (36.9 C)-99 F (37.2 C)]  98.5 F (36.9 C) (08/23 0730) Pulse Rate:  [61-81] 63 (08/23 0730) Resp:  [9-33] 9 (08/23 0730) BP: (81-143)/(49-82) 88/49 (08/23 0947) SpO2:  [88 %-100 %] 100 % (08/23 0730)    Weight change: There were no vitals filed for this visit.  Intake/Output:   Intake/Output Summary (Last 24 hours) at 01/26/2022 1038 Last data filed at 01/25/2022 2001 Gross per 24 hour  Intake 500 ml  Output --  Net 500 ml      Physical Exam    General: super morbidly obese. No resp difficulty HEENT: normal Neck: supple. Thick neck, unable to visualize JVD . Carotids 2+ bilat; no bruits. No lymphadenopathy or thyromegaly appreciated. Cor: PMI nondisplaced. Regular rate & rhythm. No rubs, gallops or murmurs. Lungs: clear Abdomen: obese, soft, nontender, nondistended. No hepatosplenomegaly. No bruits or masses. Good bowel sounds. Extremities: no cyanosis, clubbing, rash. + chronic venous stasis dermatitis bilaterally but no current edema present  Neuro: alert & orientedx3, cranial nerves grossly intact. moves all 4 extremities w/o difficulty. Affect pleasant   Telemetry   NSR 80s. No VT   EKG    NSR 82 bpm   Labs   Basic Metabolic Panel: Recent Labs  Lab 01/25/22 1709 01/26/22 0850  NA 138 137  K 4.3 4.4  CL 102 100  CO2 24 29  GLUCOSE 100* 183*  BUN 24* 24*  CREATININE 2.39* 2.29*  CALCIUM 9.5 9.5  MG 2.5*  --     Liver Function Tests: Recent Labs  Lab 01/25/22 1709  AST 26  ALT 31  ALKPHOS 88  BILITOT 1.0  PROT 8.6*  ALBUMIN 4.6   No results for input(s): "LIPASE", "AMYLASE" in the last 168 hours. No results for input(s): "AMMONIA" in the last 168 hours.  CBC: Recent Labs  Lab 01/25/22 1709 01/26/22 0850  WBC 10.6* 7.6  NEUTROABS 6.0  --   HGB 18.9* 17.9*  HCT 55.9* 52.6*  MCV 98.1 98.5  PLT 252 180    Cardiac Enzymes: Recent Labs  Lab 01/25/22 1709  CKTOTAL 73    BNP: BNP (last 3 results) Recent Labs    09/10/21 1020 01/25/22 1709  BNP 39.7  11.3  ProBNP (last 3 results) No results for input(s): "PROBNP" in the last 8760 hours.   CBG: No results for input(s): "GLUCAP" in the last 168 hours.  Coagulation Studies: No results for input(s): "LABPROT", "INR" in the last 72 hours.   Imaging   DG Chest Portable 1 View  Result Date: 01/25/2022 CLINICAL DATA:  Near-syncope EXAM: PORTABLE CHEST 1 VIEW COMPARISON:  Radiographs 01/16/2022 FINDINGS: Stable cardiomegaly. No definite pleural effusion, focal consolidation, or pneumothorax. Prominent pulmonary arteries. Left chest wall ICD. IMPRESSION: No acute cardiopulmonary process.  Stable cardiomegaly. Electronically Signed   By: Minerva Fester M.D.   On: 01/25/2022 20:37     Medications:     Current Medications:  allopurinol  300 mg Oral Daily   amiodarone  400 mg Oral BID   multivitamin with minerals  1 tablet Oral Daily   rivaroxaban  20 mg Oral Q supper   rosuvastatin  5 mg Oral Daily   [START ON 01/28/2022] Semaglutide(0.25 or 0.5MG /DOS)  0.5 mg Subcutaneous Weekly    Infusions:     Patient Profile   56 y/o male w/ super morbid obesity, s/p bariatric surgery, chronic systolic heart failure due to NICM, h/o VT s/p ICD, OSA, CKD Stage III, AAA, DVT RLE and gout. Recent admit earlier this month for VT storm and ICD shocks, in setting of hypokalemia. Spironolactone dose ultimately increased to 50 mg daily. Discharged home on PO amio.  Now readmitted for AKI and hypotension, presumed dehydration.   Assessment/Plan   AKI on Stage III CKD - Scr 2.39 on admit (up from 1.54 previous admit) - suspect prerenal, due to dehydration from overdiuresis/ hypotension  - he has known low EF but exam not concerning for low output. CO2 29. LFTs nl  - SCr starting to trend down, 2.29 today  - continue to hold BP active meds and diuretics for now - follow BMP   2. Hypotension  - suspect 2/2 overdiuresis +/- polypharmacy. On multiple BP active meds w/ recent dose titration of  spironolactone - hold diuretics and BP active meds for now  - BP improved w/ IVF hydration  - once AKI resolved and able to resume GDMT, suspect he will need lower dose Entresto (24-26 mg bid)   3. Chronic Systolic Heart Failure - NICM. Has ICD - Most Recent Echo 7/23 EF 25-30% (35% prior study), RV low normal. IVC not dilated - [facility] Class II-III chronically, confounded by super morbid obesity and deconditioning  - volume assessment by exam difficult given obesity/body habitus, but suspect he is dry given labs (AKI + high hgb) - Obtain device interrogation to check Optivol - Holding diuretics and GDMT for now given AKI and hypotension  - once improved and able to restart meds, would change torsemide to PRN and will likely need lower dose Entrseto  - we will follow closely along w/ you   4. H/o VT - in setting of NICM, has ICD - recent admit earlier this month for VT storm + ICD shocks x 3, in setting of hypokalemia - denies palpitations. No further ICD shocks - obtain device interrogation  - continue PO amiodarone 400 mg bid. Per EP will need 400 mg bid x 2 weeks (reduce to 200 mg bid starting 8/30 x 2 wks>>200 mg once daily thereafter) - monitor K closely, currently stable 4.4. Mg stable 2.5   5. PAF - in SR - continue Amiodarone and Xarelto  6. H/o LE DVT - continue Xarelto   7. OSA - CPAP  QHS    Length of Stay: 0  Robbie Lis, PA-C  01/26/2022, 10:38 AM  Advanced Heart Failure Team Pager [phone] (M-F; 7a - 5p)  Please contact [facility] for night-coverage after hours (4p -7a ) and weekends on [email or web address]  Patient seen and examined with the above-signed Advanced Practice Provider and/or Housestaff. I personally reviewed laboratory data, imaging studies and relevant notes. I independently examined the patient and formulated the important aspects of the plan. I have edited the note to reflect any of my changes or salient points. I have personally discussed the plan  with the patient and/or family.  56 y/o obese male with systolic HF due to NICM. Recenlty admitted with VT storm and ADHF. VT suppressed and diuresed well.   Now presents with symptomatic volume depletion and AKI.   BP has responded well to IVF. Feels ok now. Rhythm stable. SCr 1.5 -> 2.4  General:  Obese male. No resp difficulty HEENT: normal Neck: supple. no JVD. Carotids 2+ bilat; no bruits. No lymphadenopathy or thryomegaly appreciated. Cor: PMI nondisplaced. Regular rate & rhythm. No rubs, gallops or murmurs. Lungs: clear Abdomen: obese soft, nontender, nondistended. No hepatosplenomegaly. No bruits or masses. Good bowel sounds. Extremities: no cyanosis, clubbing, rash, edema Neuro: alert & orientedx3, cranial nerves grossly intact. moves all 4 extremities w/o difficulty. Affect pleasant  Continue IVF. Hold diuretics. If renal function improving and BP stable likely home in am. Stop torsemide. Hold [place].   Arvilla Meres, MD  4:57 PM

## 2022-01-26 NOTE — ED Notes (Signed)
Eating, given urinal, labs sent, family at [facility]

## 2022-01-26 NOTE — ED Notes (Signed)
Pt alert, NAD, calm, interactive, sitting upright, BP improved, Cards at [facility] now, AICD interrogated, no changes

## 2022-01-26 NOTE — ED Notes (Signed)
Report given to John C, RN  

## 2022-01-26 NOTE — Progress Notes (Addendum)
[facility]                                                                              Mitchell Jennings, is a 56 y.o. male, DOB - 1966/02/19, [id number] Admit date - 01/25/2022    Outpatient Primary MD for the patient is [name], MD  LOS - 0  days  Chief Complaint  Patient presents with   Dizziness   Weakness       Brief summary   Patient is a 56 year old male with a history of HFrEF, EF 35% on echo, 04/2020 AICD placement in 2019, CKD stage III, AAA, recurrent DVT RLE, gout, NICM obesity, bariatric surgery, recently admitted 8/13-8/16 after ICD shock at home, also was found to have A-fib, volume overload.  Patient was discharged home on amiodarone, spironolactone was increased to 50 mg daily. Patient presented to ED with dizziness, feeling very unsteady, generalized weakness, fatigue, was hypotensive with borderline BP in 80s.  Creatinine 2.39, was 1.54 on previous admit.  Hb 17.9 Antihypertensives and diuretics held, given 500 cc IV fluid.  Cardiology consulted.  Assessment & Plan    Principal Problem:   AKI (acute kidney injury) (HCC) superimposed on CKD (chronic kidney disease) stage 3b, GFR 30-59 ml/min (HCC) Hypotension -Presented with creatinine of 2.39, was 1.54 on 01/19/2022.  Borderline hypotensive -BP 78/45, orthostatic vitals positive, hold all antihypertensives and diuretics - patient received 500 cc IV fluids, creatinine improving to 2.2 -Hold Demadex, Aldactone, Entresto, Toprol XL, Farxiga -Will place on gentle IV fluid hydration, cardiology consulted will follow recommendations.  Chest x-ray with no overt edema, no peripheral edema, per patient he has lost 8 pounds in the last week, likely volume depleted. -Follow 2D echo  Active Problems: Hx VT,  ICD (implantable cardioverter-defibrillator)  -Recently discharged on 01/19/2022 from cardiology service and patient was admitted with VT storm, was discharged on amiodarone -Now presenting  with dizziness, hypotension, cardiology consulted, will follow recommendations -Resumed amiodarone, patient is supposed to be on 400 mg twice daily through 8/29 then reduced to 200 mg twice daily through 9/12 then reduce to 200 mg daily   Orthostatic hypotension -As #1, hold Demadex, Aldactone, Entresto, Toprol-XL.  Orthostatic vital signs positive. - placed on gentle hydration, monitor for any fluid overload    Chronic HFrEF (heart failure with reduced ejection fraction) (HCC) -Currently appears volume depleted, holding diuretics, placed on gentle hydration -Cardiology consulted, follow 2D echo    PAF (paroxysmal atrial fibrillation) (HCC) -Was noted after VT with ICD shock. - continue amiodarone, xarelto   History of DVT  -Continue Xarelto     Morbid obesity with BMI of 50.0-59.9, adult (HCC) Estimated body mass index is 55.21 kg/m as calculated from the following:   Height as of 01/17/22: 6\' 2"  (1.88 m).   Weight as of 01/19/22: 195 kg. -Currently on Ozempic, will continue  Code Status: full  DVT Prophylaxis:  rivaroxaban (XARELTO) tablet 20 mg Start: 01/25/22 2230 rivaroxaban (XARELTO) tablet 20 mg   Level of Care: Level of care: Telemetry Medical Family Communication: Updated patient   Disposition Plan:      Remains inpatient  appropriate: Currently feeling very unsteady, PT evaluation ordered.  Orthostatic positive.  Not safe for discharge home.   Procedures:  None  Consultants:   Cardiology    Medications  allopurinol  300 mg Oral Daily   amiodarone  400 mg Oral BID   multivitamin with minerals  1 tablet Oral Daily   rivaroxaban  20 mg Oral Q supper   rosuvastatin  5 mg Oral Daily   [START ON 01/28/2022] Semaglutide(0.25 or 0.5MG /DOS)  0.5 mg Subcutaneous Weekly      Subjective:   Mitchell Jennings was seen and examined today.  Still feeling dizzy and unsteady.  Patient denies dizziness, chest pain, shortness of breath, abdominal pain, N/V/D/C.  No vertigo,  numbness or tingling, focal weakness.   Objective:   Vitals:   01/26/22 0445 01/26/22 0500 01/26/22 0515 01/26/22 0730  BP: 99/71 101/63 97/80 104/67  Pulse: 66 66 68 63  Resp: 14 18 11  (!) 9  Temp:    98.5 F (36.9 C)  TempSrc:    Oral  SpO2: 96% 100% 98% 100%    Intake/Output Summary (Last 24 hours) at 01/26/2022 0824 Last data filed at 01/25/2022 2001 Gross per 24 hour  Intake 500 ml  Output --  Net 500 ml     Wt Readings from Last 3 Encounters:  01/19/22 (!) 195 kg  09/10/21 (!) 203.4 kg  11/30/20 (!) 206 kg     Exam General: Alert and oriented x 3, NAD Cardiovascular: S1 S2 auscultated,  RRR Respiratory: Clear to auscultation bilaterally, no wheezing, rales  Gastrointestinal: obese, soft, nontender, nondistended, + bowel sounds Ext: no pedal edema bilaterally Neuro: Strength 5/5 upper and lower extremities bilaterally Psych: Normal affect and demeanor, alert and oriented x3     Data Reviewed:  I have personally reviewed following labs    CBC Lab Results  Component Value Date   WBC 10.6 (H) 01/25/2022   RBC 5.70 01/25/2022   HGB 18.9 (H) 01/25/2022   HCT 55.9 (H) 01/25/2022   MCV 98.1 01/25/2022   MCH 33.2 01/25/2022   PLT 252 01/25/2022   MCHC 33.8 01/25/2022   RDW 12.9 01/25/2022   LYMPHSABS 2.8 01/25/2022   MONOABS 1.3 (H) 01/25/2022   EOSABS 0.3 01/25/2022   BASOSABS 0.2 (H) 01/25/2022     Last metabolic panel Lab Results  Component Value Date   NA 138 01/25/2022   K 4.3 01/25/2022   CL 102 01/25/2022   CO2 24 01/25/2022   BUN 24 (H) 01/25/2022   CREATININE 2.39 (H) 01/25/2022   GLUCOSE 100 (H) 01/25/2022   GFRNONAA 31 (L) 01/25/2022   GFRAA >60 08/14/2018   CALCIUM 9.5 01/25/2022   PROT 8.6 (H) 01/25/2022   ALBUMIN 4.6 01/25/2022   BILITOT 1.0 01/25/2022   ALKPHOS 88 01/25/2022   AST 26 01/25/2022   ALT 31 01/25/2022   ANIONGAP 12 01/25/2022      Radiology Studies: I have personally reviewed the imaging studies  DG Chest  Portable 1 View  Result Date: 01/25/2022 CLINICAL DATA:  Near-syncope IMPRESSION: No acute cardiopulmonary process.  Stable cardiomegaly. Electronically Signed   By: 01/27/2022 M.D.   On: 01/25/2022 20:37       Kaisa Wofford M.D. [facility] 01/26/2022, 8:24 AM  Available via Epic secure chat 7am-7pm After 7 pm, please refer to night coverage provider listed on amion.

## 2022-01-26 NOTE — ED Notes (Signed)
Call and fax received from [employer] on AICD interrogation: no events, arrhythmias, treatments. Last cleared on 8/13. "Suggests fluid on lungs". Device functioning properly.

## 2022-01-26 NOTE — Evaluation (Signed)
Physical Therapy Evaluation Patient Details Name: Mitchell Jennings. MRN: [id number] DOB: 1965/12/25 Today's Date: 01/26/2022  History of Present Illness  Pt is a 56 y/o male admitted secondary to weakness, fatigue and lightheadedness. Found to have AKI. PMH includes CHF, and pulmonary HTN.  Clinical Impression  Pt admitted secondary to problem above with deficits below. Pt requiring supervision to stand at EOB this session. Pt with + orthostatics, so further mobility deferred. See below. Anticipate pt will progress well once symptoms improve. Will continue to follow acutely.    01/26/22 0943  Orthostatic Sitting  BP- Sitting 91/41  Pulse- Sitting 79  Orthostatic Standing at 0 minutes  BP- Standing at 0 minutes (!) 80/41  Pulse- Standing at 0 minutes 86         Recommendations for follow up therapy are one component of a multi-disciplinary discharge planning process, led by the attending physician.  Recommendations may be updated based on patient status, additional functional criteria and insurance authorization.  Follow Up Recommendations No PT follow up      Assistance Recommended at Discharge Intermittent Supervision/Assistance  Patient can return home with the following  Assistance with cooking/housework    Equipment Recommendations None recommended by PT  Recommendations for Other Services       Functional Status Assessment Patient has had a recent decline in their functional status and demonstrates the ability to make significant improvements in function in a reasonable and predictable amount of time.     Precautions / Restrictions Precautions Precautions: None Restrictions Weight Bearing Restrictions: No      Mobility  Bed Mobility Overal bed mobility: Modified Independent                  Transfers Overall transfer level: Needs assistance Equipment used: None Transfers: Sit to/from Stand Sit to Stand: Supervision           General transfer  comment: Supervision for safety. BP dropping to 80/41, so further mobility deferred.    Ambulation/Gait                  Stairs            Wheelchair Mobility    Modified Rankin (Stroke Patients Only)       Balance Overall balance assessment: Mild deficits observed, not formally tested                                           Pertinent Vitals/Pain Pain Assessment Pain Assessment: No/denies pain    Home Living Family/patient expects to be discharged to:: Private residence Living Arrangements: Spouse/significant other;Children Available Help at Discharge: Family Type of Home: House Home Access: Stairs to enter Entrance Stairs-Rails: Doctor, general practice of Steps: 3   Home Layout: One level Home Equipment: None      Prior Function Prior Level of Function : Independent/Modified Independent               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     Higher education careers adviser        Extremity/Trunk Assessment   Upper Extremity Assessment Upper Extremity Assessment: Overall WFL for tasks assessed    Lower Extremity Assessment Lower Extremity Assessment: Overall WFL for tasks assessed    Cervical / Trunk Assessment Cervical / Trunk Assessment: Other exceptions Cervical / Trunk Exceptions: increased body habitus  Communication   Communication: No  difficulties  Cognition Arousal/Alertness: Awake/alert Behavior During Therapy: WFL for tasks assessed/performed Overall Cognitive Status: Within Functional Limits for tasks assessed                                          General Comments General comments (skin integrity, edema, etc.): Pt's wife present throughout    Exercises     Assessment/Plan    PT Assessment Patient needs continued PT services  PT Problem List Decreased activity tolerance;Decreased mobility;Cardiopulmonary status limiting activity       PT Treatment Interventions DME  instruction;Gait training;Functional mobility training;Stair training;Therapeutic activities;Therapeutic exercise;Balance training;Patient/family education    PT Goals (Current goals can be found in the Care Plan section)  Acute Rehab PT Goals Patient Stated Goal: to go home PT Goal Formulation: With patient/family Time For Goal Achievement: 02/09/22 Potential to Achieve Goals: Good    Frequency Min 3X/week     Co-evaluation               AM-PAC PT "6 Clicks" Mobility  Outcome Measure Help needed turning from your back to your side while in a flat bed without using bedrails?: None Help needed moving from lying on your back to sitting on the side of a flat bed without using bedrails?: None Help needed moving to and from a bed to a chair (including a wheelchair)?: A Little Help needed standing up from a chair using your arms (e.g., wheelchair or bedside chair)?: A Little Help needed to walk in hospital room?: A Little Help needed climbing 3-5 steps with a railing? : A Lot 6 Click Score: 19    End of Session   Activity Tolerance: Treatment limited secondary to medical complications (Comment) (+ orthostatics) Patient left: in bed;with call bell/phone within reach;with family/visitor present (on stretcher in ED) Nurse Communication: Mobility status;Other (comment) (pt requesting to get bariatric bed) PT Visit Diagnosis: Other abnormalities of gait and mobility (R26.89);Difficulty in walking, not elsewhere classified (R26.2)    Time: 3785-8850 PT Time Calculation (min) (ACUTE ONLY): 11 min   Charges:   PT Evaluation $PT Eval Moderate Complexity: 1 Mod          Farley Ly, PT, DPT  Acute Rehabilitation Services  Office: [phone]   Lehman Prom 01/26/2022, 10:16 AM

## 2022-01-26 NOTE — ED Notes (Signed)
PT in to see, family at [facility].

## 2022-01-26 NOTE — ED Notes (Signed)
Breakfast order placed ?

## 2022-01-27 DIAGNOSIS — E860 Dehydration: Secondary | ICD-10-CM | POA: Diagnosis not present

## 2022-01-27 DIAGNOSIS — N179 Acute kidney failure, unspecified: Secondary | ICD-10-CM | POA: Diagnosis not present

## 2022-01-27 DIAGNOSIS — N183 Chronic kidney disease, stage 3 unspecified: Secondary | ICD-10-CM | POA: Diagnosis not present

## 2022-01-27 DIAGNOSIS — I5022 Chronic systolic (congestive) heart failure: Secondary | ICD-10-CM | POA: Diagnosis not present

## 2022-01-27 DIAGNOSIS — R55 Syncope and collapse: Secondary | ICD-10-CM

## 2022-01-27 LAB — CBC
HCT: 49.9 % (ref 39.0–52.0)
Hemoglobin: 16.8 g/dL (ref 13.0–17.0)
MCH: 32.9 pg (ref 26.0–34.0)
MCHC: 33.7 g/dL (ref 30.0–36.0)
MCV: 97.8 fL (ref 80.0–100.0)
Platelets: 173 10*3/uL (ref 150–400)
RBC: 5.1 MIL/uL (ref 4.22–5.81)
RDW: 12.7 % (ref 11.5–15.5)
WBC: 7.2 10*3/uL (ref 4.0–10.5)
nRBC: 0 % (ref 0.0–0.2)

## 2022-01-27 LAB — BASIC METABOLIC PANEL
Anion gap: 8 (ref 5–15)
BUN: 22 mg/dL — ABNORMAL HIGH (ref 6–20)
CO2: 25 mmol/L (ref 22–32)
Calcium: 8.9 mg/dL (ref 8.9–10.3)
Chloride: 108 mmol/L (ref 98–111)
Creatinine, Ser: 1.98 mg/dL — ABNORMAL HIGH (ref 0.61–1.24)
GFR, Estimated: 39 mL/min — ABNORMAL LOW (ref >60)
Glucose, Bld: 101 mg/dL — ABNORMAL HIGH (ref 70–99)
Potassium: 4.9 mmol/L (ref 3.5–5.1)
Sodium: 141 mmol/L (ref 135–145)

## 2022-01-27 MED ORDER — ENTRESTO 24-26 MG PO TABS: 1.0000 | ORAL_TABLET | Freq: Two times a day (BID) | ORAL | 3 refills | Status: DC

## 2022-01-27 MED ORDER — SPIRONOLACTONE 25 MG PO TABS: 25.0000 mg | ORAL_TABLET | Freq: Every day | ORAL | 11 refills | Status: DC

## 2022-01-27 MED ORDER — AMIODARONE HCL 200 MG PO TABS: 200.0000 mg | ORAL_TABLET | Freq: Two times a day (BID) | ORAL | 1 refills | Status: DC

## 2022-01-27 MED ORDER — METOPROLOL SUCCINATE ER 100 MG PO TB24: 100.0000 mg | ORAL_TABLET | Freq: Two times a day (BID) | ORAL | 6 refills | Status: DC

## 2022-01-27 MED ORDER — TORSEMIDE 20 MG PO TABS: 20.0000 mg | ORAL_TABLET | Freq: Every day | ORAL | 11 refills | Status: DC | PRN

## 2022-01-27 NOTE — Discharge Summary (Signed)
Physician Discharge Summary   Patient: Mitchell Jennings. MRN: [id number] DOB: [birth year]  Admit date:     01/25/2022  Discharge date: 01/27/22  Discharge Physician: Estill Cotta, MD    PCP: Caryl Bis, MD   Recommendations at discharge:   Spironolactone decreased to 25 mg daily  Entresto decreased to 24/26 twice daily Torsemide changed to as needed per cardiology recommendations Amiodarone decreased to 200 mg twice daily for 10 days then taper to 200 mg daily  Discharge Diagnoses:    AKI (acute kidney injury) ([place])   Chronic HFrEF (heart failure with reduced ejection fraction) (HCC)   PAF (paroxysmal atrial fibrillation) ([place])   Morbid obesity with BMI of 50.0-59.9, adult (HCC)   CKD (chronic kidney disease) stage 3, GFR 30-59 ml/min (HCC)   ICD (implantable cardioverter-defibrillator) discharge Orthostatic hypotension   Hospital Course:  Patient is a 56 year old male with a history of HFrEF, EF 35% on echo, 04/2020 AICD placement in 2019, CKD stage III, AAA, recurrent DVT RLE, gout, NICM obesity, bariatric surgery, recently admitted 8/13-8/16 after ICD shock at home, also was found to have A-fib, volume overload.  Patient was discharged home on amiodarone, spironolactone was increased to 50 mg daily. Patient presented to ED with dizziness, feeling very unsteady, generalized weakness, fatigue, was hypotensive with borderline BP in 80s.  Creatinine 2.39, was 1.54 on previous admit.  Hb 17.9 Antihypertensives and diuretics held, patient was admitted for further work-up.  Cardiology was consulted.     Assessment and Plan:   AKI (acute kidney injury) ([place]) superimposed on CKD (chronic kidney disease) stage 3b, GFR 30-59 ml/min (HCC) Hypotension -Presented with creatinine of 2.39, was 1.54 on 01/19/2022.  Borderline hypotensive -BP 78/45, orthostatic vitals positive, held all antihypertensives and diuretics -Patient received IV fluid hydration, -Held Demadex, Aldactone,  Entresto, Toprol XL, Farxiga - Chest x-ray with no overt edema, no peripheral edema, per patient he has lost 8 pounds in the last week, likely volume depleted. -Patient was followed by CHF team, torsemide was changed to as needed, continue Aldactone, decreased to 25 mg daily, Entresto decreased to 24/26 mg twice daily    Hx VT,  ICD (implantable cardioverter-defibrillator)  -Recently discharged on 01/19/2022 from cardiology service and patient was admitted with VT storm, was discharged on amiodarone -Presented with dizziness, hypotension, cardiology consulted, will follow recommendations -Amiodarone was resumed.  Discussed with cardiology, Dr. Aundra Dubin, recommended decrease to 200 mg twice daily for 10 days then taper down to 200 mg daily at discharge.   -Continue Toprol-XL 100 twice daily.     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 - continue amiodarone, decrease dose as above, continue beta-blocker, Xarelto    History of DVT  -Continue Xarelto       Morbid obesity with BMI of 50.0-59.9, adult (HCC) Estimated body mass index is 55.21 kg/m as calculated from the following:   Height as of 01/17/22: 6\' 2"  (1.88 m).   Weight as of 01/19/22: 195 kg. -Currently on Ozempic, will continue  Pain control - [employer] Controlled Substance Reporting System database was reviewed. and patient was instructed, not to drive, operate heavy machinery, perform activities at heights, swimming or participation in water  activities or provide baby-sitting services while on Pain, Sleep and Anxiety Medications; until their outpatient Physician has advised to do so again. Also recommended to not to take more than prescribed Pain, Sleep and Anxiety Medications.  Consultants: Cardiology Procedures performed: 2D echo Disposition: Home Diet recommendation:  Discharge Diet Orders (From admission, onward)     Start     Ordered   01/27/22 0000  Diet - low sodium heart healthy        01/27/22 1330           Cardiac diet DISCHARGE MEDICATION: Allergies as of 01/27/2022   No Known Allergies      Medication List     STOP taking these medications    Entresto 49-51 MG Generic drug: sacubitril-valsartan Replaced by: [name] 24-26 MG   potassium chloride SA 20 MEQ tablet Commonly known as: KLOR-CON M       TAKE these medications    allopurinol 300 MG tablet Commonly known as: ZYLOPRIM Take 300 mg by mouth daily.   amiodarone 200 MG tablet Commonly known as: PACERONE Take 1 tablet (200 mg total) by mouth 2 (two) times daily. X 10 days, then taper down to to 200 mg PO daily What changed:  how much to take how to take this when to take this additional instructions   Centrum Men Tabs Take 1 tablet by mouth daily.   dapagliflozin propanediol 10 MG Tabs tablet Commonly known as: Farxiga Take 1 tablet (10 mg total) by mouth daily before breakfast.   Entresto 24-26 MG Generic drug: sacubitril-valsartan Take 1 tablet by mouth 2 (two) times daily. Replaces: Entresto 49-51 MG   metoprolol succinate 100 MG 24 hr tablet Commonly known as: TOPROL-XL Take 1 tablet (100 mg total) by mouth 2 (two) times daily. What changed: additional instructions   Ozempic (1 MG/DOSE) 4 MG/3ML Sopn Generic drug: Semaglutide (1 MG/DOSE) Inject 1 mg into the skin once a week.   rivaroxaban 20 MG Tabs tablet Commonly known as: XARELTO Take 1 tablet (20 mg total) by mouth daily with supper.   rosuvastatin 5 MG  tablet Commonly known as: CRESTOR Take 1 tablet (5 mg total) by mouth daily.   spironolactone 25 MG tablet Commonly known as: Aldactone Take 1 tablet (25 mg total) by mouth daily. What changed:  medication strength how much to take   torsemide 20 MG tablet Commonly known as: DEMADEX Take 1 tablet (20 mg total) by mouth daily as needed (Fluid overload, edema). What changed:  when to take this reasons to take this        Follow-up Information     [facility] up.   Specialty: Cardiology Why: 02/04/22 at 10:30 AM  [facility] information: [address] [id number] [place]        Caryl Bis, MD. Schedule an appointment as soon as possible for a visit in 2 week(s).   Specialty: Family Medicine Why: for hospital follow-up Contact information: [place] [address] [phone]                Discharge Exam: Danley Danker Weights   01/26/22 1532  Weight: (!) 191.7 kg   S: No acute complaints,  feeling better, ambulating without any difficulty.  No dizziness or lightheadedness wife at the bedside  Vitals:   01/26/22 2110 01/27/22 0624 01/27/22 0850 01/27/22 0907  BP: 97/64 116/84 107/80 114/79  Pulse: 74 71 78 82  Resp: 18 18    Temp: (!) 97.5 F (36.4 C) (!) 97.5 F (36.4 C)    TempSrc:  Oral    SpO2: 97% 99%    Weight:      Height:        Physical Exam General: Alert and oriented x 3, NAD Cardiovascular: S1 S2 clear, RRR.  Respiratory: CTAB, no wheezing, rales or rhonchi Gastrointestinal: Soft, nontender, nondistended, NBS Ext: no pedal edema bilaterally Neuro: no new deficits Psych: Normal affect and demeanor, alert and oriented x3   Condition at discharge: fair  The results of significant diagnostics from this hospitalization (including imaging, microbiology, ancillary and laboratory) are  listed below for reference.   Imaging Studies: DG Chest Portable 1 View  Result Date: 01/25/2022 CLINICAL DATA:  Near-syncope EXAM: PORTABLE CHEST 1 VIEW COMPARISON:  Radiographs 01/16/2022 FINDINGS: Stable cardiomegaly. No definite pleural effusion, focal consolidation, or pneumothorax. Prominent pulmonary arteries. Left chest wall ICD. IMPRESSION: No acute cardiopulmonary process.  Stable cardiomegaly. Electronically Signed   By: Minerva Fester M.D.   On: 01/25/2022 20:37   VAS [place] LOWER EXTREMITY VENOUS (DVT)  Result Date: 01/17/2022  Lower Venous DVT Study Patient Name:  Dois Juarbe.  Date of Exam:   01/17/2022 Medical Rec #: [id number]             Accession #:    [id number] Date of Birth: 1966/05/03             Patient Gender: M Patient Age:   83 years Exam Location:  [facility] Procedure:      VAS [place] LOWER EXTREMITY VENOUS (DVT) Referring Phys: RENEE URSUY --------------------------------------------------------------------------------  Indications: Hx of DVT.  Comparison Study: 12/28/20 unilateral right negative. Performing Technologist: Magdalene River BS, RVT  Examination Guidelines: A complete evaluation includes B-mode imaging, spectral Doppler, color Doppler, and power Doppler as needed of all accessible portions of each vessel. Bilateral testing is considered an integral part of a complete examination. Limited examinations for reoccurring indications may be performed as noted. The reflux portion of the exam is performed with the patient in reverse Trendelenburg.  +---------+---------------+---------+-----------+----------+--------------+ RIGHT    CompressibilityPhasicitySpontaneityPropertiesThrombus Aging +---------+---------------+---------+-----------+----------+--------------+ CFV      Full           Yes      Yes                                 +---------+---------------+---------+-----------+----------+--------------+ SFJ      Full                                                         +---------+---------------+---------+-----------+----------+--------------+ FV Prox  Full                                                        +---------+---------------+---------+-----------+----------+--------------+ FV Mid   Full                                                        +---------+---------------+---------+-----------+----------+--------------+  FV DistalFull                                                        +---------+---------------+---------+-----------+----------+--------------+ PFV      Full                                                        +---------+---------------+---------+-----------+----------+--------------+ POP      Full           Yes      Yes                                 +---------+---------------+---------+-----------+----------+--------------+ PTV      Full                    Yes                                 +---------+---------------+---------+-----------+----------+--------------+ PERO     Full                    Yes                                 +---------+---------------+---------+-----------+----------+--------------+   +---------+---------------+---------+-----------+----------+--------------+ LEFT     CompressibilityPhasicitySpontaneityPropertiesThrombus Aging +---------+---------------+---------+-----------+----------+--------------+ CFV      Full           Yes      Yes                                 +---------+---------------+---------+-----------+----------+--------------+ SFJ      Full                                                        +---------+---------------+---------+-----------+----------+--------------+ FV Prox  Full                                                        +---------+---------------+---------+-----------+----------+--------------+ FV Mid   Full                                                         +---------+---------------+---------+-----------+----------+--------------+ FV DistalFull                                                        +---------+---------------+---------+-----------+----------+--------------+  PFV      Full                                                        +---------+---------------+---------+-----------+----------+--------------+ POP      Full           Yes      Yes                                 +---------+---------------+---------+-----------+----------+--------------+ PTV      Full                    Yes                                 +---------+---------------+---------+-----------+----------+--------------+ PERO     Full                    Yes                                 +---------+---------------+---------+-----------+----------+--------------+   Left Technical Findings: The   Summary: BILATERAL: - No evidence of deep vein thrombosis seen in the lower extremities, bilaterally. -No evidence of popliteal cyst, bilaterally.   *See table(s) above for measurements and observations. Electronically signed by Servando Snare MD on 01/17/2022 at 5:43:37 PM.    Final    ECHOCARDIOGRAM COMPLETE  Result Date: 01/17/2022    ECHOCARDIOGRAM REPORT   Patient Name:   Tarris Bhat. Date of Exam: 01/17/2022 Medical Rec #:  [id number]            Height:       74.0 in Accession #:    [id number]           Weight:       438.7 lb Date of Birth:  [birth year]            BSA:          3.035 m Patient Age:    12 years             BP:           101/72 mmHg Patient Gender: M                    HR:           71 bpm. Exam Location:  Inpatient Procedure: 2D Echo, Color Doppler, Cardiac Doppler and Intracardiac            Opacification Agent Indications:    I47.2 Ventricular tachycardia  History:        Patient has prior history of Echocardiogram examinations, most                 recent 04/08/2020. CHF, Defibrillator, Pulmonary HTN,                 Arrythmias:LBBB;  Risk Factors:Sleep Apnea.  Sonographer:    Raquel Sarna Senior RDCS Referring Phys: [id number] SCOTT T WEAVER  Sonographer Comments: Technically difficult study due to poor echo windows. Technically difficult due to patient body habitus. IMPRESSIONS  1. Left ventricular ejection fraction, by estimation, is  25 to 30%. The left ventricle has severely decreased function. The left ventricle demonstrates global hypokinesis. The left ventricular internal cavity size was moderately dilated. Left ventricular diastolic parameters are consistent with Grade II diastolic dysfunction (pseudonormalization). Elevated left ventricular end-diastolic pressure. The E/e' is 67.  2. Right ventricular systolic function is low normal. The right ventricular size is normal.  3. The mitral valve is abnormal. Trivial mitral valve regurgitation.  4. The aortic valve was not well visualized. Aortic valve regurgitation is not visualized.  5. The inferior vena cava is normal in size with greater than 50% respiratory variability, suggesting right atrial pressure of 3 mmHg. Comparison(s): Changes from prior study are noted. 04/08/2020: LVEF ~35%. FINDINGS  Left Ventricle: Left ventricular ejection fraction, by estimation, is 25 to 30%. The left ventricle has severely decreased function. The left ventricle demonstrates global hypokinesis. Definity contrast agent was given IV to delineate the left ventricular endocardial borders. The left ventricular internal cavity size was moderately dilated. There is no left ventricular hypertrophy. Left ventricular diastolic parameters are consistent with Grade II diastolic dysfunction (pseudonormalization). Elevated left ventricular end-diastolic pressure. The E/e' is 67. Right Ventricle: The right ventricular size is normal. No increase in right ventricular wall thickness. Right ventricular systolic function is low normal. Left Atrium: Left atrial size was normal in size. Right Atrium: Right atrial size was normal in size.  Pericardium: Trivial pericardial effusion is present. The pericardial effusion is circumferential. Mitral Valve: The mitral valve is abnormal. Mild mitral annular calcification. Trivial mitral valve regurgitation. Tricuspid Valve: The tricuspid valve is grossly normal. Tricuspid valve regurgitation is not demonstrated. Aortic Valve: The aortic valve was not well visualized. Aortic valve regurgitation is not visualized. Pulmonic Valve: The pulmonic valve was normal in structure. Pulmonic valve regurgitation is not visualized. Aorta: The aortic root and ascending aorta are structurally normal, with no evidence of dilitation. Venous: The inferior vena cava is normal in size with greater than 50% respiratory variability, suggesting right atrial pressure of 3 mmHg. IAS/Shunts: No atrial level shunt detected by color flow Doppler. Additional Comments: A device lead is visualized.  LEFT VENTRICLE PLAX 2D LVIDd:         6.50 cm   Diastology LVIDs:         5.60 cm   LV e' medial:    4.24 cm/s LV PW:         1.10 cm   LV E/e' medial:  23.2 LV IVS:        0.90 cm   LV e' lateral:   4.35 cm/s LVOT diam:     2.10 cm   LV E/e' lateral: 22.6 LV SV:         63 LV SV Index:   21 LVOT Area:     3.46 cm  RIGHT VENTRICLE RV S prime:     10.90 cm/s TAPSE (M-mode): 2.0 cm LEFT ATRIUM              Index        RIGHT ATRIUM           Index LA diam:        3.30 cm  1.09 cm/m   RA Area:     20.40 cm LA Vol (A2C):   88.7 ml  29.23 ml/m  RA Volume:   56.60 ml  18.65 ml/m LA Vol (A4C):   99.0 ml  32.62 ml/m LA Biplane Vol: 100.0 ml 32.95 ml/m  AORTIC VALVE LVOT Vmax:   96.60  cm/s LVOT Vmean:  74.700 cm/s LVOT VTI:    0.183 m  AORTA Ao Root diam: 3.70 cm Ao Asc diam:  3.60 cm MITRAL VALVE MV Area (PHT): 3.81 cm    SHUNTS MV Decel Time: 199 msec    Systemic VTI:  0.18 m MV E velocity: 98.50 cm/s  Systemic Diam: 2.10 cm MV A velocity: 66.00 cm/s MV E/A ratio:  1.49 Lyman Bishop MD Electronically signed by Lyman Bishop MD Signature  Date/Time: 01/17/2022/1:57:43 PM    Final    DG Chest Portable 1 View  Result Date: 01/16/2022 CLINICAL DATA:  Pacemaker malfunction. EXAM: PORTABLE CHEST 1 VIEW COMPARISON:  Two-view chest x-ray 01/18/2018 FINDINGS: Heart is enlarged. No edema or effusion is present. Lung volumes are low. No focal airspace disease present. Pacemaker control unit and pacing wires are stable. IMPRESSION: 1. Stable cardiomegaly without failure. 2. No acute cardiopulmonary disease. Electronically Signed   By: San Morelle M.D.   On: 01/16/2022 11:55    Microbiology: Results for orders placed or performed during the hospital encounter of 01/25/22  SARS Coronavirus 2 by RT PCR (hospital order, performed in [facility] hospital lab) *cepheid single result test* Anterior Nasal Swab     Status: None   Collection Time: 01/25/22  6:20 PM   Specimen: Anterior Nasal Swab  Result Value Ref Range Status   SARS Coronavirus 2 by RT PCR NEGATIVE NEGATIVE Final    Comment: (NOTE) SARS-CoV-2 target nucleic acids are NOT DETECTED.  The SARS-CoV-2 RNA is generally detectable in upper and lower respiratory specimens during the acute phase of infection. The lowest concentration of SARS-CoV-2 viral copies this assay can detect is 250 copies / mL. A negative result does not preclude SARS-CoV-2 infection and should not be used as the sole basis for treatment or other patient management decisions.  A negative result may occur with improper specimen collection / handling, submission of specimen other than nasopharyngeal swab, presence of viral mutation(s) within the areas targeted by this assay, and inadequate number of viral copies (<250 copies / mL). A negative result must be combined with clinical observations, patient history, and epidemiological information.  Fact Sheet for Patients:   https://www.patel.info/  Fact Sheet for Healthcare Providers: https://hall.com/  This  test is not yet approved or  cleared by the [place] FDA and has been authorized for detection and/or diagnosis of SARS-CoV-2 by FDA under an Emergency Use Authorization (EUA).  This EUA will remain in effect (meaning this test can be used) for the duration of the COVID-19 declaration under Section 564(b)(1) of the Act, 21 U.S.C. section 360bbb-3(b)(1), unless the authorization is terminated or revoked sooner.  Performed at [facility], [place] [address] [place], [place] [address]     Labs: CBC: Recent Labs  Lab 01/25/22 1709 01/26/22 0850 01/27/22 0505  WBC 10.6* 7.6 7.2  NEUTROABS 6.0  --   --   HGB 18.9* 17.9* 16.8  HCT 55.9* 52.6* 49.9  MCV 98.1 98.5 97.8  PLT 252 180 [id number]   Basic Metabolic Panel: Recent Labs  Lab 01/25/22 1709 01/26/22 0850 01/27/22 0505  NA 138 137 141  K 4.3 4.4 4.9  CL 102 100 108  CO2 24 29 25   GLUCOSE 100* 183* 101*  BUN 24* 24* 22*  CREATININE 2.39* 2.29* 1.98*  CALCIUM 9.5 9.5 8.9  MG 2.5*  --   --    Liver Function Tests: Recent Labs  Lab 01/25/22 1709  AST 26  ALT 31  ALKPHOS 88  BILITOT 1.0  PROT 8.6*  ALBUMIN 4.6   CBG: No results for input(s): "GLUCAP" in the last 168 hours.  Discharge time spent: greater than 30 minutes.  Signed: Thad Ranger, MD [facility] 01/27/2022

## 2022-01-27 NOTE — Progress Notes (Signed)
Patient discharged to home, reviewed medication changes and follow up appointments. Prescriptions sent to the patient's pharmacy. IV removed, tele box returned. Volunteers assisted the patient to the exit, his wife provided transportation.

## 2022-01-27 NOTE — TOC Initial Note (Signed)
Transition of Care (TOC) - Initial/Assessment Note    Patient Details  Name: Mitchell Jennings. MRN: [id number] Date of Birth: 07/01/1965  Transition of Care [facility]) CM/SW Contact:    Tom-Johnson, Hershal Coria, RN Phone Number: 01/27/2022, 11:10 AM  Clinical Narrative:                  CM spoke with patient at bedside about needs for post hospital transition. Admitted for AKI, Dehydration. Was recently admitted for VT storm and ICD shocks 2/2 hypokalemia.  From home with wife. Has two daughters and a supporting brother. Currently employed at [employer]. Does not have DME's at home. PCP is [name], MD and uses [facility] in [place].  No PT/fu noted. Wife to transport at discharge. CM will continue to follow with needs as patient progresses with care.     Expected Discharge Plan: Home/Self Care Barriers to Discharge: Continued Medical Work up   Patient Goals and CMS Choice Patient states their goals for this hospitalization and ongoing recovery are:: To return home CMS Medicare.gov Compare Post Acute Care list provided to:: Patient Choice offered to / list presented to : NA  Expected Discharge Plan and Services Expected Discharge Plan: Home/Self Care   Discharge Planning Services: CM Consult Post Acute Care Choice: NA Living arrangements for the past 2 months: Single Family Home                 DME Arranged: N/A DME Agency: NA       HH Arranged: NA HH Agency: NA        Prior Living Arrangements/Services Living arrangements for the past 2 months: Single Family Home Lives with:: Spouse Patient language and need for interpreter reviewed:: Yes Do you feel safe going back to the place where you live?: Yes      Need for Family Participation in Patient Care: Yes (Comment) Care giver support system in place?: Yes (comment)   Criminal Activity/Legal Involvement Pertinent to Current Situation/Hospitalization: No - Comment as needed  Activities of Daily  Living Home Assistive Devices/Equipment: None ADL Screening (condition at time of admission) Patient's cognitive ability adequate to safely complete daily activities?: Yes Is the patient deaf or have difficulty hearing?: No Does the patient have difficulty seeing, even when wearing glasses/contacts?: No Does the patient have difficulty concentrating, remembering, or making decisions?: No Patient able to express need for assistance with ADLs?: Yes Does the patient have difficulty dressing or bathing?: No Independently performs ADLs?: Yes (appropriate for developmental age) Does the patient have difficulty walking or climbing stairs?: No Weakness of Legs: None Weakness of Arms/Hands: None  Permission Sought/Granted Permission sought to share information with : Case Manager, Family Supports Permission granted to share information with : Yes, Verbal Permission Granted              Emotional Assessment Appearance:: Appears stated age Attitude/Demeanor/Rapport: Engaged, Gracious Affect (typically observed): Accepting, Appropriate, Calm, Hopeful, Pleasant Orientation: : Oriented to Self, Oriented to Place, Oriented to  Time, Oriented to Situation Alcohol / Substance Use: Not Applicable Psych Involvement: No (comment)  Admission diagnosis:  Dehydration [E86.0] AKI (acute kidney injury) (HCC) [N17.9] Near syncope [R55] Patient Active Problem List   Diagnosis Date Noted   Hypotension 01/26/2022   Chronic HFrEF (heart failure with reduced ejection fraction) (HCC) 01/25/2022   AKI (acute kidney injury) (HCC) 01/25/2022   CKD (chronic kidney disease) stage 3, GFR 30-59 ml/min (HCC) 01/25/2022   ICD (implantable cardioverter-defibrillator) discharge  01/16/2022   PAF (paroxysmal atrial fibrillation) (HCC) 01/16/2022   Ventricular tachycardia, sustained (HCC) 01/16/2022   Morbid obesity with BMI of 50.0-59.9, adult (HCC) 01/16/2022   Hypokalemia 01/16/2022   History of DVT (deep vein  thrombosis) 01/16/2022   Sleep apnea 01/16/2022   LBBB (left bundle branch block) 01/16/2022   Ventricular tachycardia (HCC) 01/16/2022   Nonischemic cardiomyopathy (HCC) 01/17/2018   HFrEF (heart failure with reduced ejection fraction) (HCC) 07/31/2017   PCP:  [name], MD Pharmacy:   [facility] - [place], [place] - [address] [place] [address] Phone: [phone] Fax: [phone]  [facility] [address] [place] [address] Phone: [phone] Fax: [phone]     Social Determinants of Health (SDOH) Interventions    Readmission Risk Interventions     No data to display

## 2022-01-27 NOTE — Progress Notes (Signed)
Physical Therapy Treatment and Discharge Patient Details Name: Mitchell Jennings. MRN: [id number] DOB: May 07, 1966 Today's Date: 01/27/2022   History of Present Illness Pt is a 56 y/o male admitted secondary to weakness, fatigue and lightheadedness. Found to have AKI. PMH includes CHF, and pulmonary HTN.    PT Comments    Patient sitting on EOB on arrival. Sitting, standing and after ambulation BPs all assessed. See below and vitals flowsheet. Despite some drop in BP, not technically orthostatic and HR compensating appropriately. After walking, BP had increased above initial level. Pt asymptomatic throughout. No imbalance. No further PT needs.     01/27/22 0901 01/27/22 0907  Vital Signs  Pulse Rate  --  82  BP  --  114/79  BP Location  --  Right Arm  BP Method  --  Automatic  Patient Position (if appropriate)  --  Sitting (after walking)  Orthostatic Sitting  BP- Sitting 106/74  --   Pulse- Sitting 78  --   Orthostatic Standing at 0 minutes  BP- Standing at 0 minutes 91/65  --   Pulse- Standing at 0 minutes 92  --     Recommendations for follow up therapy are one component of a multi-disciplinary discharge planning process, led by the attending physician.  Recommendations may be updated based on patient status, additional functional criteria and insurance authorization.  Follow Up Recommendations  No PT follow up     Assistance Recommended at Discharge None  Patient can return home with the following     Equipment Recommendations  None recommended by PT    Recommendations for Other Services       Precautions / Restrictions Precautions Precautions: None Restrictions Weight Bearing Restrictions: No     Mobility  Bed Mobility Overal bed mobility: Modified Independent                  Transfers Overall transfer level: Independent Equipment used: None Transfers: Sit to/from Stand Sit to Stand: Independent           General transfer comment: no  dizziness    Ambulation/Gait Ambulation/Gait assistance: Independent Gait Distance (Feet): 250 Feet Assistive device: None Gait Pattern/deviations: WFL(Within Functional Limits)       General Gait Details: no dizziness or unsteadiness noted   Stairs             Wheelchair Mobility    Modified Rankin (Stroke Patients Only)       Balance Overall balance assessment: Independent                                          Cognition Arousal/Alertness: Awake/alert Behavior During Therapy: WFL for tasks assessed/performed Overall Cognitive Status: Within Functional Limits for tasks assessed                                          Exercises      General Comments        Pertinent Vitals/Pain Pain Assessment Pain Assessment: No/denies pain    Home Living Family/patient expects to be discharged to:: Private residence Living Arrangements: Spouse/significant other;Children Available Help at Discharge: Family Type of Home: House Home Access: Stairs to enter Entrance Stairs-Rails: Psychiatric nurse of Steps: 3   Home Layout: One level Home Equipment: None  Prior Function            PT Goals (current goals can now be found in the care plan section) Acute Rehab PT Goals Patient Stated Goal: to go home PT Goal Formulation: With patient/family Time For Goal Achievement: 02/09/22 Potential to Achieve Goals: Good Progress towards PT goals: Goals met/education completed, patient discharged from PT    Frequency    Min 3X/week      PT Plan Current plan remains appropriate    Co-evaluation              AM-PAC PT "6 Clicks" Mobility   Outcome Measure  Help needed turning from your back to your side while in a flat bed without using bedrails?: None Help needed moving from lying on your back to sitting on the side of a flat bed without using bedrails?: None Help needed moving to and from a bed  to a chair (including a wheelchair)?: None Help needed standing up from a chair using your arms (e.g., wheelchair or bedside chair)?: None Help needed to walk in hospital room?: None Help needed climbing 3-5 steps with a railing? : None 6 Click Score: 24    End of Session   Activity Tolerance: Patient tolerated treatment well Patient left: in bed;with call bell/phone within reach;with family/visitor present (sitting EOB as on arrival) Nurse Communication: Mobility status;Other (comment) (dc from PT; refer to MT) PT Visit Diagnosis: Other abnormalities of gait and mobility (R26.89);Difficulty in walking, not elsewhere classified (R26.2)     Time: 3159-4585 PT Time Calculation (min) (ACUTE ONLY): 11 min  Charges:  $Gait Training: 8-22 mins                      [place]  Office [phone]    Rexanne Mano 01/27/2022, 9:15 AM

## 2022-01-27 NOTE — Progress Notes (Addendum)
Advanced Heart Failure Rounding Note  PCP-Cardiologist: Dr. Shirlee Latch    Subjective:    Diuretics/ BP active meds remain on hold  AKI improving, SCr 2.39>>2.29>>1.98  K 4.9   No further hypotension. SBPs improving, 110s.   Weakness resolved. No further orthostatic symptoms. Walked the halls earlier today. Felt good. No exertional dyspnea.    Objective:   Weight Range: (!) 191.7 kg Body mass index is 54.26 kg/m.   Vital Signs:   Temp:  [97.4 F (36.3 C)-98.3 F (36.8 C)] 97.5 F (36.4 C) (08/24 0624) Pulse Rate:  [62-82] 82 (08/24 0907) Resp:  [15-23] 18 (08/24 0624) BP: (78-116)/(45-84) 114/79 (08/24 0907) SpO2:  [94 %-100 %] 99 % (08/24 0624) Weight:  [191.7 kg] 191.7 kg (08/23 1532) Last BM Date : 01/25/22  Weight change: Filed Weights   01/26/22 1532  Weight: (!) 191.7 kg    Intake/Output:   Intake/Output Summary (Last 24 hours) at 01/27/2022 0954 Last data filed at 01/27/2022 0800 Gross per 24 hour  Intake 887.44 ml  Output 600 ml  Net 287.44 ml      Physical Exam    General: morbidly obese. No resp difficulty HEENT: Normal Neck: Supple. Thick neck, JVD not well visualized . Carotids 2+ bilat; no bruits. No lymphadenopathy or thyromegaly appreciated. Cor: PMI nondisplaced. Regular rate & rhythm. No rubs, gallops or murmurs. Lungs: Clear Abdomen: obese, soft, nontender, nondistended. No hepatosplenomegaly. No bruits or masses. Good bowel sounds. Extremities: No cyanosis, clubbing, rash, no edema Neuro: Alert & orientedx3, cranial nerves grossly intact. moves all 4 extremities w/o difficulty. Affect pleasant   Telemetry   NSR 80s w/ occasional PVCs   EKG    N/A   Labs    CBC Recent Labs    01/25/22 1709 01/26/22 0850 01/27/22 0505  WBC 10.6* 7.6 7.2  NEUTROABS 6.0  --   --   HGB 18.9* 17.9* 16.8  HCT 55.9* 52.6* 49.9  MCV 98.1 98.5 97.8  PLT 252 180 173   Basic Metabolic Panel Recent Labs    78/58/85 1709 01/26/22 0850  01/27/22 0505  NA 138 137 141  K 4.3 4.4 4.9  CL 102 100 108  CO2 24 29 25   GLUCOSE 100* 183* 101*  BUN 24* 24* 22*  CREATININE 2.39* 2.29* 1.98*  CALCIUM 9.5 9.5 8.9  MG 2.5*  --   --    Liver Function Tests Recent Labs    01/25/22 1709  AST 26  ALT 31  ALKPHOS 88  BILITOT 1.0  PROT 8.6*  ALBUMIN 4.6   No results for input(s): "LIPASE", "AMYLASE" in the last 72 hours. Cardiac Enzymes Recent Labs    01/25/22 1709  CKTOTAL 73    BNP: BNP (last 3 results) Recent Labs    09/10/21 1020 01/25/22 1709  BNP 39.7 11.3    ProBNP (last 3 results) No results for input(s): "PROBNP" in the last 8760 hours.   D-Dimer No results for input(s): "DDIMER" in the last 72 hours. Hemoglobin A1C No results for input(s): "HGBA1C" in the last 72 hours. Fasting Lipid Panel No results for input(s): "CHOL", "HDL", "LDLCALC", "TRIG", "CHOLHDL", "LDLDIRECT" in the last 72 hours. Thyroid Function Tests No results for input(s): "TSH", "T4TOTAL", "T3FREE", "THYROIDAB" in the last 72 hours.  Invalid input(s): "FREET3"  Other results:   Imaging    No results found.   Medications:     Scheduled Medications:  allopurinol  300 mg Oral Daily   amiodarone  400 mg  Oral BID   multivitamin with minerals  1 tablet Oral Daily   rivaroxaban  20 mg Oral Q supper   rosuvastatin  5 mg Oral Daily    Infusions:  sodium chloride 75 mL/hr at 01/27/22 0050    PRN Medications: acetaminophen **OR** acetaminophen, ondansetron **OR** ondansetron (ZOFRAN) IV    Patient Profile   56 y/o male w/ super morbid obesity, s/p bariatric surgery, chronic systolic heart failure due to NICM, h/o VT s/p ICD, OSA, CKD Stage III, AAA, DVT RLE and gout. Recent admit earlier this month for VT storm and ICD shocks, in setting of hypokalemia. Spironolactone dose ultimately increased to 50 mg daily. Discharged home on PO amio.   Now readmitted for AKI and hypotension, presumed  dehydration.  Assessment/Plan   AKI on Stage III CKD - Scr 2.39 on admit (up from 1.54 previous admit) - suspect prerenal, due to dehydration from overdiuresis/ hypotension  - he has known low EF but exam not concerning for low output. CO2 29. LFTs nl  - SCr starting to trend down, 2.39>2.29>1.98 today  - continue to hold BP active meds and diuretics for now. Plan to restart meds tomorrow (see problem #3)  - follow BMP    2. Hypotension  - suspect 2/2 overdiuresis +/- polypharmacy. On multiple BP active meds w/ recent dose titration of spironolactone - hold diuretics and BP active meds for now  - BP improved w/ IVF hydration  - once AKI resolved and able to resume GDMT, suspect he will need lower dose Entresto (24-26 mg bid). See below    3. Chronic Systolic Heart Failure - NICM. Has ICD - Most Recent Echo 7/23 EF 25-30% (35% prior study), RV low normal. IVC not dilated - NYHC Class II-III chronically, confounded by super morbid obesity and deconditioning  - volume assessment by exam difficult given obesity/body habitus, but suspect he is dry given labs (AKI + high hgb) - Holding diuretics and GDMT for now given AKI and hypotension (improving)  - Plan to hold meds x 1 more day  - Restart Farxiga 10 mg daily + Spironolactone 25 mg daily tomorrow - Restart Entresto tomorrow at lower dose, 24-26 mg bid - Switch torsemide from daily to PRN   4. H/o VT - in setting of NICM, has ICD - recent admit earlier this month for VT storm + ICD shocks x 3, in setting of hypokalemia - denies palpitations. No further ICD shocks - No recurrent episodes of VT on admission device interrogation  - continue PO amiodarone 400 mg bid. Per EP will need 400 mg bid x 2 weeks (reduce to 200 mg bid starting 8/30 x 2 wks>>200 mg once daily thereafter) - monitor K closely, currently stable 4.9. Mg stable 2.5    5. PAF - in SR - continue Amiodarone and Xarelto   6. H/o LE DVT - continue Xarelto    7.  OSA - CPAP QHS   Can probably go home later today. Dr. Shirlee Latch to see. Final med recs pending. Will need outpatient BMP early next week in the [facility] (Monday, 8/28)  Length of Stay: 0  Knute Neu  01/27/2022, 9:54 AM  Advanced Heart Failure Team Pager [phone] (M-F; 7a - 5p)  Please contact [facility] for night-coverage after hours (5p -7a ) and weekends on [email or web address]  Patient seen with PA, agree with the above note.   Creatinine coming down to 1.98, SBP 100s generally.  No complaints.   General: NAD, obese.  Neck: No JVD, no thyromegaly or thyroid nodule.  Lungs: Clear to auscultation bilaterally with normal respiratory effort. CV: Nondisplaced PMI.  Heart regular S1/S2, no S3/S4, no murmur.  No peripheral edema.   Abdomen: Soft, nontender, no hepatosplenomegaly, no distention.  Skin: Intact without lesions or rashes.  Neurologic: Alert and oriented x 3.  Psych: Normal affect. Extremities: No clubbing or cyanosis.  HEENT: Normal.   Agree with holding BP-active meds for 1 more day then restarting the following meds tomorrow: spironolactone 25 mg daily, Farxiga 10 mg daily, Entresto 24/26 bid (lower dose Entresto and spironolactone).  Torsemide should be changed to prn.    He is eating/drinking normally and no longer lightheaded with standing, can stop IV fluid.   Close followup in CHF clinic.  Could go home today with above instructions.   Loralie Champagne 01/27/2022 1:04 PM

## 2022-02-04 ENCOUNTER — Ambulatory Visit (HOSPITAL_COMMUNITY)
Admit: 2022-02-04 | Discharge: 2022-02-04 | Disposition: A | Discharge status: Home / Self Care | Payer: Managed Care, Other (non HMO) | Attending: Family Medicine | Admitting: Family Medicine

## 2022-02-04 ENCOUNTER — Encounter (HOSPITAL_COMMUNITY): Payer: Self-pay

## 2022-02-04 VITALS — BP 130/80 | HR 68 | Wt >= 6400 oz

## 2022-02-04 DIAGNOSIS — Z86718 Personal history of other venous thrombosis and embolism: Secondary | ICD-10-CM | POA: Insufficient documentation

## 2022-02-04 DIAGNOSIS — I472 Ventricular tachycardia, unspecified: Secondary | ICD-10-CM

## 2022-02-04 DIAGNOSIS — Z7901 Long term (current) use of anticoagulants: Secondary | ICD-10-CM | POA: Insufficient documentation

## 2022-02-04 DIAGNOSIS — Z8249 Family history of ischemic heart disease and other diseases of the circulatory system: Secondary | ICD-10-CM | POA: Insufficient documentation

## 2022-02-04 DIAGNOSIS — I480 Paroxysmal atrial fibrillation: Secondary | ICD-10-CM | POA: Diagnosis not present

## 2022-02-04 DIAGNOSIS — G4733 Obstructive sleep apnea (adult) (pediatric): Secondary | ICD-10-CM | POA: Diagnosis not present

## 2022-02-04 DIAGNOSIS — Z79899 Other long term (current) drug therapy: Secondary | ICD-10-CM | POA: Diagnosis not present

## 2022-02-04 DIAGNOSIS — I5022 Chronic systolic (congestive) heart failure: Secondary | ICD-10-CM | POA: Insufficient documentation

## 2022-02-04 DIAGNOSIS — N183 Chronic kidney disease, stage 3 unspecified: Secondary | ICD-10-CM | POA: Insufficient documentation

## 2022-02-04 DIAGNOSIS — Z6841 Body Mass Index (BMI) 40.0 and over, adult: Secondary | ICD-10-CM | POA: Diagnosis not present

## 2022-02-04 DIAGNOSIS — Z9989 Dependence on other enabling machines and devices: Secondary | ICD-10-CM

## 2022-02-04 DIAGNOSIS — E8770 Fluid overload, unspecified: Secondary | ICD-10-CM | POA: Diagnosis not present

## 2022-02-04 DIAGNOSIS — Z95810 Presence of automatic (implantable) cardiac defibrillator: Secondary | ICD-10-CM | POA: Insufficient documentation

## 2022-02-04 DIAGNOSIS — I428 Other cardiomyopathies: Secondary | ICD-10-CM | POA: Diagnosis not present

## 2022-02-04 DIAGNOSIS — I4891 Unspecified atrial fibrillation: Secondary | ICD-10-CM

## 2022-02-04 DIAGNOSIS — Z7984 Long term (current) use of oral hypoglycemic drugs: Secondary | ICD-10-CM | POA: Insufficient documentation

## 2022-02-04 MED ORDER — TORSEMIDE 20 MG PO TABS: 20.0000 mg | ORAL_TABLET | ORAL | 3 refills | Status: DC

## 2022-02-04 MED ORDER — POTASSIUM CHLORIDE CRYS ER 20 MEQ PO TBCR: 20.0000 meq | EXTENDED_RELEASE_TABLET | ORAL | 3 refills | Status: DC

## 2022-02-04 NOTE — Patient Instructions (Addendum)
EKG done today.  No Labs done today.   RESTART Torsemide 20mg  (1 tablet) by mouth every other day.  START Potassium 69meq(1 tablet) by mouth every other day.   No other medication changes were made. Please continue all current medications as prescribed.  Your physician recommends that you schedule a follow-up appointment in: 10 days for a lab only appointment(can be done at EP appointment) 3-4 months with Dr. 30m. Please contact our office in December to schedule a January 2024 appointment.   If you have any questions or concerns before your next appointment please send February 2024 a message through [facility] or call our office at [phone].    TO LEAVE A MESSAGE FOR THE NURSE SELECT OPTION 2, PLEASE LEAVE A MESSAGE INCLUDING: YOUR NAME DATE OF BIRTH CALL BACK NUMBER REASON FOR CALL**this is important as we prioritize the call backs  YOU WILL RECEIVE A CALL BACK THE SAME DAY AS LONG AS YOU CALL BEFORE 4:00 PM   Do the following things EVERYDAY: Weigh yourself in the morning before breakfast. Write it down and keep it in a log. Take your medicines as prescribed Eat low salt foods--Limit salt (sodium) to 2000 mg per day.  Stay as active as you can everyday Limit all fluids for the day to less than 2 liters   At the [facility], you and your health needs are our priority. As part of our continuing mission to provide you with exceptional heart care, we have created designated Provider Care Teams. These Care Teams include your primary Cardiologist (physician) and Advanced Practice Providers (APPs- Physician Assistants and Nurse Practitioners) who all work together to provide you with the care you need, when you need it.   You may see any of the following providers on your designated Care Team at your next follow up: Dr [phone] Dr Arvilla Meres, NP Carron Curie, Robbie Lis [place], PharmD   Please be sure to bring in all your medications bottles to  every appointment.

## 2022-02-04 NOTE — Progress Notes (Signed)
PCP: Dr. Reuel Boom Cardiology: Dr. Wyline Mood HF Cardiology: Dr. Shirlee Latch  56 y.o. with history of nonischemic cardiomyopathy was referred by Dr. Wyline Mood for evaluation of CHF.  Patient had no cardiac history prior to 1/19.  However, starting at about 11/18, he began to develop profound exertional dyspnea.  This happened after a flu-like illness.  This was gradually progressive, and he ended up at [facility] in 1/19.  Echo showed EF 20-25% with some RV failure.  He was diuresed extensively and sent to followup with Dr. Wyline Mood.  In 2/19, he had a right heart cath showing no significant coronary disease but elevated left and right-sided filling pressures and pulmonary venous hypertension were noted on RHC.  Cardiac index was low.    Echo in 7/19 showed EF 30% with diffuse hypokinesis and normal RV.  He had Medtronic ICD placed in 8/19.  Echo in 11/21 showed EF 35%, moderate-severe LV dilation, normal RV.   Admitted 8/13-8/16 after ICD shock at home, in the setting of hypokalemia.  ICD fired x3. Admitted with VT storm. Device interrogation confirmed - appropriate shock x 3 for VT, ATP x 1 --> ? A fib. CXR ok. Optivol not suggestive of fluid overload. Started on amiodarone drip. EP consulted. Xarelto started given evidence of Afib on device interrogation. Echo showed EF 25-30% (35% in past). Given absence of CP and relatively recent cath in 2019 showing no CAD, repeat cardiac cath was felt not indicated. Recent admit earlier this month for VT storm and ICD shocks, in setting of hypokalemia. Spironolactone dose ultimately increased to 50 mg daily. Discharged home on PO amio.   Readmitted 01/26/22 for AKI and hypotension, presumed dehydration. GDMT held then eventually restarted at lower dose. Discharged home, weight 422 lbs.  Today he returns for post hospital HF follow up with his wife. Overall feeling fine. He does not have dyspnea with ADLs or walking short distances on flat ground. Denies palpitations, CP,  dizziness, edema, or PND/Orthopnea. Appetite ok. No fever or chills. Weight at home 430 pounds. Taking all medications. Wears CPAP at night.  Medtronic device interrogation: OptiVol up, thoracic impedence down, 1 hr/day activity, no further VT  (personally reviewed).    ECG (personally reviewed): NSR 68 bpm, LBBB QRS 140 msec.  Labs (2/19): creatinine 1.55 Labs (3/19): TSH normal, K 3.6, creatinine 1.7 Labs (4/19): K 3.4, creatinine 1.67, LDL 149, selenium 144 (normal)  Labs (5/19): K 3.6, creatinine 1.68 Labs (8/19): K 3.6, creatinine 1.72, hgb 14.7 Labs (10/19): K 4.1, creatinine 1.6 Labs (12/19): K 4.1, creatinine 1.6 Labs (11/21): K 3.2, creatinine 1.54 Labs (3/22): K 2.7, creatinine 1.58 Labs (8/23): K 4.1, creatinine 1.77  PMH: 1. Obesity: h/o bariatric surgery (gastric sleeve).  2. OSA: Uses CPAP.  3. CKD: stage 3.  4. AAA: Abdominal US in 3/19 showed 3.4 cm AAA.  5. Chronic systolic CHF: Nonischemic cardiomyopathy.  First noted in 1/19.  - Echo (1/19, [place]): EF 20-25%, moderate to severe LV dilation, moderate to severe RV dilation with moderately decreased RV systolic function, mild to moderate MR, PASP 57 mmHg.  - RHC/LHC (1/19): No significant CAD.  Mean RA 12, PA 80/30 mean 47, mean PCWP 35, CI 1.7, PVR 2.4 WU.  - CPX (4/19): peak VO2 16.7, VE/VCO2 slope 26, RER 1.14 - Echo (7/19): EF 30% with diffuse hypokinesis, moderate diastolic dysfunction, normal RV size and systolic function.  - Medtronic ICD - Echo (11/21): EF 35%, moderate-severe LV dilation, normal RV. - Echo (8/23): EF 25-30% (35%  prior study), RV low normal. IVC not dilated 6. DVT right leg 7. Gout  FH: Father with MI, uncle with CABG, uncle with MI, mother with MI and CHF.   SH: Married, lives in [place], works for a trucking company, no smoking, no ETOH/drugs.   ROS: All systems reviewed and negative except as per HPI.   Current Outpatient Medications  Medication Sig Dispense Refill   allopurinol  (ZYLOPRIM) 300 MG tablet Take 300 mg by mouth daily.     amiodarone (PACERONE) 200 MG tablet Take 1 tablet (200 mg total) by mouth 2 (two) times daily. X 10 days, then taper down to to 200 mg PO daily 120 tablet 1   dapagliflozin propanediol (FARXIGA) 10 MG TABS tablet Take 1 tablet (10 mg total) by mouth daily before breakfast. 30 tablet 4   metoprolol succinate (TOPROL-XL) 100 MG 24 hr tablet Take 1 tablet (100 mg total) by mouth 2 (two) times daily. 60 tablet 6   Multiple Vitamins-Minerals (CENTRUM MEN) TABS Take 1 tablet by mouth daily.     OZEMPIC, 1 MG/DOSE, 4 MG/3ML SOPN Inject 1 mg into the skin once a week.     rivaroxaban (XARELTO) 20 MG TABS tablet Take 1 tablet (20 mg total) by mouth daily with supper. 30 tablet 11   rosuvastatin (CRESTOR) 5 MG tablet Take 1 tablet (5 mg total) by mouth daily. 90 tablet 2   sacubitril-valsartan (ENTRESTO) 24-26 MG Take 1 tablet by mouth 2 (two) times daily. 60 tablet 3   spironolactone (ALDACTONE) 25 MG tablet Take 1 tablet (25 mg total) by mouth daily. 30 tablet 11   torsemide (DEMADEX) 20 MG tablet Take 1 tablet (20 mg total) by mouth daily as needed (Fluid overload, edema). 30 tablet 11   No current facility-administered medications for this encounter.   Wt Readings from Last 3 Encounters:  02/04/22 (!) 199.1 kg (439 lb)  01/26/22 (!) 191.7 kg (422 lb 10 oz)  01/19/22 (!) 195 kg (430 lb)   BP 130/80   Pulse 68   Wt (!) 199.1 kg (439 lb)   SpO2 98%   BMI 56.36 kg/m  Physical Exam: General:  NAD. No resp difficulty HEENT: Normal Neck: Supple. Thick neck. Carotids 2+ bilat; no bruits. No lymphadenopathy or thryomegaly appreciated. Cor: PMI nondisplaced. Regular rate & rhythm. No rubs, gallops or murmurs. Lungs: Clear Abdomen: Obese, soft, nontender, nondistended. No hepatosplenomegaly. No bruits or masses. Good bowel sounds. Extremities: No cyanosis, clubbing, rash, edema, chronic venous stasis changes to BLE, trace edema R>L Neuro: Alert  & oriented x 3, cranial nerves grossly intact. Moves all 4 extremities w/o difficulty. Affect pleasant.  Assessment/Plan 1. Chronic Systolic Heart Failure: NICM. Has ICD. Echo 7/23 EF 25-30% (35% prior study), RV low normal. IVC not dilated. Chronically NYHC Class II-III, confounded by super morbid obesity and deconditioning. He is volume overloaded today by OptiVol, weight up from discharge. - Restart torsemide 20 mg QOD + 20 KCL QOD. BMET/BNP today, repeat BMET in 1 week. - Continue Toprol 100 mg bid. - Continue Farxiga 10 mg daily.   - Continue spironolactone 25 mg daily. - Continue Entresto 24/26 mg bid. 2. H/o VT: in setting of NICM, has ICD. Recent admit earlier this month for VT storm + ICD shocks x 3, in setting of hypokalemia. Denies palpitations. No further ICD shocks or episodes of VT on device interrogation. - Continue amiodarone taper.  - He has follow up with EP. Check Mag today. 3. Stage III CKD:  BMET today. 4. PAF: NSR on ECG today. - Continue Xarelto. 5. H/o LE DVT: On Xarelto. 6. OSA: Complaint with CPAP 7. Obesity: Body mass index is 56.36 kg/m. - Continue semaglutide.  Follow up in 3-4 months with Dr. Aundra Dubin.  Allena Katz, FNP-BC 02/04/22

## 2022-02-11 ENCOUNTER — Encounter: Payer: Self-pay | Admitting: Cardiology

## 2022-02-16 ENCOUNTER — Encounter: Payer: Managed Care, Other (non HMO) | Admitting: Student

## 2022-02-16 NOTE — Progress Notes (Signed)
Electrophysiology Office Note Date: 02/16/2022  ID:  Mitchell Limes., DOB [birth year], MRN [id number]  PCP: Mitchell Chimera, MD Primary Cardiologist: None Electrophysiologist: Mitchell Jorja Loa, MD   CC: Routine ICD follow-up  Mitchell Chang Bardia Wangerin. is a 56 y.o. male seen today for Mitchell Jorja Loa, MD for post hospital follow up.    Admitted 8/13 - 8/16 with VT storm, loaded on amiodarone.  Admitted again 8/22-8/24 with AKI and hypotension.   Since discharge from hospital the patient reports doing very well. Took an extra torsemide Saturday, but otherwise has been stable. Denies dyspnea with ADLs or short walks on flat ground. he denies chest pain, palpitations, dyspnea, PND, orthopnea, nausea, vomiting, dizziness, syncope, edema, weight gain, or early satiety.  Wears CPAP all night, every night.   Device History: MDT dual chamber ICD, implanted 01/17/18  Past Medical History:  Diagnosis Date   CHF (congestive heart failure) (HCC)    Dilated cardiomyopathy (HCC)    Pulmonary hypertension (HCC)    Past Surgical History:  Procedure Laterality Date   BARIATRIC SURGERY     ICD IMPLANT N/A 01/17/2018   Procedure: ICD IMPLANT;  Surgeon: Regan Lemming, MD;  Location: [facility];  Service: Cardiovascular;  Laterality: N/A;   RIGHT/LEFT HEART CATH AND CORONARY ANGIOGRAPHY N/A 07/31/2017   Procedure: RIGHT/LEFT HEART CATH AND CORONARY ANGIOGRAPHY;  Surgeon: Yvonne Kendall, MD;  Location: [facility] INV[facility] CV [facility];  Service: Cardiovascular;  Laterality: N/A;   UMBILICAL HERNIA REPAIR      Current Outpatient Medications  Medication Sig Dispense Refill   allopurinol (ZYLOPRIM) 300 MG tablet Take 300 mg by mouth daily.     amiodarone (PACERONE) 200 MG tablet Take 1 tablet (200 mg total) by mouth 2 (two) times daily. X 10 days, then taper down to to 200 mg PO daily 120 tablet 1   dapagliflozin propanediol (FARXIGA) 10 MG TABS tablet Take 1 tablet (10 mg total) by mouth  daily before breakfast. 30 tablet 4   metoprolol succinate (TOPROL-XL) 100 MG 24 hr tablet Take 1 tablet (100 mg total) by mouth 2 (two) times daily. 60 tablet 6   Multiple Vitamins-Minerals (CENTRUM MEN) TABS Take 1 tablet by mouth daily.     OZEMPIC, 1 MG/DOSE, 4 MG/3ML SOPN Inject 1 mg into the skin once a week.     potassium chloride SA (KLOR-CON M20) 20 MEQ tablet Take 1 tablet (20 mEq total) by mouth every other day. 45 tablet 3   rivaroxaban (XARELTO) 20 MG TABS tablet Take 1 tablet (20 mg total) by mouth daily with supper. 30 tablet 11   rosuvastatin (CRESTOR) 5 MG tablet Take 1 tablet (5 mg total) by mouth daily. 90 tablet 2   sacubitril-valsartan (ENTRESTO) 24-26 MG Take 1 tablet by mouth 2 (two) times daily. 60 tablet 3   spironolactone (ALDACTONE) 25 MG tablet Take 1 tablet (25 mg total) by mouth daily. 30 tablet 11   torsemide (DEMADEX) 20 MG tablet Take 1 tablet (20 mg total) by mouth every other day. 45 tablet 3   No current facility-administered medications for this visit.    Allergies:   Patient has no known allergies.   Social History: Social History   Socioeconomic History   Marital status: Married    Spouse name: Not on file   Number of children: Not on file   Years of education: Not on file   Highest education level: Not on file  Occupational History  Not on file  Tobacco Use   Smoking status: Never   Smokeless tobacco: Never  Vaping Use   Vaping Use: Never used  Substance and Sexual Activity   Alcohol use: No   Drug use: No   Sexual activity: Not on file  Other Topics Concern   Not on file  Social History Narrative   Not on file   Social Determinants of Health   Financial Resource Strain: Not on file  Food Insecurity: Not on file  Transportation Needs: Not on file  Physical Activity: Not on file  Stress: Not on file  Social Connections: Not on file  Intimate Partner Violence: Not on file    Family History: Family History  Problem Relation  Age of Onset   Hypertension Mother    Lung cancer Mother    Heart failure Mother    Diabetes type II Father    Diabetes type II Brother     Review of Systems: All other systems reviewed and are otherwise negative except as noted above.   Physical Exam: There were no vitals filed for this visit.   GEN- The patient is well appearing, alert and oriented x 3 today.   HEENT: normocephalic, atraumatic; sclera clear, conjunctiva pink; hearing intact; oropharynx clear; neck supple, no JVP Lymph- no cervical lymphadenopathy Lungs- Clear to ausculation bilaterally, normal work of breathing.  No wheezes, rales, rhonchi Heart- Regular  rate and rhythm, no murmurs, rubs or gallops, PMI not laterally displaced GI- soft, non-tender, non-distended, bowel sounds present, no hepatosplenomegaly Extremities- no clubbing or cyanosis. No peripheral edema; DP/PT/radial pulses 2+ bilaterally MS- no significant deformity or atrophy Skin- warm and dry, no rash or lesion; ICD pocket well healed Psych- euthymic mood, full affect Neuro- strength and sensation are intact  ICD interrogation- reviewed in detail today,  See PACEART report  EKG:  EKG is not ordered today. Personal review of EKG ordered 02/04/2022 shows NSR at 68 bpm  Recent Labs: 01/16/2022: TSH 0.550 01/25/2022: ALT 31; B Natriuretic Peptide 11.3; Magnesium 2.5 01/27/2022: BUN 22; Creatinine, Ser 1.98; Hemoglobin 16.8; Platelets 173; Potassium 4.9; Sodium 141   Wt Readings from Last 3 Encounters:  02/04/22 (!) 439 lb (199.1 kg)  01/26/22 (!) 422 lb 10 oz (191.7 kg)  01/19/22 (!) 430 lb (195 kg)     Other studies Reviewed: Additional studies/ records that were reviewed today include: Previous EP office notes.   Assessment and Plan:  1.  Chronic systolic dysfunction s/p Medtronic dual chamber ICD  2. NICM Echo 01/17/2022 LVEF 25-30% euvolemic today, though exam difficult. Optivol at borderline of threshold.  Stable on an appropriate  medical regimen Normal ICD function See Pace Art report No changes today  3. VT storm Quiescent post admission Continue amiodarone 200 mg daily Labs today.  Reviewed driving restrictions  Current medicines are reviewed at length with the patient today.    Labs/ tests ordered today include:  Orders Placed This Encounter  Procedures   Comprehensive metabolic panel   TSH   T4, free    Disposition:   Follow up with Dr. Elberta Fortis in 3 months    Signed, Graciella Freer, PA-C  02/16/2022 12:53 PM  [facility] [address] [place] [address] [phone] (office) [phone] (fax)

## 2022-02-17 ENCOUNTER — Encounter: Payer: Self-pay | Admitting: Student

## 2022-02-17 ENCOUNTER — Ambulatory Visit: Payer: Managed Care, Other (non HMO) | Attending: Student | Admitting: Student

## 2022-02-17 VITALS — BP 112/62 | HR 72 | Ht 74.0 in | Wt >= 6400 oz

## 2022-02-17 DIAGNOSIS — I5022 Chronic systolic (congestive) heart failure: Secondary | ICD-10-CM

## 2022-02-17 DIAGNOSIS — I472 Ventricular tachycardia, unspecified: Secondary | ICD-10-CM

## 2022-02-17 LAB — CUP PACEART INCLINIC DEVICE CHECK
Battery Remaining Longevity: 35 mo
Battery Voltage: 2.96 V
Brady Statistic AP VP Percent: 0 %
Brady Statistic AP VS Percent: 0.05 %
Brady Statistic AS VP Percent: 0.06 %
Brady Statistic AS VS Percent: 99.88 %
Brady Statistic RA Percent Paced: 0.06 %
Brady Statistic RV Percent Paced: 0.06 %
Date Time Interrogation Session: 20230914100527
HighPow Impedance: 90 Ohm
Implantable Lead Implant Date: 20190814
Implantable Lead Implant Date: 20190814
Implantable Lead Location: 753859
Implantable Lead Location: 753860
Implantable Lead Model: 5076
Implantable Pulse Generator Implant Date: 20190814
Lead Channel Impedance Value: 380 Ohm
Lead Channel Impedance Value: 456 Ohm
Lead Channel Impedance Value: 589 Ohm
Lead Channel Pacing Threshold Amplitude: 0.875 V
Lead Channel Pacing Threshold Amplitude: 1.125 V
Lead Channel Pacing Threshold Pulse Width: 0.4 ms
Lead Channel Pacing Threshold Pulse Width: 0.4 ms
Lead Channel Sensing Intrinsic Amplitude: 16.25 mV
Lead Channel Sensing Intrinsic Amplitude: 17 mV
Lead Channel Sensing Intrinsic Amplitude: 3.5 mV
Lead Channel Sensing Intrinsic Amplitude: 5.375 mV
Lead Channel Setting Pacing Amplitude: 2.25 V
Lead Channel Setting Pacing Amplitude: 2.5 V
Lead Channel Setting Pacing Pulse Width: 0.4 ms
Lead Channel Setting Sensing Sensitivity: 0.3 mV

## 2022-02-17 LAB — COMPREHENSIVE METABOLIC PANEL
ALT: 22 IU/L (ref 0–44)
AST: 20 IU/L (ref 0–40)
Albumin/Globulin Ratio: 1.6 (ref 1.2–2.2)
Albumin: 4.1 g/dL (ref 3.8–4.9)
Alkaline Phosphatase: 84 IU/L (ref 44–121)
BUN/Creatinine Ratio: 9 (ref 9–20)
BUN: 17 mg/dL (ref 6–24)
Bilirubin Total: 0.7 mg/dL (ref 0.0–1.2)
CO2: 26 mmol/L (ref 20–29)
Calcium: 9.3 mg/dL (ref 8.7–10.2)
Chloride: 106 mmol/L (ref 96–106)
Creatinine, Ser: 1.84 mg/dL — ABNORMAL HIGH (ref 0.76–1.27)
Globulin, Total: 2.5 g/dL (ref 1.5–4.5)
Glucose: 112 mg/dL — ABNORMAL HIGH (ref 70–99)
Potassium: 4.2 mmol/L (ref 3.5–5.2)
Sodium: 145 mmol/L — ABNORMAL HIGH (ref 134–144)
Total Protein: 6.6 g/dL (ref 6.0–8.5)
eGFR: 43 mL/min/{1.73_m2} — ABNORMAL LOW (ref >59)

## 2022-02-17 LAB — T4, FREE: Free T4: 1 ng/dL (ref 0.82–1.77)

## 2022-02-17 LAB — TSH: TSH: 2.3 u[IU]/mL (ref 0.450–4.500)

## 2022-02-17 NOTE — Patient Instructions (Signed)
Medication Instructions:  Your physician recommends that you continue on your current medications as directed. Please refer to the Current Medication list given to you today.  *If you need a refill on your cardiac medications before your next appointment, please call your pharmacy*   Lab Work: TODAY: CMET, TSH, FreeT4  If you have labs (blood work) drawn today and your tests are completely normal, you will receive your results only by: MyChart Message (if you have MyChart) OR A paper copy in the mail If you have any lab test that is abnormal or we need to change your treatment, we will call you to review the results.   Follow-Up: At [facility], you and your health needs are our priority.  As part of our continuing mission to provide you with exceptional heart care, we have created designated Provider Care Teams.  These Care Teams include your primary Cardiologist (physician) and Advanced Practice Providers (APPs -  Physician Assistants and Nurse Practitioners) who all work together to provide you with the care you need, when you need it.   Your next appointment:   3 month(s)  The format for your next appointment:   In Person  Provider:   Loman Brooklyn, MD

## 2022-02-23 ENCOUNTER — Ambulatory Visit (INDEPENDENT_AMBULATORY_CARE_PROVIDER_SITE_OTHER): Payer: Managed Care, Other (non HMO)

## 2022-02-23 DIAGNOSIS — I428 Other cardiomyopathies: Secondary | ICD-10-CM

## 2022-02-23 LAB — CUP PACEART REMOTE DEVICE CHECK
Battery Remaining Longevity: 35 mo
Battery Voltage: 2.96 V
Brady Statistic AP VP Percent: 0 %
Brady Statistic AP VS Percent: 0.05 %
Brady Statistic AS VP Percent: 0.07 %
Brady Statistic AS VS Percent: 99.87 %
Brady Statistic RA Percent Paced: 0.05 %
Brady Statistic RV Percent Paced: 0.08 %
Date Time Interrogation Session: 20230920022825
HighPow Impedance: 97 Ohm
Implantable Lead Implant Date: 20190814
Implantable Lead Implant Date: 20190814
Implantable Lead Location: 753859
Implantable Lead Location: 753860
Implantable Lead Model: 5076
Implantable Pulse Generator Implant Date: 20190814
Lead Channel Impedance Value: 380 Ohm
Lead Channel Impedance Value: 456 Ohm
Lead Channel Impedance Value: 589 Ohm
Lead Channel Pacing Threshold Amplitude: 0.875 V
Lead Channel Pacing Threshold Amplitude: 1.125 V
Lead Channel Pacing Threshold Pulse Width: 0.4 ms
Lead Channel Pacing Threshold Pulse Width: 0.4 ms
Lead Channel Sensing Intrinsic Amplitude: 15.75 mV
Lead Channel Sensing Intrinsic Amplitude: 15.75 mV
Lead Channel Sensing Intrinsic Amplitude: 3.75 mV
Lead Channel Sensing Intrinsic Amplitude: 3.75 mV
Lead Channel Setting Pacing Amplitude: 2.25 V
Lead Channel Setting Pacing Amplitude: 2.5 V
Lead Channel Setting Pacing Pulse Width: 0.4 ms
Lead Channel Setting Sensing Sensitivity: 0.3 mV

## 2022-03-08 NOTE — Progress Notes (Signed)
Remote ICD transmission.   

## 2022-03-14 ENCOUNTER — Other Ambulatory Visit (HOSPITAL_COMMUNITY): Payer: Self-pay | Admitting: Family Medicine

## 2022-05-04 ENCOUNTER — Other Ambulatory Visit (HOSPITAL_COMMUNITY): Payer: Self-pay

## 2022-05-05 ENCOUNTER — Other Ambulatory Visit (HOSPITAL_COMMUNITY): Payer: Self-pay

## 2022-05-16 ENCOUNTER — Encounter: Payer: Self-pay | Admitting: Cardiology

## 2022-05-16 ENCOUNTER — Ambulatory Visit: Payer: Managed Care, Other (non HMO) | Attending: Cardiology | Admitting: Cardiology

## 2022-05-16 VITALS — BP 120/76 | HR 59 | Ht 74.0 in | Wt >= 6400 oz

## 2022-05-16 DIAGNOSIS — Z79899 Other long term (current) drug therapy: Secondary | ICD-10-CM

## 2022-05-16 DIAGNOSIS — I5022 Chronic systolic (congestive) heart failure: Secondary | ICD-10-CM | POA: Diagnosis not present

## 2022-05-16 DIAGNOSIS — I472 Ventricular tachycardia, unspecified: Secondary | ICD-10-CM | POA: Diagnosis not present

## 2022-05-16 NOTE — Progress Notes (Signed)
Electrophysiology Office Note   Date:  05/16/2022   ID:  Mitchell Jennings., DOB December 06, 1965, MRN [id number]  PCP:  [name], MD  Cardiologist:  Shirlee Latch Primary Electrophysiologist:  Keven Osborn Jorja Loa, MD    No chief complaint on file.    History of Present Illness: Mitchell Jennings. is a 57 y.o. male who is being seen today for the evaluation of CHF at the request of [name], MD. Presenting today for electrophysiology evaluation.    He has a history significant for nonischemic cardiomyopathy, obesity post gastric sleeve, OSA on CPAP, CKD stage III.  November 2018 he developed exertional dyspnea found to have an ejection fraction of 20 to 25%.  He is status post Medtronic ICD implanted 01/17/2018.  He was admitted to the hospital August 2023 with VT storm.  He was started on amiodarone.  Today, denies symptoms of palpitations, chest pain, shortness of breath, orthopnea, PND, lower extremity edema, claudication, dizziness, presyncope, syncope, bleeding, or neurologic sequela. The patient is tolerating medications without difficulties.  Since being in the hospital he has done well.  He has had no further episodes of ventricular arrhythmias.  He is quite anxious about his ICD shocks.    Past Medical History:  Diagnosis Date   CHF (congestive heart failure) (HCC)    Dilated cardiomyopathy (HCC)    Pulmonary hypertension (HCC)    Past Surgical History:  Procedure Laterality Date   BARIATRIC SURGERY     ICD IMPLANT N/A 01/17/2018   Procedure: ICD IMPLANT;  Surgeon: Regan Lemming, MD;  Location: [facility];  Service: Cardiovascular;  Laterality: N/A;   RIGHT/LEFT HEART CATH AND CORONARY ANGIOGRAPHY N/A 07/31/2017   Procedure: RIGHT/LEFT HEART CATH AND CORONARY ANGIOGRAPHY;  Surgeon: Yvonne Kendall, MD;  Location: [facility];  Service: Cardiovascular;  Laterality: N/A;   UMBILICAL HERNIA REPAIR       Current Outpatient Medications   Medication Sig Dispense Refill   allopurinol (ZYLOPRIM) 300 MG tablet Take 300 mg by mouth daily.     amiodarone (PACERONE) 200 MG tablet Take 1 tablet (200 mg total) by mouth 2 (two) times daily. X 10 days, then taper down to to 200 mg PO daily (Patient taking differently: Take 200 mg by mouth daily. 1 by mouth once daily) 120 tablet 1   dapagliflozin propanediol (FARXIGA) 10 MG TABS tablet Take 1 tablet (10 mg total) by mouth daily before breakfast. 30 tablet 4   metoprolol succinate (TOPROL-XL) 100 MG 24 hr tablet Take 1 tablet (100 mg total) by mouth 2 (two) times daily. 60 tablet 6   Multiple Vitamins-Minerals (CENTRUM MEN) TABS Take 1 tablet by mouth daily.     OZEMPIC, 1 MG/DOSE, 4 MG/3ML SOPN Inject 1 mg into the skin once a week.     potassium chloride SA (KLOR-CON M20) 20 MEQ tablet Take 1 tablet (20 mEq total) by mouth every other day. 45 tablet 3   rivaroxaban (XARELTO) 20 MG TABS tablet Take 1 tablet (20 mg total) by mouth daily with supper. 30 tablet 11   rosuvastatin (CRESTOR) 5 MG tablet Take 1 tablet (5 mg total) by mouth daily. 90 tablet 2   sacubitril-valsartan (ENTRESTO) 24-26 MG Take 1 tablet by mouth 2 (two) times daily. 60 tablet 3   spironolactone (ALDACTONE) 25 MG tablet Take 1 tablet (25 mg total) by mouth daily. 30 tablet 11   torsemide (DEMADEX) 20 MG tablet Take 1 tablet (20 mg total)  by mouth every other day. 45 tablet 3   No current facility-administered medications for this visit.    Allergies:   Patient has no known allergies.   Social History:  The patient  reports that he has never smoked. He has never used smokeless tobacco. He reports that he does not drink alcohol and does not use drugs.   Family History:  The patient's family history includes Diabetes type II in his brother and father; Heart failure in his mother; Hypertension in his mother; Lung cancer in his mother.    ROS:  Please see the history of present illness.   Otherwise, review of systems is  positive for none.   All other systems are reviewed and negative.   PHYSICAL EXAM: VS:  BP 120/76   Pulse (!) 59   Ht 6\' 2"  (1.88 m)   Wt (!) 433 lb 6.4 oz (196.6 kg)   SpO2 97%   BMI 55.65 kg/m  , BMI Body mass index is 55.65 kg/m. GEN: Well nourished, well developed, in no acute distress  HEENT: normal  Neck: no JVD, carotid bruits, or masses Cardiac: RRR; no murmurs, rubs, or gallops,no edema  Respiratory:  clear to auscultation bilaterally, normal work of breathing GI: soft, nontender, nondistended, + BS MS: no deformity or atrophy  Skin: warm and dry, device site well healed Neuro:  Strength and sensation are intact Psych: euthymic mood, full affect  EKG:  EKG is ordered today. Personal review of the ekg ordered shows sinus rhythm, ventricular fusion complexes, rate 59  Personal review of the device interrogation today. Results in Paceart   Recent Labs: 01/25/2022: B Natriuretic Peptide 11.3; Magnesium 2.5 01/27/2022: Hemoglobin 16.8; Platelets 173 02/17/2022: ALT 22; BUN 17; Creatinine, Ser 1.84; Potassium 4.2; Sodium 145; TSH 2.300    Lipid Panel     Component Value Date/Time   CHOL 167 09/10/2021 1020   TRIG 222 (H) 09/10/2021 1020   HDL 40 (L) 09/10/2021 1020   CHOLHDL 4.2 09/10/2021 1020   VLDL 44 (H) 09/10/2021 1020   LDLCALC 83 09/10/2021 1020     Wt Readings from Last 3 Encounters:  05/16/22 (!) 433 lb 6.4 oz (196.6 kg)  02/17/22 (!) 432 lb (196 kg)  02/04/22 (!) 439 lb (199.1 kg)      Other studies Reviewed: Additional studies/ records that were reviewed today include: LHC/RHC 07/31/17  Review of the above records today demonstrates:  No angiographically significant coronary artery disease, consistent with non-ischemic cardiomyopathy. Moderately elevated right heart filling pressures. Moderately to severely elevated left heart filling pressures. Severe pulmonary hypertension. Low Fick cardiac output/index.  TTE 12/18/17 - Left ventricle: The  cavity size was mildly dilated. Wall   thickness was normal. The estimated ejection fraction was 30%.   Diffuse hypokinesis. Features are consistent with a pseudonormal   left ventricular filling pattern, with concomitant abnormal   relaxation and increased filling pressure (grade 2 diastolic   dysfunction). - Aortic valve: There was no stenosis. - Aorta: Mildly dilated aortic root. Aortic root dimension: 41 mm   (ED). - Mitral valve: There was mild regurgitation. - Left atrium: The atrium was mildly dilated. - Right ventricle: The cavity size was normal. Systolic function   was normal. - Pulmonary arteries: No complete TR doppler jet so unable to   estimate PA systolic pressure. - Systemic veins: IVC not visualized.   ASSESSMENT AND PLAN:  1.  Chronic systolic heart failure: Due to nonischemic cardiomyopathy.  Status post Medtronic dual-chamber ICD  implanted 01/17/2018.  Device functioning appropriately.  Currently on optimal medical therapy per primary cardiology.  2.  Morbid obesity: History of a gastric sleeve.  Reinforced exercise and diet control Body mass index is 55.65 kg/m.  3.  Obstructive sleep apnea: CPAP compliance encouraged  4.  Ventricular tachycardia: Currently on amiodarone 200 mg daily.  No further episode noted on device interrogation.  Continue current management.  VT could be due to heart failure as well as hypokalemia.  Amiodarone could be reduced to 100 mg at his heart failure appointment.  5.  High risk medication monitoring: Currently on amiodarone for VT as above.  TSH and LFTs have remained stable.  No changes.  Current medicines are reviewed at length with the patient today.   The patient does not have concerns regarding his medicines.  The following changes were made today: None  Labs/ tests ordered today include:  Orders Placed This Encounter  Procedures   EKG 12-Lead    Disposition:   FU 6 months  Signed, Aum Caggiano Jorja Loa, MD  05/16/2022  4:15 PM     [facility] [address] [place] [address] [phone] (office) [phone] (fax)

## 2022-05-25 ENCOUNTER — Ambulatory Visit (INDEPENDENT_AMBULATORY_CARE_PROVIDER_SITE_OTHER): Payer: Self-pay

## 2022-05-25 DIAGNOSIS — I428 Other cardiomyopathies: Secondary | ICD-10-CM

## 2022-05-25 LAB — CUP PACEART REMOTE DEVICE CHECK
Battery Remaining Longevity: 42 mo
Battery Voltage: 2.96 V
Brady Statistic AP VP Percent: 0.17 %
Brady Statistic AP VS Percent: 63.32 %
Brady Statistic AS VP Percent: 0.01 %
Brady Statistic AS VS Percent: 36.51 %
Brady Statistic RA Percent Paced: 63.29 %
Brady Statistic RV Percent Paced: 0.19 %
Date Time Interrogation Session: 20231220033527
HighPow Impedance: 89 Ohm
Implantable Lead Connection Status: 753985
Implantable Lead Connection Status: 753985
Implantable Lead Implant Date: 20190814
Implantable Lead Implant Date: 20190814
Implantable Lead Location: 753859
Implantable Lead Location: 753860
Implantable Lead Model: 5076
Implantable Pulse Generator Implant Date: 20190814
Lead Channel Impedance Value: 380 Ohm
Lead Channel Impedance Value: 456 Ohm
Lead Channel Impedance Value: 532 Ohm
Lead Channel Pacing Threshold Amplitude: 0.75 V
Lead Channel Pacing Threshold Amplitude: 0.875 V
Lead Channel Pacing Threshold Pulse Width: 0.4 ms
Lead Channel Pacing Threshold Pulse Width: 0.4 ms
Lead Channel Sensing Intrinsic Amplitude: 16 mV
Lead Channel Sensing Intrinsic Amplitude: 16 mV
Lead Channel Sensing Intrinsic Amplitude: 3.5 mV
Lead Channel Sensing Intrinsic Amplitude: 3.5 mV
Lead Channel Setting Pacing Amplitude: 1.75 V
Lead Channel Setting Pacing Amplitude: 2.5 V
Lead Channel Setting Pacing Pulse Width: 0.4 ms
Lead Channel Setting Sensing Sensitivity: 0.3 mV
Zone Setting Status: 755011
Zone Setting Status: 755011

## 2022-06-07 ENCOUNTER — Other Ambulatory Visit: Payer: Self-pay

## 2022-06-09 ENCOUNTER — Encounter (HOSPITAL_COMMUNITY): Payer: Self-pay | Admitting: Cardiology

## 2022-06-09 ENCOUNTER — Ambulatory Visit (HOSPITAL_COMMUNITY)
Admission: RE | Admit: 2022-06-09 | Discharge: 2022-06-09 | Disposition: A | Discharge status: Home / Self Care | Payer: Managed Care, Other (non HMO) | Source: Ambulatory Visit | Attending: Cardiology | Admitting: Cardiology

## 2022-06-09 VITALS — BP 100/60 | HR 71 | Wt >= 6400 oz

## 2022-06-09 DIAGNOSIS — I454 Nonspecific intraventricular block: Secondary | ICD-10-CM | POA: Insufficient documentation

## 2022-06-09 DIAGNOSIS — Z86718 Personal history of other venous thrombosis and embolism: Secondary | ICD-10-CM | POA: Diagnosis not present

## 2022-06-09 DIAGNOSIS — I428 Other cardiomyopathies: Secondary | ICD-10-CM | POA: Insufficient documentation

## 2022-06-09 DIAGNOSIS — Z9884 Bariatric surgery status: Secondary | ICD-10-CM | POA: Diagnosis not present

## 2022-06-09 DIAGNOSIS — I480 Paroxysmal atrial fibrillation: Secondary | ICD-10-CM | POA: Insufficient documentation

## 2022-06-09 DIAGNOSIS — I5022 Chronic systolic (congestive) heart failure: Secondary | ICD-10-CM | POA: Diagnosis not present

## 2022-06-09 DIAGNOSIS — Z8249 Family history of ischemic heart disease and other diseases of the circulatory system: Secondary | ICD-10-CM | POA: Insufficient documentation

## 2022-06-09 DIAGNOSIS — G4733 Obstructive sleep apnea (adult) (pediatric): Secondary | ICD-10-CM | POA: Diagnosis not present

## 2022-06-09 DIAGNOSIS — N183 Chronic kidney disease, stage 3 unspecified: Secondary | ICD-10-CM | POA: Insufficient documentation

## 2022-06-09 DIAGNOSIS — M109 Gout, unspecified: Secondary | ICD-10-CM | POA: Insufficient documentation

## 2022-06-09 DIAGNOSIS — Z95810 Presence of automatic (implantable) cardiac defibrillator: Secondary | ICD-10-CM | POA: Insufficient documentation

## 2022-06-09 DIAGNOSIS — Z7901 Long term (current) use of anticoagulants: Secondary | ICD-10-CM | POA: Insufficient documentation

## 2022-06-09 DIAGNOSIS — Z7985 Long-term (current) use of injectable non-insulin antidiabetic drugs: Secondary | ICD-10-CM | POA: Diagnosis not present

## 2022-06-09 DIAGNOSIS — Z7984 Long term (current) use of oral hypoglycemic drugs: Secondary | ICD-10-CM | POA: Diagnosis not present

## 2022-06-09 DIAGNOSIS — Z79899 Other long term (current) drug therapy: Secondary | ICD-10-CM | POA: Insufficient documentation

## 2022-06-09 DIAGNOSIS — R0600 Dyspnea, unspecified: Secondary | ICD-10-CM | POA: Insufficient documentation

## 2022-06-09 DIAGNOSIS — I472 Ventricular tachycardia, unspecified: Secondary | ICD-10-CM | POA: Diagnosis not present

## 2022-06-09 DIAGNOSIS — I714 Abdominal aortic aneurysm, without rupture, unspecified: Secondary | ICD-10-CM | POA: Insufficient documentation

## 2022-06-09 DIAGNOSIS — Z6841 Body Mass Index (BMI) 40.0 and over, adult: Secondary | ICD-10-CM | POA: Insufficient documentation

## 2022-06-09 MED ORDER — AMIODARONE HCL 200 MG PO TABS: 100.0000 mg | ORAL_TABLET | Freq: Every day | ORAL | Status: DC

## 2022-06-09 NOTE — Progress Notes (Addendum)
PCP: Dr. Quillian Quince Cardiology: Dr. Harl Bowie HF Cardiology: Dr. Aundra Dubin  57 y.o. with history of nonischemic cardiomyopathy was referred by Dr. Harl Bowie for evaluation of CHF.  Patient had no cardiac history prior to 1/19.  However, starting at about 11/18, he began to develop profound exertional dyspnea.  This happened after a flu-like illness.  This was gradually progressive, and he ended up at [facility] in 1/19.  Echo showed EF 20-25% with some RV failure.  He was diuresed extensively and sent to followup with Dr. Harl Bowie.  In 2/19, he had a right heart cath showing no significant coronary disease but elevated left and right-sided filling pressures and pulmonary venous hypertension were noted on RHC.  Cardiac index was low.    Echo in 7/19 showed EF 30% with diffuse hypokinesis and normal RV.  He had Medtronic ICD placed in 8/19.  Echo in 11/21 showed EF 35%, moderate-severe LV dilation, normal RV.   Admitted 8/13-8/16/23 after ICD shock at home, in the setting of hypokalemia.  ICD fired x3. Admitted with VT storm. Device interrogation confirmed appropriate shock x 3 for VT, ATP x 1 --> ? A fib. CXR ok. Optivol not suggestive of fluid overload. Started on amiodarone drip. EP consulted. Xarelto started given evidence of Afib on device interrogation. Echo showed EF 25-30% (35% in past). Given absence of CP and relatively recent cath in 2019 showing no CAD, repeat cardiac cath was felt not indicated. Recent admit earlier this month for VT storm and ICD shocks, in setting of hypokalemia. Spironolactone dose ultimately increased to 50 mg daily. Discharged home on PO amio.   Readmitted 01/26/22 for AKI and hypotension, presumed dehydration. GDMT held then eventually restarted at lower dose. Discharged home, weight 422 lbs.  Today he returns for followup of CHF.  He is doing well overall.  Working full time.  He can climb stairs without significant dyspnea.  No dyspnea walking on flat ground.  Weight down 4  lbs, he is on semaglutide.  No chest pain.  No palpitations or lightheadedness, no further VT now that he is on amiodarone.   Medtronic device interrogation: No VT/AF, stable thoracic impedance  (personally reviewed).    ECG (personally reviewed): a-paced, IVCD 138 msec  Labs (2/19): creatinine 1.55 Labs (3/19): TSH normal, K 3.6, creatinine 1.7 Labs (4/19): K 3.4, creatinine 1.67, LDL 149, selenium 144 (normal)  Labs (5/19): K 3.6, creatinine 1.68 Labs (8/19): K 3.6, creatinine 1.72, hgb 14.7 Labs (10/19): K 4.1, creatinine 1.6 Labs (12/19): K 4.1, creatinine 1.6 Labs (11/21): K 3.2, creatinine 1.54 Labs (3/22): K 2.7, creatinine 1.58 Labs (8/23): K 4.1, creatinine 1.77 Labs (1/24): K 4.4, creatinine 1.83, LFTs normal, TSH normal, LDL 78  PMH: 1. Obesity: h/o bariatric surgery (gastric sleeve).  2. OSA: Uses CPAP.  3. CKD: stage 3.  4. AAA: Abdominal US in 3/19 showed 3.4 cm AAA.  5. Chronic systolic CHF: Nonischemic cardiomyopathy.  First noted in 1/19.  - Echo (1/19, [place]): EF 20-25%, moderate to severe LV dilation, moderate to severe RV dilation with moderately decreased RV systolic function, mild to moderate MR, PASP 57 mmHg.  - RHC/LHC (1/19): No significant CAD.  Mean RA 12, PA 80/30 mean 47, mean PCWP 35, CI 1.7, PVR 2.4 WU.  - CPX (4/19): peak VO2 16.7, VE/VCO2 slope 26, RER 1.14 - Echo (7/19): EF 30% with diffuse hypokinesis, moderate diastolic dysfunction, normal RV size and systolic function.  - Medtronic ICD - Echo (11/21): EF 35%, moderate-severe LV dilation,  normal RV. - Echo (8/23): EF 25-30% (35% prior study), RV low normal. IVC not dilated 6. DVT right leg 7. Gout 8. Atrial fibrillation: Paroxysmal.  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   FH: Father with MI, uncle with CABG, uncle with MI, mother with MI and CHF.   SH: Married, lives in [place], works for a trucking company, no smoking, no ETOH/drugs.   ROS: All systems reviewed and negative except as per HPI.   Current  Outpatient Medications  Medication Sig Dispense Refill   allopurinol (ZYLOPRIM) 300 MG tablet Take 300 mg by mouth daily.     dapagliflozin propanediol (FARXIGA) 10 MG TABS tablet Take 1 tablet (10 mg total) by mouth daily before breakfast. 30 tablet 4   metoprolol succinate (TOPROL-XL) 100 MG 24 hr tablet Take 1 tablet (100 mg total) by mouth 2 (two) times daily. 60 tablet 6   Multiple Vitamins-Minerals (CENTRUM MEN) TABS Take 1 tablet by mouth daily.     OZEMPIC, 1 MG/DOSE, 4 MG/3ML SOPN Inject 1 mg into the skin once a week.     potassium chloride SA (KLOR-CON M20) 20 MEQ tablet Take 1 tablet (20 mEq total) by mouth every other day. 45 tablet 3   rivaroxaban (XARELTO) 20 MG TABS tablet Take 1 tablet (20 mg total) by mouth daily with supper. 30 tablet 11   rosuvastatin (CRESTOR) 5 MG tablet Take 1 tablet (5 mg total) by mouth daily. 90 tablet 2   sacubitril-valsartan (ENTRESTO) 24-26 MG Take 1 tablet by mouth 2 (two) times daily. 60 tablet 3   spironolactone (ALDACTONE) 25 MG tablet Take 1 tablet (25 mg total) by mouth daily. 30 tablet 11   torsemide (DEMADEX) 20 MG tablet Take 1 tablet (20 mg total) by mouth every other day. 45 tablet 3   amiodarone (PACERONE) 200 MG tablet Take 0.5 tablets (100 mg total) by mouth daily.     No current facility-administered medications for this encounter.   Wt Readings from Last 3 Encounters:  06/09/22 (!) 197.7 kg (435 lb 12.8 oz)  05/16/22 (!) 196.6 kg (433 lb 6.4 oz)  02/17/22 (!) 196 kg (432 lb)   BP 100/60   Pulse 71   Wt (!) 197.7 kg (435 lb 12.8 oz)   SpO2 99%   BMI 55.95 kg/m  General: NAD, obese.  Neck: No JVD, no thyromegaly or thyroid nodule.  Lungs: Clear to auscultation bilaterally with normal respiratory effort. CV: Nondisplaced PMI.  Heart regular S1/S2, no S3/S4, no murmur.  No peripheral edema.  No carotid bruit.  Normal pedal pulses.  Abdomen: Soft, nontender, no hepatosplenomegaly, no distention.  Skin: Intact without lesions or  rashes.  Neurologic: Alert and oriented x 3.  Psych: Normal affect. Extremities: No clubbing or cyanosis.  HEENT: Normal.   Assessment/Plan 1. Chronic Systolic Heart Failure: NICM. Has Medtronic ICD. Echo 8/23 with EF 25-30% (35% prior study), RV low normal. Chronically NYHC Class II, confounded by super morbid obesity and deconditioning. He is not volume overloaded by exam or Optivol.  - Continue torsemide 20 mg qod.  Recent BMET showed stable creatinine.  - Continue Toprol 100 mg bid. - Continue Farxiga 10 mg daily.   - Continue spironolactone 25 mg daily. - Continue Entresto 24/26 mg bid, no BP room to increase. - ECG with IVCD 138 msec, do not think wide enough to benefit from CRT.  2. H/o VT: in setting of NICM, has ICD. In 8/23, he had VT storm + ICD shocks x 3,  in setting of hypokalemia. Denies palpitations. No further ICD shocks or episodes of VT on device interrogation. - Decrease amiodarone to 100 mg daily.  Recent TSH and LFTs normal.  Will need regular eye exam.  - Follows with EP.  3. Stage III CKD: Recent creatinine 1.8, stable.  4. PAF: NSR on ECG today. - Continue Xarelto. 5. H/o LE DVT: On Xarelto. 6. OSA: Compliant with CPAP 7. Obesity: Body mass index is 55.95 kg/m. - Continue semaglutide.  Follow up in 4 months with APP.   Loralie Champagne 06/09/22

## 2022-06-09 NOTE — Patient Instructions (Addendum)
DECREASE Amiodarone to 100 mg daily   Your provider has ordered an abdominal ultra sound. You will be called to have the test arranged.  Your physician recommends that you schedule a follow-up appointment in: 4 months  If you have any questions or concerns before your next appointment please send [place] a message through [place] or call our office at [phone].    TO LEAVE A MESSAGE FOR THE NURSE SELECT OPTION 2, PLEASE LEAVE A MESSAGE INCLUDING: YOUR NAME DATE OF BIRTH CALL BACK NUMBER REASON FOR CALL**this is important as we prioritize the call backs  YOU WILL RECEIVE A CALL BACK THE SAME DAY AS LONG AS YOU CALL BEFORE 4:00 PM  At the [facility], you and your health needs are our priority. As part of our continuing mission to provide you with exceptional heart care, we have created designated Provider Care Teams. These Care Teams include your primary Cardiologist (physician) and Advanced Practice Providers (APPs- Physician Assistants and Nurse Practitioners) who all work together to provide you with the care you need, when you need it.   You may see any of the following providers on your designated Care Team at your next follow up: Dr Glori Bickers Dr Loralie Champagne Dr. Roxana Hires, NP Lyda Jester, [place] [facility] [place], [place] Forestine Na, NP Audry Riles, PharmD   Please be sure to bring in all your medications bottles to every appointment.

## 2022-06-20 NOTE — Progress Notes (Signed)
Remote ICD transmission.   

## 2022-06-28 ENCOUNTER — Telehealth (HOSPITAL_COMMUNITY): Payer: Self-pay

## 2022-06-28 ENCOUNTER — Ambulatory Visit: Payer: Managed Care, Other (non HMO) | Attending: Cardiology

## 2022-06-28 DIAGNOSIS — I714 Abdominal aortic aneurysm, without rupture, unspecified: Secondary | ICD-10-CM

## 2022-06-28 NOTE — Telephone Encounter (Signed)
Patient aware of results.

## 2022-07-05 ENCOUNTER — Encounter: Payer: Self-pay | Admitting: Cardiology

## 2022-07-05 ENCOUNTER — Other Ambulatory Visit (HOSPITAL_COMMUNITY): Payer: Self-pay | Admitting: Family Medicine

## 2022-08-24 ENCOUNTER — Ambulatory Visit (INDEPENDENT_AMBULATORY_CARE_PROVIDER_SITE_OTHER): Payer: Managed Care, Other (non HMO)

## 2022-08-24 DIAGNOSIS — I428 Other cardiomyopathies: Secondary | ICD-10-CM | POA: Diagnosis not present

## 2022-08-25 ENCOUNTER — Telehealth: Payer: Self-pay

## 2022-08-25 DIAGNOSIS — I472 Ventricular tachycardia, unspecified: Secondary | ICD-10-CM

## 2022-08-25 DIAGNOSIS — I5022 Chronic systolic (congestive) heart failure: Secondary | ICD-10-CM

## 2022-08-25 LAB — CUP PACEART REMOTE DEVICE CHECK
Battery Remaining Longevity: 31 mo
Battery Voltage: 2.96 V
Brady Statistic AP VP Percent: 0.03 %
Brady Statistic AP VS Percent: 45.6 %
Brady Statistic AS VP Percent: 0.02 %
Brady Statistic AS VS Percent: 54.35 %
Brady Statistic RA Percent Paced: 45.51 %
Brady Statistic RV Percent Paced: 0.05 %
Date Time Interrogation Session: 20240320012303
HighPow Impedance: 100 Ohm
Implantable Lead Connection Status: 753985
Implantable Lead Connection Status: 753985
Implantable Lead Implant Date: 20190814
Implantable Lead Implant Date: 20190814
Implantable Lead Location: 753859
Implantable Lead Location: 753860
Implantable Lead Model: 5076
Implantable Pulse Generator Implant Date: 20190814
Lead Channel Impedance Value: 399 Ohm
Lead Channel Impedance Value: 513 Ohm
Lead Channel Impedance Value: 570 Ohm
Lead Channel Pacing Threshold Amplitude: 0.75 V
Lead Channel Pacing Threshold Amplitude: 0.875 V
Lead Channel Pacing Threshold Pulse Width: 0.4 ms
Lead Channel Pacing Threshold Pulse Width: 0.4 ms
Lead Channel Sensing Intrinsic Amplitude: 16.625 mV
Lead Channel Sensing Intrinsic Amplitude: 16.625 mV
Lead Channel Sensing Intrinsic Amplitude: 3.5 mV
Lead Channel Sensing Intrinsic Amplitude: 3.5 mV
Lead Channel Setting Pacing Amplitude: 1.75 V
Lead Channel Setting Pacing Amplitude: 2.5 V
Lead Channel Setting Pacing Pulse Width: 0.4 ms
Lead Channel Setting Sensing Sensitivity: 0.3 mV
Zone Setting Status: 755011
Zone Setting Status: 755011

## 2022-08-25 NOTE — Telephone Encounter (Signed)
Left message to have him call back about increasing Amiodarone dose. Will forward to [place] also.

## 2022-08-25 NOTE — Telephone Encounter (Signed)
error 

## 2022-08-25 NOTE — Telephone Encounter (Signed)
I spoke to patient and indicated medication changes. He has a follow up already scheduled with our office.

## 2022-08-25 NOTE — Telephone Encounter (Signed)
Patient called back and would like a call back today

## 2022-08-25 NOTE — Telephone Encounter (Signed)
Received alert from [employer]: Scheduled remote reviewed. Normal device function.   There was one VT arrhythmia that fell into the VT monitor only zone and after 27 seconds it accelerated to the VF zone and was successfully converted with one burst of ATP, this occurred on 08/01/2022, sent to triage Next remote 91 days. Kathy Breach, RN, CCDS, [employer]  Patient had 1 VT accelerated to VF zone event treated successfully with ATP on 2/26.  Has history of VT storm with concerns for hypokalemia event in August 2023 (went to hospital). On 06/09/22 OV note patient was decreased to 100mg  daily on his Amiodarone. Forwarding to Dr. Curt Bears and Dr. Aundra Dubin for further review if labs or changes are needed at this point or continued monitoring.   LM for patient to call back to discuss and if he remembers feeling any symptoms.

## 2022-08-25 NOTE — Telephone Encounter (Signed)
Increase amiodarone to 200 mg daily.  Make sure he has followup soon with me or EP. Check BMET, Mg.

## 2022-08-26 ENCOUNTER — Other Ambulatory Visit: Payer: Self-pay

## 2022-08-26 ENCOUNTER — Encounter: Payer: Self-pay | Admitting: Cardiology

## 2022-08-26 DIAGNOSIS — I472 Ventricular tachycardia, unspecified: Secondary | ICD-10-CM

## 2022-08-26 NOTE — Telephone Encounter (Signed)
Spoke with patient, informed him that Dr. Curt Bears would like to get some labs done patient ok with going to [facility] to get labs done.

## 2022-08-26 NOTE — Addendum Note (Signed)
Addended by: Wanda Plump on: 08/26/2022 01:50 PM   Modules accepted: Orders

## 2022-08-26 NOTE — Addendum Note (Signed)
Addended by: Wanda Plump on: 08/26/2022 01:59 PM   Modules accepted: Orders

## 2022-08-26 NOTE — Telephone Encounter (Signed)
Patient is calling back again for the results of transmission. He is aware of med changes as seen below just needs some insight about what was seen on the transmission

## 2022-08-26 NOTE — Telephone Encounter (Signed)
error 

## 2022-08-26 NOTE — Telephone Encounter (Signed)
Spoke with patient informed him of the episode that occurred in 08/01/22 and that he only received the ATP not a shock, patient agreeable to apt on 09/07/22 at 0900 AM with Jonni Sanger

## 2022-09-05 ENCOUNTER — Other Ambulatory Visit (HOSPITAL_COMMUNITY): Payer: Self-pay

## 2022-09-06 ENCOUNTER — Other Ambulatory Visit (HOSPITAL_COMMUNITY): Payer: Self-pay | Admitting: Family Medicine

## 2022-09-06 DIAGNOSIS — I5022 Chronic systolic (congestive) heart failure: Secondary | ICD-10-CM

## 2022-09-06 NOTE — Progress Notes (Unsigned)
  Electrophysiology Office Note:   Date:  09/07/2022  ID:  Mitchell Creamer., DOB [birth year], MRN [id number]  Primary Cardiologist: None Electrophysiologist: Will Meredith Leeds, MD   History of Present Illness:   Mitchell Jennings. is a 57 y.o. male with h/o NICM, Obesity s/p gastric sleeve, OSA on CPAP, CKD stage III, and VT on amiodarone seen today for acute visit due to VT on ICD.    Received alert on 3/21 for VT accelerated into VF zone treated successfully with ATP on 2/26. Amiodarone increased to 200 mg daily. Pt obtained labwork at [facility] which was stable. K 4.1, Cr 1.61  Patient reports doing well overall since we have seen him last. He did have a URI leading up to the week of his ICD therapy. He had limited appetite and nausea, but no vomiting or diarrhea. Otherwise has been his USOH. On Ozempic with limited results so far, but getting ready to increase dose. Currently,  he denies chest pain, palpitations, dyspnea, PND, orthopnea, nausea, vomiting, dizziness, syncope, edema, weight gain, or early satiety.   Review of systems complete and found to be negative unless listed in HPI.   Device History: MDT dual chamber ICD, implanted 01/17/18   Studies Reviewed:    ICD Interrogation-  reviewed in detail today,  See PACEART report.  EKG is not ordered today. EKG from 06/09/2022 reviewed which showed AP-VS at 70 bpm    Risk Assessment/Calculations:            Physical Exam:   VS:  BP 118/70   Pulse 71   Ht 6\' 2"  (1.88 m)   Wt (!) 432 lb (196 kg)   SpO2 97%   BMI 55.47 kg/m    Wt Readings from Last 3 Encounters:  09/07/22 (!) 432 lb (196 kg)  06/09/22 (!) 435 lb 12.8 oz (197.7 kg)  05/16/22 (!) 433 lb 6.4 oz (196.6 kg)     GEN: Well nourished, well developed in no acute distress NECK: No JVD; No carotid bruits CARDIAC: Regular rate and rhythm, no murmurs, rubs, gallops RESPIRATORY:  Clear to auscultation without rales, wheezing or rhonchi  ABDOMEN: Soft, non-tender,  non-distended EXTREMITIES:  No edema; No deformity   ASSESSMENT AND PLAN:    Chronic systolic dysfunction s/p Medtronic dual chamber ICD  euvolemic today Stable on an appropriate medical regimen Normal ICD function See Pace Art report No changes today   VT  Most recent episode 2/26 after URI. He wasn't eating much due to nausea, so electrolytes could have again played a part, but there was a delay in episode to alert.  Will discuss with MDT and look at settings.  Continue amiodarone 200 mg daily Labs stable 3/25 at [facility] -> Surveillance labs today.  Reviewed driving restrictions  Disposition:   Follow up with Dr. Curt Bears in 6 months   Signed, Shirley Friar, PA-C

## 2022-09-07 ENCOUNTER — Ambulatory Visit: Payer: Managed Care, Other (non HMO) | Attending: Student | Admitting: Student

## 2022-09-07 ENCOUNTER — Encounter: Payer: Self-pay | Admitting: Student

## 2022-09-07 VITALS — BP 118/70 | HR 71 | Ht 74.0 in | Wt >= 6400 oz

## 2022-09-07 DIAGNOSIS — N183 Chronic kidney disease, stage 3 unspecified: Secondary | ICD-10-CM | POA: Diagnosis not present

## 2022-09-07 DIAGNOSIS — I472 Ventricular tachycardia, unspecified: Secondary | ICD-10-CM

## 2022-09-07 DIAGNOSIS — I4891 Unspecified atrial fibrillation: Secondary | ICD-10-CM

## 2022-09-07 DIAGNOSIS — I5022 Chronic systolic (congestive) heart failure: Secondary | ICD-10-CM

## 2022-09-07 DIAGNOSIS — I428 Other cardiomyopathies: Secondary | ICD-10-CM

## 2022-09-07 LAB — CUP PACEART INCLINIC DEVICE CHECK
Battery Remaining Longevity: 32 mo
Battery Voltage: 2.95 V
Brady Statistic AP VP Percent: 0.03 %
Brady Statistic AP VS Percent: 45.66 %
Brady Statistic AS VP Percent: 0.02 %
Brady Statistic AS VS Percent: 54.29 %
Brady Statistic RA Percent Paced: 45.57 %
Brady Statistic RV Percent Paced: 0.05 %
Date Time Interrogation Session: 20240403093718
HighPow Impedance: 90 Ohm
Implantable Lead Connection Status: 753985
Implantable Lead Connection Status: 753985
Implantable Lead Implant Date: 20190814
Implantable Lead Implant Date: 20190814
Implantable Lead Location: 753859
Implantable Lead Location: 753860
Implantable Lead Model: 5076
Implantable Pulse Generator Implant Date: 20190814
Lead Channel Impedance Value: 399 Ohm
Lead Channel Impedance Value: 456 Ohm
Lead Channel Impedance Value: 532 Ohm
Lead Channel Pacing Threshold Amplitude: 0.75 V
Lead Channel Pacing Threshold Amplitude: 0.875 V
Lead Channel Pacing Threshold Pulse Width: 0.4 ms
Lead Channel Pacing Threshold Pulse Width: 0.4 ms
Lead Channel Sensing Intrinsic Amplitude: 13.375 mV
Lead Channel Sensing Intrinsic Amplitude: 14.875 mV
Lead Channel Sensing Intrinsic Amplitude: 3.25 mV
Lead Channel Sensing Intrinsic Amplitude: 4.625 mV
Lead Channel Setting Pacing Amplitude: 1.75 V
Lead Channel Setting Pacing Amplitude: 2.5 V
Lead Channel Setting Pacing Pulse Width: 0.4 ms
Lead Channel Setting Sensing Sensitivity: 0.3 mV
Zone Setting Status: 755011
Zone Setting Status: 755011

## 2022-09-07 NOTE — Patient Instructions (Signed)
Medication Instructions:  Your physician recommends that you continue on your current medications as directed. Please refer to the Current Medication list given to you today.  *If you need a refill on your cardiac medications before your next appointment, please call your pharmacy*  Lab Work: CMET, MAG, TSH, FreeT4--TODAY If you have labs (blood work) drawn today and your tests are completely normal, you will receive your results only by: [place] (if you have MyChart) OR A paper copy in the mail If you have any lab test that is abnormal or we need to change your treatment, we will call you to review the results.  Follow-Up: At [facility], you and your health needs are our priority.  As part of our continuing mission to provide you with exceptional heart care, we have created designated Provider Care Teams.  These Care Teams include your primary Cardiologist (physician) and Advanced Practice Providers (APPs -  Physician Assistants and Nurse Practitioners) who all work together to provide you with the care you need, when you need it.  Your next appointment:   6 month(s)  Provider:   Allegra Lai, MD

## 2022-09-08 LAB — COMPREHENSIVE METABOLIC PANEL
ALT: 18 IU/L (ref 0–44)
AST: 19 IU/L (ref 0–40)
Albumin/Globulin Ratio: 1.3 (ref 1.2–2.2)
Albumin: 4 g/dL (ref 3.8–4.9)
Alkaline Phosphatase: 91 IU/L (ref 44–121)
BUN/Creatinine Ratio: 11 (ref 9–20)
BUN: 19 mg/dL (ref 6–24)
Bilirubin Total: 0.6 mg/dL (ref 0.0–1.2)
CO2: 23 mmol/L (ref 20–29)
Calcium: 9.2 mg/dL (ref 8.7–10.2)
Chloride: 106 mmol/L (ref 96–106)
Creatinine, Ser: 1.68 mg/dL — ABNORMAL HIGH (ref 0.76–1.27)
Globulin, Total: 3.1 g/dL (ref 1.5–4.5)
Glucose: 87 mg/dL (ref 70–99)
Potassium: 4.4 mmol/L (ref 3.5–5.2)
Sodium: 145 mmol/L — ABNORMAL HIGH (ref 134–144)
Total Protein: 7.1 g/dL (ref 6.0–8.5)
eGFR: 48 mL/min/{1.73_m2} — ABNORMAL LOW (ref >59)

## 2022-09-08 LAB — TSH: TSH: 0.861 u[IU]/mL (ref 0.450–4.500)

## 2022-09-08 LAB — T4, FREE: Free T4: 1.27 ng/dL (ref 0.82–1.77)

## 2022-09-08 LAB — MAGNESIUM: Magnesium: 2.4 mg/dL — ABNORMAL HIGH (ref 1.6–2.3)

## 2022-09-20 ENCOUNTER — Telehealth: Payer: Self-pay | Admitting: Cardiology

## 2022-09-20 NOTE — Telephone Encounter (Signed)
Pt c/o swelling: STAT is pt has developed SOB within 24 hours  How much weight have you gained and in what time span? Pts wife thinks hes gained some weight. She states she hasn't been able to get the pt up to check weight.   If swelling, where is the swelling located? Just seems to be located in his right ankle.   Are you currently taking a fluid pill? torsemide (DEMADEX) 20 MG tablet     Pts wife asked pt if he has taken any of his torsemide today, pt denied taking any today.   Are you currently SOB? Pts wife doesn't think so while laying down, but states the pt was walking to his truck this morning and was very sob while walking  Do you have a log of your daily weights (if so, list)?   Have you gained 3 pounds in a day or 5 pounds in a week?   Have you traveled recently? No   Pts wife states that pt is dizzy, lightheaded, really tired. Pt had fluid on him Sunday. He increased his lasix Sunday. Pt woke up yesterday with a bad headache. He is very lethargic and slept all day yesterday, and has slept all day today so far. Pts wife says that the pt had all these symptoms and was feeling the same way, when his defibrillator shocked him back in February. Pts wife states [facility] to see if a download could be done before pts wife takes pt to the hospital, and was advised to call Dr. Elberta Fortis office. Pts wife expresses that she doesn't want to pt to [facility] ER if it is the pts heart.   Call transferred to triage.

## 2022-09-20 NOTE — Telephone Encounter (Signed)
Transmission received no episodes, normal device function, does not appear to have fluid on board. Patients wife appreciative of call back, she stated they will just continue to monitor his symptoms

## 2022-09-20 NOTE — Telephone Encounter (Signed)
Spoke with wife Arline Asp who states that pt's symptoms began yesterday. Verified that pt's ozempic dose has not been increased yet. Verified that he is taking 200mg  Amio daily, and has been consistent with taking his Torsemide 20mg  every other day. Pt woke up yesterday with a bad headache, very lethargic and slept most of the day. States he has "slept all day today so far." No VS to report, but checked BP while on phone and reported 115/76, HR 70. Denies CP, Nausea/vomiting, endorses "heavier breathing" that started yesterday as well. Wife states swelling in extremities is mild, but is ultimately concerned of the symptoms as they are eerily similar to his event in February when he states his ICD shocked him.  Routing to device clinic to check his device and fluid status and Routing to [place] for further advisement.

## 2022-10-03 NOTE — Progress Notes (Signed)
Remote ICD transmission.   

## 2022-10-07 NOTE — Progress Notes (Signed)
PCP: Dr. Quillian Quince Cardiology: Dr. Harl Bowie HF Cardiology: Dr. Aundra Dubin  57 y.o. with history of nonischemic cardiomyopathy was referred by Dr. Harl Bowie for evaluation of CHF.  Patient had no cardiac history prior to 1/19.  However, starting at about 11/18, he began to develop profound exertional dyspnea.  This happened after a flu-like illness.  This was gradually progressive, and he ended up at [facility] in 1/19.  Echo showed EF 20-25% with some RV failure.  He was diuresed extensively and sent to followup with Dr. Harl Bowie.  In 2/19, he had a right heart cath showing no significant coronary disease but elevated left and right-sided filling pressures and pulmonary venous hypertension were noted on RHC.  Cardiac index was low.    Echo in 7/19 showed EF 30% with diffuse hypokinesis and normal RV.  He had Medtronic ICD placed in 8/19.  Echo in 11/21 showed EF 35%, moderate-severe LV dilation, normal RV.   Admitted 8/13-8/16/23 after ICD shock at home, in the setting of hypokalemia.  ICD fired x3. Admitted with VT storm. Device interrogation confirmed appropriate shock x 3 for VT, ATP x 1 --> ? A fib. CXR ok. Optivol not suggestive of fluid overload. Started on amiodarone drip. EP consulted. Xarelto started given evidence of Afib on device interrogation. Echo showed EF 25-30% (35% in past). Given absence of CP and relatively recent cath in 2019 showing no CAD, repeat cardiac cath was felt not indicated. Recent admit earlier this month for VT storm and ICD shocks, in setting of hypokalemia. Spironolactone dose ultimately increased to 50 mg daily. Discharged home on PO amio.   Readmitted 01/26/22 for AKI and hypotension, presumed dehydration. GDMT held then eventually restarted at lower dose. Discharged home, weight 422 lbs.  Today he returns for followup of CHF.  He is doing well overall.  Working full time.  He can climb stairs without significant dyspnea.  No dyspnea walking on flat ground.  Weight down 4  lbs, he is on semaglutide.  No chest pain.  No palpitations or lightheadedness, no further VT now that he is on amiodarone.   Medtronic device interrogation: No VT/AF, stable thoracic impedance  (personally reviewed).    ECG (personally reviewed): a-paced, IVCD 138 msec  Labs (2/19): creatinine 1.55 Labs (3/19): TSH normal, K 3.6, creatinine 1.7 Labs (4/19): K 3.4, creatinine 1.67, LDL 149, selenium 144 (normal)  Labs (5/19): K 3.6, creatinine 1.68 Labs (8/19): K 3.6, creatinine 1.72, hgb 14.7 Labs (10/19): K 4.1, creatinine 1.6 Labs (12/19): K 4.1, creatinine 1.6 Labs (11/21): K 3.2, creatinine 1.54 Labs (3/22): K 2.7, creatinine 1.58 Labs (8/23): K 4.1, creatinine 1.77 Labs (1/24): K 4.4, creatinine 1.83, LFTs normal, TSH normal, LDL 78  PMH: 1. Obesity: h/o bariatric surgery (gastric sleeve).  2. OSA: Uses CPAP.  3. CKD: stage 3.  4. AAA: Abdominal US in 3/19 showed 3.4 cm AAA.  5. Chronic systolic CHF: Nonischemic cardiomyopathy.  First noted in 1/19.  - Echo (1/19, [facility]): EF 20-25%, moderate to severe LV dilation, moderate to severe RV dilation with moderately decreased RV systolic function, mild to moderate MR, PASP 57 mmHg.  - RHC/LHC (1/19): No significant CAD.  Mean RA 12, PA 80/30 mean 47, mean PCWP 35, CI 1.7, PVR 2.4 WU.  - CPX (4/19): peak VO2 16.7, VE/VCO2 slope 26, RER 1.14 - Echo (7/19): EF 30% with diffuse hypokinesis, moderate diastolic dysfunction, normal RV size and systolic function.  - Medtronic ICD - Echo (11/21): EF 35%, moderate-severe LV dilation,  normal RV. - Echo (8/23): EF 25-30% (35% prior study), RV low normal. IVC not dilated 6. DVT right leg 7. Gout 8. Atrial fibrillation: Paroxysmal.  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   FH: Father with MI, uncle with CABG, uncle with MI, mother with MI and CHF.   SH: Married, lives in [place], works for a trucking company, no smoking, no ETOH/drugs.   ROS: All systems reviewed and negative except as per HPI.   Current  Outpatient Medications  Medication Sig Dispense Refill   allopurinol (ZYLOPRIM) 300 MG tablet Take 300 mg by mouth daily.     amiodarone (PACERONE) 200 MG tablet Take 200 mg by mouth daily.     dapagliflozin propanediol (FARXIGA) 10 MG TABS tablet Take 1 tablet (10 mg total) by mouth daily before breakfast. 30 tablet 4   metoprolol succinate (TOPROL-XL) 100 MG 24 hr tablet TAKE 1 TABLET BY MOUTH TWICE DAILY, take WITH FOOD OR immediately following a meal 60 tablet 6   Multiple Vitamins-Minerals (CENTRUM MEN) TABS Take 1 tablet by mouth daily.     OZEMPIC, 1 MG/DOSE, 4 MG/3ML SOPN Inject 1 mg into the skin once a week.     potassium chloride SA (KLOR-CON M20) 20 MEQ tablet Take 1 tablet (20 mEq total) by mouth every other day. 45 tablet 3   rivaroxaban (XARELTO) 20 MG TABS tablet Take 1 tablet (20 mg total) by mouth daily with supper. 30 tablet 11   rosuvastatin (CRESTOR) 5 MG tablet TAKE 1 TABLET BY MOUTH EVERY DAY 90 tablet 2   sacubitril-valsartan (ENTRESTO) 24-26 MG Take 1 tablet by mouth 2 (two) times daily. 60 tablet 3   spironolactone (ALDACTONE) 25 MG tablet Take 1 tablet (25 mg total) by mouth daily. 30 tablet 11   torsemide (DEMADEX) 20 MG tablet Take 1 tablet (20 mg total) by mouth every other day. 45 tablet 3   No current facility-administered medications for this visit.   Wt Readings from Last 3 Encounters:  09/07/22 (!) 196 kg (432 lb)  06/09/22 (!) 197.7 kg (435 lb 12.8 oz)  05/16/22 (!) 196.6 kg (433 lb 6.4 oz)   There were no vitals taken for this visit. General: NAD, obese.  Neck: No JVD, no thyromegaly or thyroid nodule.  Lungs: Clear to auscultation bilaterally with normal respiratory effort. CV: Nondisplaced PMI.  Heart regular S1/S2, no S3/S4, no murmur.  No peripheral edema.  No carotid bruit.  Normal pedal pulses.  Abdomen: Soft, nontender, no hepatosplenomegaly, no distention.  Skin: Intact without lesions or rashes.  Neurologic: Alert and oriented x 3.  Psych:  Normal affect. Extremities: No clubbing or cyanosis.  HEENT: Normal.   Assessment/Plan 1. Chronic Systolic Heart Failure: NICM. Has Medtronic ICD. Echo 8/23 with EF 25-30% (35% prior study), RV low normal. Chronically NYHC Class II, confounded by super morbid obesity and deconditioning. He is not volume overloaded by exam or Optivol.  - Continue torsemide 20 mg qod.  Recent BMET showed stable creatinine.  - Continue Toprol 100 mg bid. - Continue Farxiga 10 mg daily.   - Continue spironolactone 25 mg daily. - Continue Entresto 24/26 mg bid, no BP room to increase. - ECG with IVCD 138 msec, do not think wide enough to benefit from CRT.  2. H/o VT: in setting of NICM, has ICD. In 8/23, he had VT storm + ICD shocks x 3, in setting of hypokalemia. Denies palpitations. No further ICD shocks or episodes of VT on device interrogation. - Decrease amiodarone  to 100 mg daily.  Recent TSH and LFTs normal.  Will need regular eye exam.  - Follows with EP.  3. Stage III CKD: Recent creatinine 1.8, stable.  4. PAF: NSR on ECG today. - Continue Xarelto. 5. H/o LE DVT: On Xarelto. 6. OSA: Compliant with CPAP 7. Obesity: There is no height or weight on file to calculate BMI. - Continue semaglutide.  Follow up in 4 months with APP.   [facility] 10/07/22

## 2022-10-10 ENCOUNTER — Encounter (HOSPITAL_COMMUNITY): Payer: Self-pay

## 2022-10-10 ENCOUNTER — Ambulatory Visit (HOSPITAL_COMMUNITY)
Admission: RE | Admit: 2022-10-10 | Discharge: 2022-10-10 | Disposition: A | Discharge status: Home / Self Care | Payer: Managed Care, Other (non HMO) | Source: Ambulatory Visit | Attending: Family Medicine | Admitting: Family Medicine

## 2022-10-10 VITALS — BP 100/66 | HR 73 | Wt >= 6400 oz

## 2022-10-10 DIAGNOSIS — G4733 Obstructive sleep apnea (adult) (pediatric): Secondary | ICD-10-CM | POA: Insufficient documentation

## 2022-10-10 DIAGNOSIS — N183 Chronic kidney disease, stage 3 unspecified: Secondary | ICD-10-CM | POA: Diagnosis not present

## 2022-10-10 DIAGNOSIS — I428 Other cardiomyopathies: Secondary | ICD-10-CM | POA: Diagnosis not present

## 2022-10-10 DIAGNOSIS — Z86718 Personal history of other venous thrombosis and embolism: Secondary | ICD-10-CM | POA: Insufficient documentation

## 2022-10-10 DIAGNOSIS — Z7984 Long term (current) use of oral hypoglycemic drugs: Secondary | ICD-10-CM | POA: Diagnosis not present

## 2022-10-10 DIAGNOSIS — Z7901 Long term (current) use of anticoagulants: Secondary | ICD-10-CM | POA: Insufficient documentation

## 2022-10-10 DIAGNOSIS — Z6841 Body Mass Index (BMI) 40.0 and over, adult: Secondary | ICD-10-CM | POA: Insufficient documentation

## 2022-10-10 DIAGNOSIS — I4891 Unspecified atrial fibrillation: Secondary | ICD-10-CM

## 2022-10-10 DIAGNOSIS — I5020 Unspecified systolic (congestive) heart failure: Secondary | ICD-10-CM | POA: Diagnosis not present

## 2022-10-10 DIAGNOSIS — I472 Ventricular tachycardia, unspecified: Secondary | ICD-10-CM | POA: Diagnosis not present

## 2022-10-10 DIAGNOSIS — Z79899 Other long term (current) drug therapy: Secondary | ICD-10-CM | POA: Diagnosis not present

## 2022-10-10 DIAGNOSIS — I480 Paroxysmal atrial fibrillation: Secondary | ICD-10-CM | POA: Diagnosis not present

## 2022-10-10 DIAGNOSIS — I5022 Chronic systolic (congestive) heart failure: Secondary | ICD-10-CM | POA: Diagnosis present

## 2022-10-10 NOTE — Patient Instructions (Addendum)
No change in medications. No labs today. Return to see Dr. Shirlee Latch with echo in four months - see below.  Please call us at [phone] if any questions or concerns prior to your next visit.

## 2022-10-11 ENCOUNTER — Encounter (HOSPITAL_COMMUNITY): Payer: Self-pay

## 2022-11-23 ENCOUNTER — Ambulatory Visit (INDEPENDENT_AMBULATORY_CARE_PROVIDER_SITE_OTHER): Payer: Managed Care, Other (non HMO)

## 2022-11-23 DIAGNOSIS — I5020 Unspecified systolic (congestive) heart failure: Secondary | ICD-10-CM | POA: Diagnosis not present

## 2022-11-24 LAB — CUP PACEART REMOTE DEVICE CHECK
Battery Remaining Longevity: 30 mo
Battery Voltage: 2.96 V
Brady Statistic AP VP Percent: 0.03 %
Brady Statistic AP VS Percent: 37.79 %
Brady Statistic AS VP Percent: 0.01 %
Brady Statistic AS VS Percent: 62.17 %
Brady Statistic RA Percent Paced: 37.73 %
Brady Statistic RV Percent Paced: 0.04 %
Date Time Interrogation Session: 20240619002206
HighPow Impedance: 96 Ohm
Implantable Lead Connection Status: 753985
Implantable Lead Connection Status: 753985
Implantable Lead Implant Date: 20190814
Implantable Lead Implant Date: 20190814
Implantable Lead Location: 753859
Implantable Lead Location: 753860
Implantable Lead Model: 5076
Implantable Pulse Generator Implant Date: 20190814
Lead Channel Impedance Value: 380 Ohm
Lead Channel Impedance Value: 494 Ohm
Lead Channel Impedance Value: 532 Ohm
Lead Channel Pacing Threshold Amplitude: 0.75 V
Lead Channel Pacing Threshold Amplitude: 0.75 V
Lead Channel Pacing Threshold Pulse Width: 0.4 ms
Lead Channel Pacing Threshold Pulse Width: 0.4 ms
Lead Channel Sensing Intrinsic Amplitude: 15.875 mV
Lead Channel Sensing Intrinsic Amplitude: 15.875 mV
Lead Channel Sensing Intrinsic Amplitude: 3.75 mV
Lead Channel Sensing Intrinsic Amplitude: 3.75 mV
Lead Channel Setting Pacing Amplitude: 1.5 V
Lead Channel Setting Pacing Amplitude: 2.5 V
Lead Channel Setting Pacing Pulse Width: 0.4 ms
Lead Channel Setting Sensing Sensitivity: 0.3 mV
Zone Setting Status: 755011
Zone Setting Status: 755011

## 2022-12-20 ENCOUNTER — Telehealth: Payer: Self-pay

## 2022-12-20 NOTE — Telephone Encounter (Signed)
Transmission reviewed,  No episodes, patient in NSR, normal device function. Patient stated his BP was 119/80, patient noted that he gets dizzy especially when standing up advised patient to stand up slowly.Patient asked about fluid levels on transmission no fluid overload noted. Patient stated that today he took an extra dose of Lasix, patient normally takes Lasix MWF. Advised patient that he may feel dizzy today due to extra dose. Advised patient I would send over to HF for recommendations on how to proceed with lasix dose.

## 2022-12-20 NOTE — Telephone Encounter (Signed)
I let the patient know the recommendation per phone notes on 7/16. The patient also spoke with Boneta Lucks, rn.

## 2022-12-20 NOTE — Telephone Encounter (Signed)
Pt wife called in stating pt has been feeling dizzy and lightheaded and has sent in a transmission and would like someone to review it and give her a call back

## 2022-12-20 NOTE — Telephone Encounter (Signed)
Pt given instructions via HF team regarding Torsemide and Spiro.  Pt indicates understanding and will call HF clinic if any further questions.  Follow up with WC per recall scheduled.

## 2022-12-20 NOTE — Telephone Encounter (Signed)
LVM for patient to call device clinic back, regarding lasix, and spirolactone dosage per HF team

## 2022-12-27 ENCOUNTER — Other Ambulatory Visit (HOSPITAL_COMMUNITY): Payer: Self-pay | Admitting: Family Medicine

## 2023-02-02 ENCOUNTER — Other Ambulatory Visit (HOSPITAL_COMMUNITY): Payer: Self-pay | Admitting: Family Medicine

## 2023-02-02 ENCOUNTER — Other Ambulatory Visit (HOSPITAL_COMMUNITY): Payer: Self-pay | Admitting: Cardiology

## 2023-02-02 DIAGNOSIS — I5020 Unspecified systolic (congestive) heart failure: Secondary | ICD-10-CM

## 2023-02-02 MED ORDER — SPIRONOLACTONE 25 MG PO TABS
25.0000 mg | ORAL_TABLET | Freq: Every day | ORAL | 3 refills | Status: DC
Start: 2023-02-02 — End: 2024-01-22

## 2023-02-03 ENCOUNTER — Other Ambulatory Visit (HOSPITAL_COMMUNITY): Payer: Self-pay

## 2023-02-03 MED ORDER — RIVAROXABAN 20 MG PO TABS: 20.0000 mg | ORAL_TABLET | Freq: Every day | ORAL | 11 refills | Status: DC

## 2023-02-13 ENCOUNTER — Other Ambulatory Visit (HOSPITAL_COMMUNITY): Payer: Self-pay | Admitting: Family Medicine

## 2023-02-22 ENCOUNTER — Ambulatory Visit (INDEPENDENT_AMBULATORY_CARE_PROVIDER_SITE_OTHER): Payer: Managed Care, Other (non HMO)

## 2023-02-22 DIAGNOSIS — I472 Ventricular tachycardia, unspecified: Secondary | ICD-10-CM | POA: Diagnosis not present

## 2023-02-22 LAB — CUP PACEART REMOTE DEVICE CHECK
Battery Remaining Longevity: 29 mo
Battery Voltage: 2.95 V
Brady Statistic AP VP Percent: 0.04 %
Brady Statistic AP VS Percent: 54.36 %
Brady Statistic AS VP Percent: 0.01 %
Brady Statistic AS VS Percent: 45.59 %
Brady Statistic RA Percent Paced: 54.33 %
Brady Statistic RV Percent Paced: 0.05 %
Date Time Interrogation Session: 20240918012305
HighPow Impedance: 98 Ohm
Implantable Lead Connection Status: 753985
Implantable Lead Connection Status: 753985
Implantable Lead Implant Date: 20190814
Implantable Lead Implant Date: 20190814
Implantable Lead Location: 753859
Implantable Lead Location: 753860
Implantable Lead Model: 5076
Implantable Pulse Generator Implant Date: 20190814
Lead Channel Impedance Value: 380 Ohm
Lead Channel Impedance Value: 494 Ohm
Lead Channel Impedance Value: 532 Ohm
Lead Channel Pacing Threshold Amplitude: 0.625 V
Lead Channel Pacing Threshold Amplitude: 0.75 V
Lead Channel Pacing Threshold Pulse Width: 0.4 ms
Lead Channel Pacing Threshold Pulse Width: 0.4 ms
Lead Channel Sensing Intrinsic Amplitude: 16.625 mV
Lead Channel Sensing Intrinsic Amplitude: 16.625 mV
Lead Channel Sensing Intrinsic Amplitude: 3.875 mV
Lead Channel Sensing Intrinsic Amplitude: 3.875 mV
Lead Channel Setting Pacing Amplitude: 1.5 V
Lead Channel Setting Pacing Amplitude: 2.5 V
Lead Channel Setting Pacing Pulse Width: 0.4 ms
Lead Channel Setting Sensing Sensitivity: 0.3 mV
Zone Setting Status: 755011
Zone Setting Status: 755011

## 2023-02-27 ENCOUNTER — Ambulatory Visit (HOSPITAL_COMMUNITY)
Admission: RE | Admit: 2023-02-27 | Discharge: 2023-02-27 | Disposition: A | Discharge status: Home / Self Care | Payer: Managed Care, Other (non HMO) | Source: Ambulatory Visit | Attending: Cardiology | Admitting: Cardiology

## 2023-02-27 ENCOUNTER — Ambulatory Visit (HOSPITAL_BASED_OUTPATIENT_CLINIC_OR_DEPARTMENT_OTHER)
Admission: RE | Admit: 2023-02-27 | Discharge: 2023-02-27 | Disposition: A | Discharge status: Home / Self Care | Payer: Managed Care, Other (non HMO) | Source: Ambulatory Visit | Attending: Family Medicine | Admitting: Family Medicine

## 2023-02-27 ENCOUNTER — Encounter (HOSPITAL_COMMUNITY): Payer: Self-pay | Admitting: Cardiology

## 2023-02-27 VITALS — BP 130/80 | HR 65 | Wt >= 6400 oz

## 2023-02-27 DIAGNOSIS — Z79899 Other long term (current) drug therapy: Secondary | ICD-10-CM | POA: Insufficient documentation

## 2023-02-27 DIAGNOSIS — Z86718 Personal history of other venous thrombosis and embolism: Secondary | ICD-10-CM | POA: Diagnosis not present

## 2023-02-27 DIAGNOSIS — I5021 Acute systolic (congestive) heart failure: Secondary | ICD-10-CM

## 2023-02-27 DIAGNOSIS — I5023 Acute on chronic systolic (congestive) heart failure: Secondary | ICD-10-CM | POA: Insufficient documentation

## 2023-02-27 DIAGNOSIS — I5020 Unspecified systolic (congestive) heart failure: Secondary | ICD-10-CM

## 2023-02-27 DIAGNOSIS — I480 Paroxysmal atrial fibrillation: Secondary | ICD-10-CM | POA: Insufficient documentation

## 2023-02-27 DIAGNOSIS — G4733 Obstructive sleep apnea (adult) (pediatric): Secondary | ICD-10-CM | POA: Diagnosis not present

## 2023-02-27 DIAGNOSIS — Z7901 Long term (current) use of anticoagulants: Secondary | ICD-10-CM | POA: Insufficient documentation

## 2023-02-27 DIAGNOSIS — I447 Left bundle-branch block, unspecified: Secondary | ICD-10-CM | POA: Diagnosis not present

## 2023-02-27 DIAGNOSIS — Z7984 Long term (current) use of oral hypoglycemic drugs: Secondary | ICD-10-CM | POA: Diagnosis not present

## 2023-02-27 DIAGNOSIS — N183 Chronic kidney disease, stage 3 unspecified: Secondary | ICD-10-CM | POA: Insufficient documentation

## 2023-02-27 DIAGNOSIS — I5022 Chronic systolic (congestive) heart failure: Secondary | ICD-10-CM

## 2023-02-27 DIAGNOSIS — I428 Other cardiomyopathies: Secondary | ICD-10-CM | POA: Insufficient documentation

## 2023-02-27 DIAGNOSIS — Z6841 Body Mass Index (BMI) 40.0 and over, adult: Secondary | ICD-10-CM | POA: Diagnosis not present

## 2023-02-27 DIAGNOSIS — I272 Pulmonary hypertension, unspecified: Secondary | ICD-10-CM | POA: Diagnosis not present

## 2023-02-27 LAB — COMPREHENSIVE METABOLIC PANEL
ALT: 18 U/L (ref 0–44)
AST: 18 U/L (ref 15–41)
Albumin: 3.7 g/dL (ref 3.5–5.0)
Alkaline Phosphatase: 72 U/L (ref 38–126)
Anion gap: 8 (ref 5–15)
BUN: 18 mg/dL (ref 6–20)
CO2: 27 mmol/L (ref 22–32)
Calcium: 8.8 mg/dL — ABNORMAL LOW (ref 8.9–10.3)
Chloride: 104 mmol/L (ref 98–111)
Creatinine, Ser: 1.81 mg/dL — ABNORMAL HIGH (ref 0.61–1.24)
GFR, Estimated: 44 mL/min — ABNORMAL LOW (ref >60)
Glucose, Bld: 96 mg/dL (ref 70–99)
Potassium: 4 mmol/L (ref 3.5–5.1)
Sodium: 139 mmol/L (ref 135–145)
Total Bilirubin: 0.8 mg/dL (ref 0.3–1.2)
Total Protein: 7.4 g/dL (ref 6.5–8.1)

## 2023-02-27 LAB — ECHOCARDIOGRAM COMPLETE
AR max vel: 2.56 cm2
AV Area VTI: 2.34 cm2
AV Area mean vel: 2.51 cm2
AV Mean grad: 3.5 mmHg
AV Peak grad: 6.6 mmHg
Ao pk vel: 1.28 m/s
Area-P 1/2: 3.54 cm2
S' Lateral: 5.95 cm

## 2023-02-27 LAB — BRAIN NATRIURETIC PEPTIDE: B Natriuretic Peptide: 31.1 pg/mL (ref 0.0–100.0)

## 2023-02-27 LAB — TSH: TSH: 1.295 u[IU]/mL (ref 0.350–4.500)

## 2023-02-27 MED ORDER — ENTRESTO 49-51 MG PO TABS: 1.0000 | ORAL_TABLET | Freq: Two times a day (BID) | ORAL | 11 refills | Status: DC

## 2023-02-27 MED ORDER — PERFLUTREN LIPID MICROSPHERE
1.0000 mL | INTRAVENOUS | Status: DC | PRN
  Administered 2023-02-27: 4 mL via INTRAVENOUS

## 2023-02-27 NOTE — Patient Instructions (Signed)
INCREASE Entresto to 49/51 mg Twice daily  Labs done today, your results will be available in MyChart, we will contact you for abnormal readings.  Repeat blood work in 10 days  PLEASE CALL Dr. Elberta Fortis office to arrange a follow up appointment.  Your physician recommends that you schedule a follow-up appointment in: 3 months  If you have any questions or concerns before your next appointment please send [place] a message through [place] or call our office at [phone].    TO LEAVE A MESSAGE FOR THE NURSE SELECT OPTION 2, PLEASE LEAVE A MESSAGE INCLUDING: YOUR NAME DATE OF BIRTH CALL BACK NUMBER REASON FOR CALL**this is important as we prioritize the call backs  YOU WILL RECEIVE A CALL BACK THE SAME DAY AS LONG AS YOU CALL BEFORE 4:00 PM  At the [facility], you and your health needs are our priority. As part of our continuing mission to provide you with exceptional heart care, we have created designated Provider Care Teams. These Care Teams include your primary Cardiologist (physician) and Advanced Practice Providers (APPs- Physician Assistants and Nurse Practitioners) who all work together to provide you with the care you need, when you need it.   You may see any of the following providers on your designated Care Team at your next follow up: Dr Arvilla Meres Dr Marca Ancona Dr. Marcos Eke, NP Robbie Lis, [place] [facility] [place], [place] Brynda Peon, NP Karle Plumber, PharmD   Please be sure to bring in all your medications bottles to every appointment.    Thank you for choosing [facility]

## 2023-02-27 NOTE — Progress Notes (Signed)
PCP: Dr. Reuel Boom Cardiology: Dr. Wyline Mood HF Cardiology: Dr. Shirlee Latch  57 y.o. with history of nonischemic cardiomyopathy was referred by Dr. Wyline Mood for evaluation of CHF.  Patient had no cardiac history prior to 1/19.  However, starting at about 11/18, he began to develop profound exertional dyspnea.  This happened after a flu-like illness.  This was gradually progressive, and he ended up at [facility] in 1/19.  Echo showed EF 20-25% with some RV failure.  He was diuresed extensively and sent to followup with Dr. Wyline Mood.  In 2/19, he had a right heart cath showing no significant coronary disease but elevated left and right-sided filling pressures and pulmonary venous hypertension were noted on RHC.  Cardiac index was low.    Echo in 7/19 showed EF 30% with diffuse hypokinesis and normal RV.  He had Medtronic ICD placed in 8/19.  Echo in 11/21 showed EF 35%, moderate-severe LV dilation, normal RV.   Admitted 8/13-8/16/23 after ICD shock at home, in the setting of hypokalemia.  ICD fired x3. Admitted with VT storm. Device interrogation confirmed appropriate shock x 3 for VT, ATP x 1 --> ? A fib. CXR ok. Optivol not suggestive of fluid overload. Started on amiodarone drip. EP consulted. Xarelto started given evidence of Afib on device interrogation. Echo showed EF 25-30% (35% in past). Given absence of CP and relatively recent cath in 2019 showing no CAD, repeat cardiac cath was felt not indicated. Recent admit earlier this month for VT storm and ICD shocks, in setting of hypokalemia. Spironolactone dose ultimately increased to 50 mg daily. Discharged home on PO amio.   Readmitted 01/26/22 for AKI and hypotension, presumed dehydration. GDMT held then eventually restarted at lower dose. Discharged home, weight 422 lbs.  Echo was done today and reviewed, EF 20-25%, severe LV dilation, normal RV, IVC normal.   Today he returns for followup of CHF.  Weight down 7 lbs, taking semaglutide. No significant  dyspnea, can walk up 2 flights of stairs at work without problems.  No chest pain.  No lightheadedness/palpitations. Uses CPAP at night.   Medtronic device interrogation: No VT/AF, 49% a-paced, stable thoracic impedance  (personally reviewed).    ECG (personally reviewed): a-paced, LBBB 148 msec  Labs (2/19): creatinine 1.55 Labs (3/19): TSH normal, K 3.6, creatinine 1.7 Labs (4/19): K 3.4, creatinine 1.67, LDL 149, selenium 144 (normal)  Labs (5/19): K 3.6, creatinine 1.68 Labs (8/19): K 3.6, creatinine 1.72, hgb 14.7 Labs (10/19): K 4.1, creatinine 1.6 Labs (12/19): K 4.1, creatinine 1.6 Labs (11/21): K 3.2, creatinine 1.54 Labs (3/22): K 2.7, creatinine 1.58 Labs (8/23): K 4.1, creatinine 1.77 Labs (1/24): K 4.4, creatinine 1.83, LFTs normal, TSH normal, LDL 78 Labs (4/24): TSH normal, K 4.4, creatinine 1.68, LFTs normal  PMH: 1. Obesity: h/o bariatric surgery (gastric sleeve).  2. OSA: Uses CPAP.  3. CKD: stage 3.  4. AAA: Abdominal US in 3/19 showed 3.4 cm AAA.  5. Chronic systolic CHF: Nonischemic cardiomyopathy.  First noted in 1/19.  - Echo (1/19, [facility]): EF 20-25%, moderate to severe LV dilation, moderate to severe RV dilation with moderately decreased RV systolic function, mild to moderate MR, PASP 57 mmHg.  - RHC/LHC (1/19): No significant CAD.  Mean RA 12, PA 80/30 mean 47, mean PCWP 35, CI 1.7, PVR 2.4 WU.  - CPX (4/19): peak VO2 16.7, VE/VCO2 slope 26, RER 1.14 - Echo (7/19): EF 30% with diffuse hypokinesis, moderate diastolic dysfunction, normal RV size and systolic function.  - Medtronic  ICD - Echo (11/21): EF 35%, moderate-severe LV dilation, normal RV. - Echo (8/23): EF 25-30% (35% prior study), RV low normal. IVC not dilated - Echo (9/24): EF 20-25%, severe LV dilation, normal RV, IVC normal.  6. DVT right leg 7. Gout 8. Atrial fibrillation: Paroxysmal.  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   FH: Father with MI, uncle with CABG, uncle with MI, mother with MI and CHF.    SH: Married, lives in [place], works for a trucking company, no smoking, no ETOH/drugs.   ROS: All systems reviewed and negative except as per HPI.   Current Outpatient Medications  Medication Sig Dispense Refill   allopurinol (ZYLOPRIM) 300 MG tablet Take 300 mg by mouth daily.     amiodarone (PACERONE) 200 MG tablet Take 200 mg by mouth daily.     dapagliflozin propanediol (FARXIGA) 10 MG TABS tablet Take 1 tablet (10 mg total) by mouth daily before breakfast. 30 tablet 4   metoprolol succinate (TOPROL-XL) 100 MG 24 hr tablet TAKE 1 TABLET BY MOUTH TWICE A DAY WITH FOOD, OR IMMEDIATELY FOLLOWING A MEAL 180 tablet 3   Multiple Vitamins-Minerals (CENTRUM MEN) TABS Take 1 tablet by mouth daily.     OZEMPIC, 1 MG/DOSE, 4 MG/3ML SOPN Inject 2 mg into the skin once a week. On Sundays     Potassium Chloride ER 20 MEQ TBCR TAKE 1 TABLET BY MOUTH EVERY OTHER DAY 15 tablet 4   rivaroxaban (XARELTO) 20 MG TABS tablet Take 1 tablet (20 mg total) by mouth daily with supper. 30 tablet 11   rosuvastatin (CRESTOR) 5 MG tablet TAKE 1 TABLET BY MOUTH EVERY DAY 90 tablet 2   sacubitril-valsartan (ENTRESTO) 49-51 MG Take 1 tablet by mouth 2 (two) times daily. 60 tablet 11   spironolactone (ALDACTONE) 25 MG tablet Take 1 tablet (25 mg total) by mouth daily. 90 tablet 3   torsemide (DEMADEX) 20 MG tablet Take 1 tablet (20 mg total) by mouth every other day. 45 tablet 3   No current facility-administered medications for this encounter.   Wt Readings from Last 3 Encounters:  02/27/23 (!) 189.6 kg (418 lb)  10/10/22 (!) 193.1 kg (425 lb 12.8 oz)  09/07/22 (!) 196 kg (432 lb)   BP 130/80   Pulse 65   Wt (!) 189.6 kg (418 lb)   SpO2 99%   BMI 53.67 kg/m  General: NAD, obese Neck: No JVD, no thyromegaly or thyroid nodule.  Lungs: Clear to auscultation bilaterally with normal respiratory effort. CV: Nondisplaced PMI.  Heart regular S1/S2, no S3/S4, no murmur.  No peripheral edema.  No carotid bruit.  Normal  pedal pulses.  Abdomen: Soft, nontender, no hepatosplenomegaly, no distention.  Skin: Intact without lesions or rashes.  Neurologic: Alert and oriented x 3.  Psych: Normal affect. Extremities: No clubbing or cyanosis.  HEENT: Normal.   Assessment/Plan 1. Chronic Systolic Heart Failure: NICM. Has Medtronic ICD. Echo today showed EF 20-25%, severe LV dilation, normal RV, IVC normal. Chronically NYHA class II, confounded by super morbid obesity and deconditioning. He is not volume overloaded by exam or Optivol.  QRS has been widening over time, today has LBBB with QRS 148 msec.  - Increase Entresto to 49/51 bid and change torsemide to prn. BMET/BNP today, BMET 10 days.  - Continue Toprol 100 mg bid. - Continue Farxiga 10 mg daily.   - Continue spironolactone 25 mg daily. - I am going to send him back to Dr. Elberta Fortis for evaluation for upgrade of  device for CRT.  2. H/o VT: in setting of NICM, has ICD. In 8/23, he had VT storm + ICD shocks x 3, in setting of hypokalemia. Denies palpitations. No further ICD shocks or episodes of VT on device interrogation. - Continue amiodarone 200 mg daily.  Check LFTs and TSH.  Will need regular eye exam.  - Follows with EP.  3. Stage III CKD: BMET today.  4. PAF: NSR on ECG today. - Continue Xarelto. 5. H/o LE DVT: On Xarelto. 6. OSA: Compliant with CPAP 7. Obesity: Body mass index is 53.67 kg/m.  Weight slowly coming down.  - Continue semaglutide.  Follow up in 3 months with APP.   Marca Ancona 02/27/23

## 2023-02-27 NOTE — Progress Notes (Signed)
*  PRELIMINARY RESULTS* Echocardiogram 2D Echocardiogram has been performed.  Mitchell Jennings 02/27/2023, 1:44 PM

## 2023-02-28 NOTE — Progress Notes (Signed)
Remote ICD transmission.   

## 2023-03-01 ENCOUNTER — Telehealth: Payer: Self-pay | Admitting: Cardiology

## 2023-03-01 NOTE — Telephone Encounter (Signed)
Aware Dr. Elberta Fortis will discuss this with him at his appt 10/17. Patient verbalized understanding and agreeable to plan.

## 2023-03-01 NOTE — Telephone Encounter (Signed)
Patient states Dr. Shirlee Latch recommends adding a third lead to PPM and recommended contacting Dr. Elberta Fortis' office to schedule this.

## 2023-03-09 NOTE — Progress Notes (Signed)
Remote ICD transmission.   

## 2023-03-15 ENCOUNTER — Other Ambulatory Visit (HOSPITAL_COMMUNITY)
Admission: RE | Admit: 2023-03-15 | Discharge: 2023-03-15 | Disposition: A | Discharge status: Home / Self Care | Payer: Managed Care, Other (non HMO) | Source: Ambulatory Visit | Attending: Cardiology | Admitting: Cardiology

## 2023-03-15 DIAGNOSIS — I5022 Chronic systolic (congestive) heart failure: Secondary | ICD-10-CM | POA: Diagnosis present

## 2023-03-15 LAB — BASIC METABOLIC PANEL
Anion gap: 6 (ref 5–15)
BUN: 16 mg/dL (ref 6–20)
CO2: 25 mmol/L (ref 22–32)
Calcium: 8.6 mg/dL — ABNORMAL LOW (ref 8.9–10.3)
Chloride: 105 mmol/L (ref 98–111)
Creatinine, Ser: 1.48 mg/dL — ABNORMAL HIGH (ref 0.61–1.24)
GFR, Estimated: 56 mL/min — ABNORMAL LOW (ref >60)
Glucose, Bld: 92 mg/dL (ref 70–99)
Potassium: 4.1 mmol/L (ref 3.5–5.1)
Sodium: 136 mmol/L (ref 135–145)

## 2023-03-21 ENCOUNTER — Encounter: Payer: Self-pay | Admitting: Cardiology

## 2023-03-23 ENCOUNTER — Ambulatory Visit: Payer: Managed Care, Other (non HMO) | Admitting: Cardiology

## 2023-03-27 ENCOUNTER — Other Ambulatory Visit (HOSPITAL_COMMUNITY): Payer: Self-pay

## 2023-03-27 DIAGNOSIS — I5022 Chronic systolic (congestive) heart failure: Secondary | ICD-10-CM

## 2023-03-27 MED ORDER — TORSEMIDE 20 MG PO TABS: 20.0000 mg | ORAL_TABLET | ORAL | 3 refills | Status: DC

## 2023-04-17 ENCOUNTER — Ambulatory Visit: Payer: Managed Care, Other (non HMO) | Attending: Cardiology | Admitting: Pulmonary Disease

## 2023-04-17 ENCOUNTER — Encounter: Payer: Self-pay | Admitting: Pulmonary Disease

## 2023-04-17 VITALS — BP 102/80 | HR 81 | Ht 74.0 in | Wt >= 6400 oz

## 2023-04-17 DIAGNOSIS — I5020 Unspecified systolic (congestive) heart failure: Secondary | ICD-10-CM

## 2023-04-17 DIAGNOSIS — G4733 Obstructive sleep apnea (adult) (pediatric): Secondary | ICD-10-CM

## 2023-04-17 DIAGNOSIS — Z86718 Personal history of other venous thrombosis and embolism: Secondary | ICD-10-CM

## 2023-04-17 DIAGNOSIS — I4891 Unspecified atrial fibrillation: Secondary | ICD-10-CM

## 2023-04-17 DIAGNOSIS — I472 Ventricular tachycardia, unspecified: Secondary | ICD-10-CM | POA: Diagnosis not present

## 2023-04-17 DIAGNOSIS — I428 Other cardiomyopathies: Secondary | ICD-10-CM

## 2023-04-17 NOTE — Patient Instructions (Signed)
Medication Instructions:  Your physician recommends that you continue on your current medications as directed. Please refer to the Current Medication list given to you today.  *If you need a refill on your cardiac medications before your next appointment, please call your pharmacy*  Lab Work: None ordered If you have labs (blood work) drawn today and your tests are completely normal, you will receive your results only by: MyChart Message (if you have MyChart) OR A paper copy in the mail If you have any lab test that is abnormal or we need to change your treatment, we will call you to review the results.  Follow-Up: At [facility], you and your health needs are our priority.  As part of our continuing mission to provide you with exceptional heart care, we have created designated Provider Care Teams.  These Care Teams include your primary Cardiologist (physician) and Advanced Practice Providers (APPs -  Physician Assistants and Nurse Practitioners) who all work together to provide you with the care you need, when you need it.  Your next appointment:   4 month(s)  Provider:   Loman Brooklyn, MD

## 2023-04-17 NOTE — Progress Notes (Signed)
Electrophysiology Office Note:   Date:  04/17/2023  ID:  Mitchell Limes., DOB [birth year], MRN [id number]  Primary Cardiologist: None Electrophysiologist: Will Jorja Loa, MD       History of Present Illness:   Mitchell Campillo. is a 57 y.o. male with h/o paroxysmal AF, HFrEF, NICM s/p ICD, VT, LBBB, pulmonary venous hypertension, CKD III, obesity s/p gastric sleeve, OSA & DVT seen today for routine electrophysiology followup.   Last seen in EP Clinic 09/2022 for an acute visit after VT was noted on ICD. The patient had a URI the week leading up to therapy, decreased PO intake.  Alert showed VT accelerated into VF zone and was successfully treated with  ATP (08/01/22). Amiodarone was increased to 200 mg daily.   Seen in HF Clinic 02/2023, felt to have chronic NYHA Class II symptoms, QRS widening over time with LBBB / QRS 148 ms (was narrow in 2019 at time of ICD implant).   Since last being seen in our clinic the patient reports doing well overall. He has lost ~ 40lbs since starting Ozempic 1 year ago. His wife accompanies him. He is compliant with his CPAP.  Pt reports he is able to climb 2 flights of stairs, works daily and is not necessarily limited by SOB with activity.  denies chest pain, palpitations, dyspnea, PND, orthopnea, nausea, vomiting, dizziness, syncope, edema, weight gain, or early satiety.   Review of systems complete and found to be negative unless listed in HPI.    EP Information / Studies Reviewed:    EKG is ordered today. Personal review as below.  EKG Interpretation Date/Time:  Monday April 17 2023 13:32:19 EST Ventricular Rate:  81 PR Interval:  216 QRS Duration:  160 QT Interval:  454 QTC Calculation: 527 R Axis:   118  Text Interpretation: Sinus rhythm with 1st degree A-V block Non-specific intra-ventricular conduction block Confirmed by Mitchell Jennings ([id number]) on 04/17/2023 4:29:47 PM   ICD Interrogation-  reviewed in detail today,  See PACEART  report.  Device History: Medtronic Dual Chamber ICD implanted 01/15/2018 for NICM / HFrEF  History of appropriate therapy: Yes, 01/2022 in setting of hypokalemia. ATP 07/2022.  History of AAD therapy: Yes; currently on amiodarone     Studies:  ECHO 02/2023 >  LVEF 20-25%, LV has severely decreased function, no RWMA, mild LVH, G1DD  Arrhythmia / AAD Paroxysmal AF  VT > started on amiodarone in 01/2022 after ATP & HV therapy  Risk Assessment/Calculations:    CHA2DS2-VASc Score = 2   This indicates a 2.2% annual risk of stroke. The patient's score is based upon: CHF History: 1 HTN History: 0 Diabetes History: 0 Stroke History: 0 Vascular Disease History: 1 Age Score: 0 Gender Score: 0             Physical Exam:   VS:  BP 102/80   Pulse 81   Ht 6\' 2"  (1.88 m)   Wt (!) 420 lb 6.4 oz (190.7 kg)   SpO2 94%   BMI 53.98 kg/m    Wt Readings from Last 3 Encounters:  04/17/23 (!) 420 lb 6.4 oz (190.7 kg)  02/27/23 (!) 418 lb (189.6 kg)  10/10/22 (!) 425 lb 12.8 oz (193.1 kg)     GEN: Well nourished, well developed in no acute distress NECK: No JVD; No carotid bruits CARDIAC: Regular rate and rhythm, no murmurs, rubs, gallops. Left chest device without tethering, no edema or erythema at site.  RESPIRATORY:  Clear  to auscultation without rales, wheezing or rhonchi  ABDOMEN: Soft, non-tender, non-distended EXTREMITIES:  No edema; No deformity   ASSESSMENT AND PLAN:    Chronic Systolic Dysfunction s/p Medtronic dual chamber ICD  NICM Hx VT  -euvolemic today -EKG with QRS, NYHA II symptoms. LBBB pattern -reviewed with Dr. Elberta Fortis, pt BMI would need to be near 40 for consideration of CS lead placement  -extensive discussion regarding weight loss, dietary recommendations, & exercise -stable on an appropriate medical regimen -normal ICD function -see Pace Art report -no changes today -continue amiodarone, labs up to date  -GDMT per Advanced HF Team   Paroxysmal AF   -OAC as below  -0% burden on device   Secondary Hypercoagulable State  -continue Xarelto 20mg  daily   Hx LE DVT  -OAC as above  OSA  -CPAP compliance encouraged   Disposition:   Follow up with Dr. Elberta Fortis  4 months to review weight loss progress.      Signed, Mitchell Brim, MSN, APRN, NP-C, AGACNP-BC [facility] - Electrophysiology  04/17/2023, 4:31 PM

## 2023-05-15 NOTE — Progress Notes (Signed)
ADVANCED HF CLINIC NOTE  PCP: Dr. Reuel Boom Cardiology: Dr. Wyline Mood HF Cardiology: Dr. Shirlee Latch  CC: Heart failure follow up  57 y.o. with history of nonischemic cardiomyopathy was referred by Dr. Wyline Mood for evaluation of CHF.  Patient had no cardiac history prior to 1/19.  Starting 11/18, he began to develop profound exertional dyspnea.  This happened after a flu-like illness.  This was gradually progressive, and he ended up at [facility] in 1/19.  Echo showed EF 20-25% with some RV failure.  He was diuresed extensively and sent to followup with Dr. Wyline Mood.  In 2/19, he had a right heart cath showing no significant coronary disease but elevated left and right-sided filling pressures and pulmonary venous hypertension were noted on RHC.  Cardiac index was low.    Echo in 7/19 showed EF 30% with diffuse hypokinesis and normal RV.  He had Medtronic ICD placed in 8/19.  Echo in 11/21 showed EF 35%, moderate-severe LV dilation, normal RV.   Admitted 8/13-8/16/23 after ICD shock at home, in the setting of hypokalemia.  ICD fired x3. Admitted with VT storm. Device interrogation confirmed appropriate shock x 3 for VT, ATP x 1 --> ? A fib. CXR ok. Optivol not suggestive of fluid overload. Started on amiodarone drip. EP consulted. Xarelto started given evidence of Afib on device interrogation. Echo showed EF 25-30% (35% in past). Given absence of CP and relatively recent cath in 2019 showing no CAD, repeat cardiac cath was felt not indicated. Recent admit earlier this month for VT storm and ICD shocks, in setting of hypokalemia. Spironolactone dose ultimately increased to 50 mg daily. Discharged home on PO amio.   Readmitted 01/26/22 for AKI and hypotension, presumed dehydration. GDMT held then eventually restarted at lower dose. Discharged home, weight 422 lbs.  Echo 9/24 EF 20-25%, severe LV dilation, normal RV, IVC normal.   Seen by Dr. Elberta Fortis 11/24 for evaluation for upgrade of device for CRT. Needs  to loose weight prior to being eligible for CRT upgrade  Today he returns for AHF follow up with his wife. Overall feeling good. Denies palpitations, CP, dizziness, edema, or PND/Orthopnea. Denies SOB. Appetite ok. No fever or chills. Weight at home 108 pounds at home, weights daily. Taking all medications. Taking semaglutide. Takes torsemide PRN.   Medtronic device interrogation:  No VT/AF, 58.7% a-paced, stable thoracic impedance. Active 1.4hrs/day (personally reviewed).    ECG (personally reviewed from 04/17/23): NSR with 1st degree AVB    PMH: 1. Obesity: h/o bariatric surgery (gastric sleeve).  2. OSA: Uses CPAP.  3. CKD: stage 3.  4. AAA: Abdominal US in 3/19 showed 3.4 cm AAA.  5. Chronic systolic CHF: Nonischemic cardiomyopathy.  First noted in 1/19.  - Echo (1/19, [place]): EF 20-25%, moderate to severe LV dilation, moderate to severe RV dilation with moderately decreased RV systolic function, mild to moderate MR, PASP 57 mmHg.  - RHC/LHC (1/19): No significant CAD.  Mean RA 12, PA 80/30 mean 47, mean PCWP 35, CI 1.7, PVR 2.4 WU.  - CPX (4/19): peak VO2 16.7, VE/VCO2 slope 26, RER 1.14 - Echo (7/19): EF 30% with diffuse hypokinesis, moderate diastolic dysfunction, normal RV size and systolic function.  - Medtronic ICD - Echo (11/21): EF 35%, moderate-severe LV dilation, normal RV. - Echo (8/23): EF 25-30% (35% prior study), RV low normal. IVC not dilated - Echo (9/24): EF 20-25%, severe LV dilation, normal RV, IVC normal.  6. DVT right leg 7. Gout 8. Atrial fibrillation: Paroxysmal.  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   FH: Father with MI, uncle with CABG, uncle with MI, mother with MI and CHF.   SH: Married, lives in [place], works for a trucking company, no smoking, no ETOH/drugs.   ROS: All systems reviewed and negative except as per HPI.   Current Outpatient Medications  Medication Sig Dispense Refill   allopurinol (ZYLOPRIM) 300 MG tablet Take 300 mg by mouth daily.     amiodarone  (PACERONE) 200 MG tablet Take 200 mg by mouth daily.     dapagliflozin propanediol (FARXIGA) 10 MG TABS tablet Take 1 tablet (10 mg total) by mouth daily before breakfast. 30 tablet 4   metoprolol succinate (TOPROL-XL) 100 MG 24 hr tablet TAKE 1 TABLET BY MOUTH TWICE A DAY WITH FOOD, OR IMMEDIATELY FOLLOWING A MEAL 180 tablet 3   Multiple Vitamins-Minerals (CENTRUM MEN) TABS Take 1 tablet by mouth daily.     OZEMPIC, 1 MG/DOSE, 4 MG/3ML SOPN Inject 2 mg into the skin once a week. On Sundays     Potassium Chloride ER 20 MEQ TBCR TAKE 1 TABLET BY MOUTH EVERY OTHER DAY 45 tablet 2   rivaroxaban (XARELTO) 20 MG TABS tablet Take 1 tablet (20 mg total) by mouth daily with supper. 30 tablet 11   rosuvastatin (CRESTOR) 5 MG tablet TAKE 1 TABLET BY MOUTH EVERY DAY 90 tablet 2   sacubitril-valsartan (ENTRESTO) 49-51 MG Take 1 tablet by mouth 2 (two) times daily. 60 tablet 11   spironolactone (ALDACTONE) 25 MG tablet Take 1 tablet (25 mg total) by mouth daily. 90 tablet 3   torsemide (DEMADEX) 20 MG tablet Take 1 tablet (20 mg total) by mouth every other day. 45 tablet 3   No current facility-administered medications for this encounter.   Wt Readings from Last 3 Encounters:  05/29/23 (!) 189.5 kg (417 lb 12.8 oz)  04/17/23 (!) 190.7 kg (420 lb 6.4 oz)  02/27/23 (!) 189.6 kg (418 lb)   BP 97/70   Pulse 65   Ht 6\' 2"  (1.88 m)   Wt (!) 189.5 kg (417 lb 12.8 oz)   SpO2 97%   BMI 53.64 kg/m  Physical exam:  General:  well appearing.  No respiratory difficulty HEENT: normal Neck: supple. JVD difficult to see (thick neck). Carotids 2+ bilat; no bruits. No lymphadenopathy or thyromegaly appreciated. Cor: PMI nondisplaced. Regular rate & rhythm. No rubs, gallops or murmurs. Lungs: clear, diminished bases Abdomen: soft, nontender, nondistended. No hepatosplenomegaly. No bruits or masses. Good bowel sounds. Extremities: no cyanosis, clubbing, rash, +1 BLE edema  Neuro: alert & oriented x 3, cranial  nerves grossly intact. moves all 4 extremities w/o difficulty. Affect pleasant.   Assessment/Plan 1. Chronic Systolic Heart Failure: NICM. Has Medtronic ICD. Echo today showed EF 20-25%, severe LV dilation, normal RV, IVC normal.  - Chronically NYHA class II, confounded by super morbid obesity and deconditioning. Mildly volume overloaded on exam and Optivol.   - Continue Entresto 49/51 bid, will not increase with soft BP (previously didn't tolerate max dose Entresto either) - Reports significant UOP with Torsemide. Instructed him to take 20 mg Torsemide daily for 3 days. Will have Randon Goldsmith send me a device transmission to follow volume in about a week. Can go back to PRN after 3 days, will reevaluate after transmission reviewed.  - Continue Toprol 100 mg bid. - Continue Farxiga 10 mg daily.   - Continue spironolactone 25 mg daily. - BMET/BNP today - Seen by Dr. Elberta Fortis for evaluation  for upgrade of device for CRT. Needs to loose weight prior to being eligible for CRT upgrade 2. H/o VT: in setting of NICM, has ICD. In 8/23, he had VT storm + ICD shocks x 3, in setting of hypokalemia. Denies palpitations. No further ICD shocks or episodes of VT on device interrogation. - Continue amiodarone 200 mg daily.  LFTs and TSH stable 3 months ago.  Will need regular eye exam, working on scheduling that.  - Follows with EP.  3. Stage III CKD: BMET today.  4. PAF:  - Continue Xarelto. 5. H/o LE DVT: On Xarelto. 6. OSA: Compliant with CPAP 7. Obesity: Body mass index is 53.64 kg/m.  Weight slowly coming down.  - Continue semaglutide, down about 40 lbs.   Follow up in 3-4 months with APP.   Alen Bleacher 05/29/23

## 2023-05-24 ENCOUNTER — Ambulatory Visit (INDEPENDENT_AMBULATORY_CARE_PROVIDER_SITE_OTHER): Payer: Self-pay

## 2023-05-24 ENCOUNTER — Other Ambulatory Visit (HOSPITAL_COMMUNITY): Payer: Self-pay | Admitting: Family Medicine

## 2023-05-24 DIAGNOSIS — I5020 Unspecified systolic (congestive) heart failure: Secondary | ICD-10-CM

## 2023-05-24 DIAGNOSIS — I428 Other cardiomyopathies: Secondary | ICD-10-CM

## 2023-05-24 LAB — CUP PACEART REMOTE DEVICE CHECK
Battery Remaining Longevity: 32 mo
Battery Voltage: 2.95 V
Brady Statistic AP VP Percent: 0.04 %
Brady Statistic AP VS Percent: 57.88 %
Brady Statistic AS VP Percent: 0.02 %
Brady Statistic AS VS Percent: 42.07 %
Brady Statistic RA Percent Paced: 57.89 %
Brady Statistic RV Percent Paced: 0.05 %
Date Time Interrogation Session: 20241218044225
HighPow Impedance: 92 Ohm
Implantable Lead Connection Status: 753985
Implantable Lead Connection Status: 753985
Implantable Lead Implant Date: 20190814
Implantable Lead Implant Date: 20190814
Implantable Lead Location: 753859
Implantable Lead Location: 753860
Implantable Lead Model: 5076
Implantable Pulse Generator Implant Date: 20190814
Lead Channel Impedance Value: 342 Ohm
Lead Channel Impedance Value: 456 Ohm
Lead Channel Impedance Value: 494 Ohm
Lead Channel Pacing Threshold Amplitude: 0.75 V
Lead Channel Pacing Threshold Amplitude: 0.75 V
Lead Channel Pacing Threshold Pulse Width: 0.4 ms
Lead Channel Pacing Threshold Pulse Width: 0.4 ms
Lead Channel Sensing Intrinsic Amplitude: 16.25 mV
Lead Channel Sensing Intrinsic Amplitude: 16.25 mV
Lead Channel Sensing Intrinsic Amplitude: 3.625 mV
Lead Channel Sensing Intrinsic Amplitude: 3.625 mV
Lead Channel Setting Pacing Amplitude: 1.5 V
Lead Channel Setting Pacing Amplitude: 2.5 V
Lead Channel Setting Pacing Pulse Width: 0.4 ms
Lead Channel Setting Sensing Sensitivity: 0.3 mV
Zone Setting Status: 755011
Zone Setting Status: 755011

## 2023-05-29 ENCOUNTER — Encounter (HOSPITAL_COMMUNITY): Payer: Self-pay

## 2023-05-29 ENCOUNTER — Ambulatory Visit (HOSPITAL_COMMUNITY)
Admission: RE | Admit: 2023-05-29 | Discharge: 2023-05-29 | Disposition: A | Discharge status: Home / Self Care | Payer: Managed Care, Other (non HMO) | Source: Ambulatory Visit | Attending: Internal Medicine | Admitting: Internal Medicine

## 2023-05-29 VITALS — BP 97/70 | HR 65 | Ht 74.0 in | Wt >= 6400 oz

## 2023-05-29 DIAGNOSIS — Z6841 Body Mass Index (BMI) 40.0 and over, adult: Secondary | ICD-10-CM | POA: Insufficient documentation

## 2023-05-29 DIAGNOSIS — N183 Chronic kidney disease, stage 3 unspecified: Secondary | ICD-10-CM | POA: Diagnosis not present

## 2023-05-29 DIAGNOSIS — Z8679 Personal history of other diseases of the circulatory system: Secondary | ICD-10-CM

## 2023-05-29 DIAGNOSIS — Z4502 Encounter for adjustment and management of automatic implantable cardiac defibrillator: Secondary | ICD-10-CM | POA: Diagnosis not present

## 2023-05-29 DIAGNOSIS — Z7901 Long term (current) use of anticoagulants: Secondary | ICD-10-CM | POA: Diagnosis not present

## 2023-05-29 DIAGNOSIS — I4891 Unspecified atrial fibrillation: Secondary | ICD-10-CM | POA: Diagnosis not present

## 2023-05-29 DIAGNOSIS — Z79899 Other long term (current) drug therapy: Secondary | ICD-10-CM | POA: Diagnosis not present

## 2023-05-29 DIAGNOSIS — I428 Other cardiomyopathies: Secondary | ICD-10-CM | POA: Insufficient documentation

## 2023-05-29 DIAGNOSIS — I5022 Chronic systolic (congestive) heart failure: Secondary | ICD-10-CM | POA: Diagnosis not present

## 2023-05-29 DIAGNOSIS — I480 Paroxysmal atrial fibrillation: Secondary | ICD-10-CM | POA: Diagnosis not present

## 2023-05-29 DIAGNOSIS — Z86718 Personal history of other venous thrombosis and embolism: Secondary | ICD-10-CM | POA: Insufficient documentation

## 2023-05-29 DIAGNOSIS — G4733 Obstructive sleep apnea (adult) (pediatric): Secondary | ICD-10-CM | POA: Insufficient documentation

## 2023-05-29 LAB — LIPID PANEL
Cholesterol: 123 mg/dL (ref 0–200)
HDL: 38 mg/dL — ABNORMAL LOW (ref >40)
LDL Cholesterol: 48 mg/dL (ref 0–99)
Total CHOL/HDL Ratio: 3.2 {ratio}
Triglycerides: 183 mg/dL — ABNORMAL HIGH (ref <150)
VLDL: 37 mg/dL (ref 0–40)

## 2023-05-29 LAB — CBC
HCT: 49.5 % (ref 39.0–52.0)
Hemoglobin: 16.9 g/dL (ref 13.0–17.0)
MCH: 33.2 pg (ref 26.0–34.0)
MCHC: 34.1 g/dL (ref 30.0–36.0)
MCV: 97.2 fL (ref 80.0–100.0)
Platelets: 193 10*3/uL (ref 150–400)
RBC: 5.09 MIL/uL (ref 4.22–5.81)
RDW: 13.2 % (ref 11.5–15.5)
WBC: 7.5 10*3/uL (ref 4.0–10.5)
nRBC: 0 % (ref 0.0–0.2)

## 2023-05-29 LAB — BASIC METABOLIC PANEL
Anion gap: 7 (ref 5–15)
BUN: 18 mg/dL (ref 6–20)
CO2: 26 mmol/L (ref 22–32)
Calcium: 8.6 mg/dL — ABNORMAL LOW (ref 8.9–10.3)
Chloride: 103 mmol/L (ref 98–111)
Creatinine, Ser: 1.81 mg/dL — ABNORMAL HIGH (ref 0.61–1.24)
GFR, Estimated: 44 mL/min — ABNORMAL LOW (ref >60)
Glucose, Bld: 99 mg/dL (ref 70–99)
Potassium: 3.7 mmol/L (ref 3.5–5.1)
Sodium: 136 mmol/L (ref 135–145)

## 2023-05-29 LAB — BRAIN NATRIURETIC PEPTIDE: B Natriuretic Peptide: 37.8 pg/mL (ref 0.0–100.0)

## 2023-05-29 NOTE — Patient Instructions (Addendum)
Thank you for coming in today  If you had labs drawn today, any labs that are abnormal the clinic will call you No news is good news  Medications: Take Torsemide 20 mg for 3 days 05/29/2023 05/29/2024 05/30/2024  Follow up appointments:  Your physician recommends that you schedule a follow-up appointment in:  3-4 months in clinic   Do the following things EVERYDAY: Weigh yourself in the morning before breakfast. Write it down and keep it in a log. Take your medicines as prescribed Eat low salt foods--Limit salt (sodium) to 2000 mg per day.  Stay as active as you can everyday Limit all fluids for the day to less than 2 liters   At the [facility], you and your health needs are our priority. As part of our continuing mission to provide you with exceptional heart care, we have created designated Provider Care Teams. These Care Teams include your primary Cardiologist (physician) and Advanced Practice Providers (APPs- Physician Assistants and Nurse Practitioners) who all work together to provide you with the care you need, when you need it.   You may see any of the following providers on your designated Care Team at your next follow up: Dr Arvilla Meres Dr Marca Ancona Dr. Marcos Eke, NP Robbie Lis, [place] [facility] [place], [place] Brynda Peon, NP Karle Plumber, PharmD   Please be sure to bring in all your medications bottles to every appointment.    Thank you for choosing [facility]  If you have any questions or concerns before your next appointment please send [place] a message through [place] or call our office at [phone].    TO LEAVE A MESSAGE FOR THE NURSE SELECT OPTION 2, PLEASE LEAVE A MESSAGE INCLUDING: YOUR NAME DATE OF BIRTH CALL BACK NUMBER REASON FOR CALL**this is important as we prioritize the call backs  YOU WILL RECEIVE A CALL BACK THE SAME DAY AS LONG AS YOU CALL BEFORE  4:00 PM

## 2023-06-06 ENCOUNTER — Encounter: Payer: Self-pay | Admitting: Internal Medicine

## 2023-06-21 ENCOUNTER — Telehealth: Payer: Self-pay

## 2023-06-21 ENCOUNTER — Ambulatory Visit: Payer: Managed Care, Other (non HMO) | Attending: Cardiology

## 2023-06-21 DIAGNOSIS — I5022 Chronic systolic (congestive) heart failure: Secondary | ICD-10-CM | POA: Diagnosis not present

## 2023-06-21 DIAGNOSIS — Z95810 Presence of automatic (implantable) cardiac defibrillator: Secondary | ICD-10-CM

## 2023-06-21 NOTE — Telephone Encounter (Signed)
 Spoke with patient and remote transmission received as requested.  See ICM note for results and update.

## 2023-06-21 NOTE — Progress Notes (Signed)
  Received: Today Sheryl Donna, NP  Alaynna Kerwood, Myrtie Atkinson, RN Thank you Avanell Bob, will have clinic staff reach out for diuretic increase.  Do you mind sending me another transmission please in ~ 2 weeks.

## 2023-06-21 NOTE — Progress Notes (Signed)
 EPIC Encounter for ICM Monitoring  Patient Name: Mitchell Jennings. is a 58 y.o. male Date: 06/21/2023 Primary Care Physican: Leesa Pulling, MD Primary Cardiologist: Mitzie Anda Electrophysiologist: Lawana Pray 06/21/2023 Weight: 409 lbs (baseline)       HF clinic fluid check.  Heart Failure questions reviewed.  Pt reports not feeling well this week and has had some confusion, disorientation and pounding headache.  He has an appointment today, 1/15 with PCP regarding symptoms.    He has been taking Torsemide  every other day as prescribed but did miss a dosage on 1/13 due to the symptoms listed above.    Optivol thoracic impedance suggesting possible fluid accumulation starting 12/31 and returned to baseline today, 1/15.   Prescribed:  Torsemide  20 mg take 1 tablet(s) (20 mg total) by mouth every other day. Potassium 20 mEq take 1 tablet(s) (20 mEq total) by mouth every other day. Spironolactone  25 mg take 1 tablet daily  Labs: 05/29/2023 Creatinine 1.81, BUN 18, Potassium 3.7, Sodium 136, GFR 44  03/15/2023 Creatinine 1.48, BUN 16, Potassium 4.1, Sodium 136, GFR 56  A complete set of results can be found in Results Review.  Recommendations:  Advised to continue Torsemide  every other day as prescribed.  Copy sent to Dennise Fitz, NP for review and recommendations if needed.  Explained ICM follow and he is agreeable for monthly fluid levels checks if HF clinic advises.    Follow-up plan: ICM clinic phone appointment pending recommendation from HF clinic.   91 day device clinic remote transmission 08/23/2023.    EP/Cardiology Office Visits: 08/16/2023 with Nevin Barcelona, PA.    Copy of ICM check sent to Dr. Lawana Pray.   3 month ICM trend: 06/18/2023.    12-14 Month ICM trend:     Almyra Jain, RN 06/21/2023 10:33 AM

## 2023-06-21 NOTE — Telephone Encounter (Signed)
 Attempted call to patient requesting manual remote transmission to check fluid levels following 12/23 HF clinic visit with Dennise Fitz, NP.  Left message with ICM number and advised monitor is currently showing as disconnected.  Requested to check home device monitor and send manual remote transmission.

## 2023-06-21 NOTE — Progress Notes (Signed)
 From: Sheryl Donna, NP  Sent: 06/21/2023  11:16 AM EST  To: Glorietta Lark, RN; Edris Gowers, CMA  Subject: Diuretic increase                              Reviewed Optivol today from remote transmission sent from [facility].   Please reach out to him and have him increase his Torsemide  to 20 mg daily for 1 week and KDUR to 20 mEq daily as well.   Needs BMET/BNP in ~10 days. Will ask for remote transmission again in about 2 weeks.   Following up with his PCP today regarding confusion. If not resolved by PCP recommend further eval in ED.

## 2023-06-23 ENCOUNTER — Other Ambulatory Visit: Payer: Self-pay

## 2023-06-23 DIAGNOSIS — Z95810 Presence of automatic (implantable) cardiac defibrillator: Secondary | ICD-10-CM

## 2023-06-23 DIAGNOSIS — I5022 Chronic systolic (congestive) heart failure: Secondary | ICD-10-CM

## 2023-06-23 NOTE — Progress Notes (Signed)
Spoke with patient and advised Mitchell Peon, NP at HF clinic recommended he take Torsemide daily x 1 week with Potassium 1 tablet daily x 1 week.  After the week, then return to every other day as prescribed.  He will need labs in 10 days, January 28th.  He agreed to have labs drawn at [facility] in [facility].  He verbalized understanding of instructions.  Encouraged to call back if condition changes.  Will recheck fluid levels on 1/28.  BMET and BNP order placed.

## 2023-06-23 NOTE — Addendum Note (Signed)
Addended by: Karie Soda on: 06/23/2023 01:14 PM   Modules accepted: Orders

## 2023-06-23 NOTE — Progress Notes (Addendum)
Attempted call to patient to provide recommendations by Brynda Peon, NP at HF clinic and unable to reach.  Left message for return call

## 2023-06-26 ENCOUNTER — Telehealth (HOSPITAL_COMMUNITY): Payer: Self-pay | Admitting: Cardiology

## 2023-06-26 NOTE — Telephone Encounter (Signed)
-----   Message from Alen Bleacher sent at 06/21/2023 11:11 AM EST ----- Regarding: Diuretic increase Reviewed Optivol today from remote transmission sent from [facility].   Please reach out to him and have him increase his Torsemide to 20 mg daily for 1 week and KDUR to 20 mEq daily as well.   Needs BMET/BNP in ~10 days. Will ask for remote transmission again in about 2 weeks.   Following up with his PCP today regarding confusion. If not resolved by PCP recommend further eval in ED.

## 2023-06-29 NOTE — Telephone Encounter (Signed)
Multiple attempts to contact pt Mitchell Jennings x 3

## 2023-06-29 NOTE — Telephone Encounter (Signed)
Randon Goldsmith, RN with device clinic spoke w/pt and advised him of recommendations per her documentation on 06/21/23

## 2023-07-03 NOTE — Progress Notes (Signed)
Remote ICD transmission.

## 2023-07-04 ENCOUNTER — Other Ambulatory Visit (HOSPITAL_COMMUNITY)
Admission: RE | Admit: 2023-07-04 | Discharge: 2023-07-04 | Disposition: A | Discharge status: Home / Self Care | Payer: Managed Care, Other (non HMO) | Source: Ambulatory Visit | Attending: Internal Medicine | Admitting: Internal Medicine

## 2023-07-04 ENCOUNTER — Ambulatory Visit: Payer: Managed Care, Other (non HMO) | Attending: Cardiology

## 2023-07-04 DIAGNOSIS — I5022 Chronic systolic (congestive) heart failure: Secondary | ICD-10-CM | POA: Insufficient documentation

## 2023-07-04 DIAGNOSIS — Z95810 Presence of automatic (implantable) cardiac defibrillator: Secondary | ICD-10-CM | POA: Insufficient documentation

## 2023-07-04 LAB — BASIC METABOLIC PANEL
Anion gap: 7 (ref 5–15)
BUN: 20 mg/dL (ref 6–20)
CO2: 27 mmol/L (ref 22–32)
Calcium: 8.9 mg/dL (ref 8.9–10.3)
Chloride: 105 mmol/L (ref 98–111)
Creatinine, Ser: 1.7 mg/dL — ABNORMAL HIGH (ref 0.61–1.24)
GFR, Estimated: 47 mL/min — ABNORMAL LOW (ref >60)
Glucose, Bld: 93 mg/dL (ref 70–99)
Potassium: 4.3 mmol/L (ref 3.5–5.1)
Sodium: 139 mmol/L (ref 135–145)

## 2023-07-04 LAB — BRAIN NATRIURETIC PEPTIDE: B Natriuretic Peptide: 59 pg/mL (ref 0.0–100.0)

## 2023-07-07 NOTE — Progress Notes (Signed)
EPIC Encounter for ICM Monitoring  Patient Name: Mitchell Jennings. is a 58 y.o. male Date: 07/07/2023 Primary Care Physican: [name], MD Primary Cardiologist: Shirlee Latch Electrophysiologist: Elberta Fortis 06/21/2023 Weight: 409 lbs (baseline)                                                            Transmission reviewed.    Optivol thoracic impedance suggesting fluid levels returned to normal after taking Torsemide and Potassium x 1 week.    Prescribed:  Torsemide 20 mg take 1 tablet(s) (20 mg total) by mouth every other day. Potassium 20 mEq take 1 tablet(s) (20 mEq total) by mouth every other day. Spironolactone 25 mg take 1 tablet daily   Labs: 05/29/2023 Creatinine 1.81, BUN 18, Potassium 3.7, Sodium 136, GFR 44  03/15/2023 Creatinine 1.48, BUN 16, Potassium 4.1, Sodium 136, GFR 56  A complete set of results can be found in Results Review.   Recommendations:  No changes.   Follow-up plan: No further ICM clinic phone appointment scheduled.   91 day device clinic remote transmission 08/23/2023.     EP/Cardiology Office Visits: 08/16/2023 with Yevette Edwards, PA.  09/27/2023 with HF clinic   Copy of ICM check sent to Dr. Elberta Fortis.   3 month ICM trend: 07/04/2023.    12-14 Month ICM trend:     Karie Soda, RN 07/07/2023 3:05 PM

## 2023-08-13 NOTE — Progress Notes (Unsigned)
 Cardiology Office Note:  .   Date:  08/13/2023  ID:  Mitchell Jennings., DOB 11/30/1965, MRN [id number] PCP: [name], MD  [facility] Providers Cardiologist:  None Electrophysiologist:  Will Jorja Loa, MD {  History of Present Illness: .   Mitchell Jennings. is a 58 y.o. male w/PMHx of  -- obesity with h/o bariatric surgery, OSA w/CPAP, CKD (III), DVT (both provoked and unprovoked)  -- NICM (suspect viral), chronic CHF (systolic), ICD  -- VT -- LBBB -- AFlutter  Saw Dr. Shirlee Latch 02/27/23, echo this day EF 20-25%, severe LV dilation, normal RV, IVC normal  Reported good exertional capacity GDMT further advanced  Saw Brandi 04/17/23, no recurrent VT (last in April 2024), in d/w Dr. Elberta Fortis BMI would need to get to or near 40 to consider attempt to CRT upgrade  Today's visit is scheduled as a 4 mo visit  ROS:   He is doing well Working on and having slow steady weight loss No CP, palpitations or cardiac awareness No SOB No near syncope or syncope  Home BPs much like today's here Infrequently/rarely does he have orthostatic dizziness  Device information MDT dual chamber ICD, implanted 01/17/18   Arrhythmia/AAD hx Admitted with VT storm Aug 2023, hypokalemic and missed BB dose AFlutter noted same admission via device with dual tachycardia Given absence of CP and relatively recent cath in 2019 showing no CAD, repeat cardiac cath was felt not indicated  Amiodarone started this admission  VT w/ATP April 2024, occurred during URI illness, amiodarone increased > 200mg  daily  Studies Reviewed: Marland Kitchen    EKG not done today  DEVICE interrogation done today and reviewed by myself Battery and lead measurements are good No arrhythmias  Echo (9/24): EF 20-25%, severe LV dilation, normal RV, IVC normal.   01/17/22: TTE 1. Left ventricular ejection fraction, by estimation, is 25 to 30%. The  left ventricle has severely decreased function. The left ventricle   demonstrates global hypokinesis. The left ventricular internal cavity size  was moderately dilated. Left  ventricular diastolic parameters are consistent with Grade II diastolic  dysfunction (pseudonormalization). Elevated left ventricular end-diastolic  pressure. The E/e' is 23.   2. Right ventricular systolic function is low normal. The right  ventricular size is normal.   3. The mitral valve is abnormal. Trivial mitral valve regurgitation.   4. The aortic valve was not well visualized. Aortic valve regurgitation  is not visualized.   5. The inferior vena cava is normal in size with greater than 50%  respiratory variability, suggesting right atrial pressure of 3 mmHg.   Comparison(s): Changes from prior study are noted. 04/08/2020: LVEF ~35%.    04/08/2020: TTE  1. Limited images. Suggest Definity contrast study for better assessment  of LV function (cardiac MRI would be another consideration if ICD is  compatible).   2. Left ventricular ejection fraction, by estimation, is approximately  35% based on very limited views. The left ventricle has moderately  decreased function. Left ventricular endocardial border not optimally  defined to evaluate regional wall motion. The   left ventricular internal cavity size was moderately to severely dilated.  Left ventricular diastolic parameters are indeterminate.   3. Normal RV-RA gradient of 17 mmHg. Right ventricular systolic function  is normal. The right ventricular size is normal. A device wire is  visualized.   4. The mitral valve is grossly normal. Trivial mitral valve  regurgitation.   5. The aortic valve is tricuspid. Aortic  valve regurgitation is not  visualized.   6. Unable to estimate CVP.    07/31/17: R/LHC Conclusions: No angiographically significant coronary artery disease, consistent with non-ischemic cardiomyopathy. Moderately elevated right heart filling pressures. Moderately to severely elevated left heart filling  pressures. Severe pulmonary hypertension. Low Fick cardiac output/index.   Risk Assessment/Calculations:    Physical Exam:   VS:  There were no vitals taken for this visit.   Wt Readings from Last 3 Encounters:  05/29/23 (!) 417 lb 12.8 oz (189.5 kg)  04/17/23 (!) 420 lb 6.4 oz (190.7 kg)  02/27/23 (!) 418 lb (189.6 kg)    GEN: Well nourished, well developed in no acute distress NECK: No JVD; No carotid bruits CARDIAC: RRR, no murmurs, rubs, gallops RESPIRATORY:   CTA b/l without rales, wheezing or rhonchi  ABDOMEN: Soft, non-tender, non-distended EXTREMITIES: No edema; No deformity   ICD site: is stable, no thinning, fluctuation, tethering  ASSESSMENT AND PLAN: .    ICD intact function no programming changes made  VT None further Chronic amiodarone Labs today  Paroxysmal Aflutter CHA2DS2Vasc is 3, on Xarelto, appropriately dosed zero % burden  NICM Chronic CHF No symptoms, exam findings of volume OL OptiVol looks good C/w Dr. Wynonia Musty  Secondary hypercoagulable state 2/2 AFib     Dispo: they would like to see Dr. Elberta Fortis next visit, plan to see him again in 4 mo or so, sooner if needed    Signed, [name], PA-C

## 2023-08-16 ENCOUNTER — Ambulatory Visit: Payer: Managed Care, Other (non HMO) | Attending: Cardiology | Admitting: Physician Assistant

## 2023-08-16 VITALS — BP 80/68 | HR 70 | Ht 74.0 in | Wt >= 6400 oz

## 2023-08-16 DIAGNOSIS — D6869 Other thrombophilia: Secondary | ICD-10-CM

## 2023-08-16 DIAGNOSIS — I472 Ventricular tachycardia, unspecified: Secondary | ICD-10-CM

## 2023-08-16 DIAGNOSIS — Z95810 Presence of automatic (implantable) cardiac defibrillator: Secondary | ICD-10-CM

## 2023-08-16 DIAGNOSIS — Z79899 Other long term (current) drug therapy: Secondary | ICD-10-CM

## 2023-08-16 DIAGNOSIS — I428 Other cardiomyopathies: Secondary | ICD-10-CM

## 2023-08-16 DIAGNOSIS — I4892 Unspecified atrial flutter: Secondary | ICD-10-CM | POA: Diagnosis not present

## 2023-08-16 DIAGNOSIS — I5022 Chronic systolic (congestive) heart failure: Secondary | ICD-10-CM

## 2023-08-16 LAB — CUP PACEART INCLINIC DEVICE CHECK
Battery Remaining Longevity: 30 mo
Battery Voltage: 2.94 V
Brady Statistic AP VP Percent: 0.03 %
Brady Statistic AP VS Percent: 60.46 %
Brady Statistic AS VP Percent: 0.01 %
Brady Statistic AS VS Percent: 39.49 %
Brady Statistic RA Percent Paced: 60.47 %
Brady Statistic RV Percent Paced: 0.05 %
Date Time Interrogation Session: 20250312170816
HighPow Impedance: 104 Ohm
Implantable Lead Connection Status: 753985
Implantable Lead Connection Status: 753985
Implantable Lead Implant Date: 20190814
Implantable Lead Implant Date: 20190814
Implantable Lead Location: 753859
Implantable Lead Location: 753860
Implantable Lead Model: 5076
Implantable Pulse Generator Implant Date: 20190814
Lead Channel Impedance Value: 380 Ohm
Lead Channel Impedance Value: 513 Ohm
Lead Channel Impedance Value: 532 Ohm
Lead Channel Pacing Threshold Amplitude: 0.625 V
Lead Channel Pacing Threshold Amplitude: 0.75 V
Lead Channel Pacing Threshold Pulse Width: 0.4 ms
Lead Channel Pacing Threshold Pulse Width: 0.4 ms
Lead Channel Sensing Intrinsic Amplitude: 15.625 mV
Lead Channel Sensing Intrinsic Amplitude: 17 mV
Lead Channel Sensing Intrinsic Amplitude: 3.375 mV
Lead Channel Sensing Intrinsic Amplitude: 5 mV
Lead Channel Setting Pacing Amplitude: 1.5 V
Lead Channel Setting Pacing Amplitude: 2.5 V
Lead Channel Setting Pacing Pulse Width: 0.4 ms
Lead Channel Setting Sensing Sensitivity: 0.3 mV
Zone Setting Status: 755011
Zone Setting Status: 755011

## 2023-08-16 NOTE — Patient Instructions (Signed)
 Medication Instructions:   Your physician recommends that you continue on your current medications as directed. Please refer to the Current Medication list given to you today.  *If you need a refill on your cardiac medications before your next appointment, please call your pharmacy*   Lab Work:   PLEASE GO DOWN STAIRS  [facility]  FIRST FLOOR  [address] ( GET OFF ELEVATORS MAKE A LEFT AND ANOTHER LEFT LAB ON RIGHT DOWN HALLWAY :  CMET AND TSH TODAY    If you have labs (blood work) drawn today and your tests are completely normal, you will receive your results only by: MyChart Message (if you have MyChart) OR A paper copy in the mail If you have any lab test that is abnormal or we need to change your treatment, we will call you to review the results.   Testing/Procedures: NONE ORDERED  TODAY    Follow-Up: At [facility], you and your health needs are our priority.  As part of our continuing mission to provide you with exceptional heart care, we have created designated Provider Care Teams.  These Care Teams include your primary Cardiologist (physician) and Advanced Practice Providers (APPs -  Physician Assistants and Nurse Practitioners) who all work together to provide you with the care you need, when you need it.  We recommend signing up for the patient portal called "MyChart".  Sign up information is provided on this After Visit Summary.  MyChart is used to connect with patients for Virtual Visits (Telemedicine).  Patients are able to view lab/test results, encounter notes, upcoming appointments, etc.  Non-urgent messages can be sent to your provider as well.   To learn more about what you can do with MyChart, go to [email or web address].    Your next appointment:    4 month(s) ( CONTACT  CASSIE HALL/ ANGELINE HAMMER FOR EP SCHEDULING ISSUES )  Provider:    Loman Brooklyn, MD  ONLY!!    Other Instructions    1st Floor: - Lobby - Registration  - Pharmacy  - Lab -  Cafe  2nd Floor: - PV Lab - Diagnostic Testing (echo, CT, nuclear med)  3rd Floor: - Vacant  4th Floor: - TCTS (cardiothoracic surgery) - AFib Clinic - Structural Heart Clinic - Vascular Surgery  - Vascular Ultrasound  5th Floor: - HeartCare Cardiology (general and EP) - Clinical Pharmacy for coumadin, hypertension, lipid, weight-loss medications, and med management appointments    Valet parking services will be available as well.

## 2023-08-17 LAB — COMPREHENSIVE METABOLIC PANEL
ALT: 17 IU/L (ref 0–44)
AST: 21 IU/L (ref 0–40)
Albumin: 4.4 g/dL (ref 3.8–4.9)
Alkaline Phosphatase: 101 IU/L (ref 44–121)
BUN/Creatinine Ratio: 12 (ref 9–20)
BUN: 22 mg/dL (ref 6–24)
Bilirubin Total: 0.7 mg/dL (ref 0.0–1.2)
CO2: 25 mmol/L (ref 20–29)
Calcium: 8.9 mg/dL (ref 8.7–10.2)
Chloride: 99 mmol/L (ref 96–106)
Creatinine, Ser: 1.91 mg/dL — ABNORMAL HIGH (ref 0.76–1.27)
Globulin, Total: 3 g/dL (ref 1.5–4.5)
Glucose: 94 mg/dL (ref 70–99)
Potassium: 4.4 mmol/L (ref 3.5–5.2)
Sodium: 141 mmol/L (ref 134–144)
Total Protein: 7.4 g/dL (ref 6.0–8.5)
eGFR: 41 mL/min/{1.73_m2} — ABNORMAL LOW (ref >59)

## 2023-08-17 LAB — TSH: TSH: 0.663 u[IU]/mL (ref 0.450–4.500)

## 2023-08-18 ENCOUNTER — Other Ambulatory Visit: Payer: Self-pay | Admitting: *Deleted

## 2023-08-18 DIAGNOSIS — Z79899 Other long term (current) drug therapy: Secondary | ICD-10-CM

## 2023-08-23 ENCOUNTER — Ambulatory Visit (INDEPENDENT_AMBULATORY_CARE_PROVIDER_SITE_OTHER): Payer: Self-pay

## 2023-08-23 DIAGNOSIS — I428 Other cardiomyopathies: Secondary | ICD-10-CM

## 2023-08-24 LAB — CUP PACEART REMOTE DEVICE CHECK
Battery Remaining Longevity: 31 mo
Battery Voltage: 2.94 V
Brady Statistic AP VP Percent: 0.02 %
Brady Statistic AP VS Percent: 58.89 %
Brady Statistic AS VP Percent: 0.01 %
Brady Statistic AS VS Percent: 41.07 %
Brady Statistic RA Percent Paced: 58.91 %
Brady Statistic RV Percent Paced: 0.04 %
Date Time Interrogation Session: 20250319044222
HighPow Impedance: 95 Ohm
Implantable Lead Connection Status: 753985
Implantable Lead Connection Status: 753985
Implantable Lead Implant Date: 20190814
Implantable Lead Implant Date: 20190814
Implantable Lead Location: 753859
Implantable Lead Location: 753860
Implantable Lead Model: 5076
Implantable Pulse Generator Implant Date: 20190814
Lead Channel Impedance Value: 380 Ohm
Lead Channel Impedance Value: 494 Ohm
Lead Channel Impedance Value: 494 Ohm
Lead Channel Pacing Threshold Amplitude: 0.75 V
Lead Channel Pacing Threshold Amplitude: 0.75 V
Lead Channel Pacing Threshold Pulse Width: 0.4 ms
Lead Channel Pacing Threshold Pulse Width: 0.4 ms
Lead Channel Sensing Intrinsic Amplitude: 15.25 mV
Lead Channel Sensing Intrinsic Amplitude: 15.25 mV
Lead Channel Sensing Intrinsic Amplitude: 3.5 mV
Lead Channel Sensing Intrinsic Amplitude: 3.5 mV
Lead Channel Setting Pacing Amplitude: 1.5 V
Lead Channel Setting Pacing Amplitude: 2.5 V
Lead Channel Setting Pacing Pulse Width: 0.4 ms
Lead Channel Setting Sensing Sensitivity: 0.3 mV
Zone Setting Status: 755011
Zone Setting Status: 755011

## 2023-09-12 ENCOUNTER — Telehealth: Payer: Self-pay | Admitting: Cardiology

## 2023-09-12 NOTE — Telephone Encounter (Signed)
 Spoke with pt and who complains of some mild dizziness this morning when moving around.  He denies current CP or SOB.  He states he had a migraine HA yesterday which resolved with Tylenol and sleep.  His BP machine is not currently working so he is unable to check BP and HR.  He is taking medications as prescribed and states he has sent a device transmission and would like for it to be reviewed. Pt advised will forward to device clinic.  Reviewed ED precautions.  Pt verbalizes understanding and agrees with current plan.

## 2023-09-12 NOTE — Telephone Encounter (Signed)
 STAT if patient feels like he/she is going to faint   1. Are you feeling dizzy, lightheaded, or faint right now? No     2. Have you passed out?  No  (If yes move to .[facility])   3. Do you have any other symptoms? Fatigue    4. Have you checked your HR and BP (record if available)?  Bp machine broke, wife is going out to get him a new one.   He states he sent a manual reading to device and wanted someone to check it. Please advise.

## 2023-09-12 NOTE — Telephone Encounter (Signed)
 Device transmission reviewed: Normal Device function. No abnormality correlating with symptoms.  Only slight variations in thoracic impedence (he denies s/s of fluid retention, no SOB), patient has heart failure clinic appointment on 4/23.   Patient notified.  He thinks he over did it in the yard this weekend.  Feeling better, planning to take it easy today.

## 2023-09-13 ENCOUNTER — Other Ambulatory Visit (HOSPITAL_COMMUNITY)
Admission: RE | Admit: 2023-09-13 | Discharge: 2023-09-13 | Disposition: A | Discharge status: Home / Self Care | Attending: Physician Assistant | Admitting: Physician Assistant

## 2023-09-13 DIAGNOSIS — Z79899 Other long term (current) drug therapy: Secondary | ICD-10-CM | POA: Diagnosis present

## 2023-09-13 LAB — BASIC METABOLIC PANEL WITH GFR
Anion gap: 8 (ref 5–15)
BUN: 16 mg/dL (ref 6–20)
CO2: 26 mmol/L (ref 22–32)
Calcium: 8.9 mg/dL (ref 8.9–10.3)
Chloride: 104 mmol/L (ref 98–111)
Creatinine, Ser: 1.66 mg/dL — ABNORMAL HIGH (ref 0.61–1.24)
GFR, Estimated: 48 mL/min — ABNORMAL LOW (ref >60)
Glucose, Bld: 86 mg/dL (ref 70–99)
Potassium: 4 mmol/L (ref 3.5–5.1)
Sodium: 138 mmol/L (ref 135–145)

## 2023-09-26 NOTE — Progress Notes (Signed)
 ADVANCED HF CLINIC NOTE  PCP: Dr. Bearl Limes Cardiology: Dr. Amanda Jungling HF Cardiology: Dr. Mitzie Anda  CC: Heart failure follow up  58 y.o. with history of nonischemic cardiomyopathy was referred by Dr. Amanda Jungling for evaluation of CHF.  Patient had no cardiac history prior to 1/19.  Starting 11/18, he began to develop profound exertional dyspnea.  This happened after a flu-like illness.  This was gradually progressive, and he ended up at [facility] in 1/19.  Echo showed EF 20-25% with some RV failure.  He was diuresed extensively and sent to followup with Dr. Amanda Jungling.  In 2/19, he had a right heart cath showing no significant coronary disease but elevated left and right-sided filling pressures and pulmonary venous hypertension were noted on RHC.  Cardiac index was low.    Echo in 7/19 showed EF 30% with diffuse hypokinesis and normal RV.  He had Medtronic ICD placed in 8/19.  Echo in 11/21 showed EF 35%, moderate-severe LV dilation, normal RV.   Admitted 8/13-8/16/23 after ICD shock at home, in the setting of hypokalemia.  ICD fired x3. Admitted with VT storm. Device interrogation confirmed appropriate shock x 3 for VT, ATP x 1 --> ? A fib. CXR ok. Optivol not suggestive of fluid overload. Started on amiodarone  drip. EP consulted. Xarelto  started given evidence of Afib on device interrogation. Echo showed EF 25-30% (35% in past). Given absence of CP and relatively recent cath in 2019 showing no CAD, repeat cardiac cath was felt not indicated. Recent admit earlier this month for VT storm and ICD shocks, in setting of hypokalemia. Spironolactone  dose ultimately increased to 50 mg daily. Discharged home on PO amio.   Readmitted 01/26/22 for AKI and hypotension, presumed dehydration. GDMT held then eventually restarted at lower dose. Discharged home, weight 422 lbs.  Echo 9/24 EF 20-25%, severe LV dilation, normal RV, IVC normal.   Seen by Dr. Lawana Pray 11/24 for evaluation for upgrade of device for CRT. Needs  to loose weight prior to being eligible for CRT upgrade  Today he returns for HF follow up with wife. Overall feeling fine. Felt poorly last week with dizziness and fatigue. Saw PCP and BP was 92/50. Home BP typically 100/65-70s. Had GI illness during this time with diarrhea. Feels more SOB walking up 2 flights at work. Denies palpitations, abnormal bleeding, CP, dizziness, edema, or PND/Orthopnea. Appetite ok. No fever or chills. Weight at home 409 pounds. Taking all medications. Wears CPAP. PCP started him on Prozac, hasn't started yet as he wanted to run by us  first.  Medtronic device interrogation (personally reviewed):  OptiVol creeping up, thoracic impedence down, 1.3 hr/day, activity, no AF or VT  ECG (personally reviewed): A paced LBBB QRS 160 msec, QTc 516 msec  PMH: 1. Obesity: h/o bariatric surgery (gastric sleeve).  2. OSA: Uses CPAP.  3. CKD: stage 3.  4. AAA: Abdominal US  in 3/19 showed 3.4 cm AAA.  5. Chronic systolic CHF: Nonischemic cardiomyopathy.  First noted in 1/19.  - Echo (1/19, [place]): EF 20-25%, moderate to severe LV dilation, moderate to severe RV dilation with moderately decreased RV systolic function, mild to moderate MR, PASP 57 mmHg.  - RHC/LHC (1/19): No significant CAD.  Mean RA 12, PA 80/30 mean 47, mean PCWP 35, CI 1.7, PVR 2.4 WU.  - CPX (4/19): peak VO2 16.7, VE/VCO2 slope 26, RER 1.14 - Echo (7/19): EF 30% with diffuse hypokinesis, moderate diastolic dysfunction, normal RV size and systolic function.  - Medtronic ICD - Echo (11/21):  EF 35%, moderate-severe LV dilation, normal RV. - Echo (8/23): EF 25-30% (35% prior study), RV low normal. IVC not dilated - Echo (9/24): EF 20-25%, severe LV dilation, normal RV, IVC normal.  6. DVT right leg 7. Gout 8. Atrial fibrillation: Paroxysmal.  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   FH: Father with MI, uncle with CABG, uncle with MI, mother with MI and CHF.   SH: Married, lives in [place], works for a trucking company, no  smoking, no ETOH/drugs.   ROS: All systems reviewed and negative except as per HPI.   Current Outpatient Medications  Medication Sig Dispense Refill   allopurinol  (ZYLOPRIM ) 300 MG tablet Take 300 mg by mouth daily.     amiodarone  (PACERONE ) 200 MG tablet Take 200 mg by mouth daily.     dapagliflozin  propanediol (FARXIGA ) 10 MG TABS tablet Take 1 tablet (10 mg total) by mouth daily before breakfast. 30 tablet 4   metoprolol  succinate (TOPROL -XL) 100 MG 24 hr tablet TAKE 1 TABLET BY MOUTH TWICE A DAY WITH FOOD, OR IMMEDIATELY FOLLOWING A MEAL 180 tablet 3   Multiple Vitamins-Minerals (CENTRUM MEN) TABS Take 1 tablet by mouth daily.     OZEMPIC , 1 MG/DOSE, 4 MG/3ML SOPN Inject 2 mg into the skin once a week. On Sundays     Potassium Chloride  ER 20 MEQ TBCR TAKE 1 TABLET BY MOUTH EVERY OTHER DAY 45 tablet 2   rivaroxaban  (XARELTO ) 20 MG TABS tablet Take 1 tablet (20 mg total) by mouth daily with supper. 30 tablet 11   rosuvastatin  (CRESTOR ) 5 MG tablet TAKE 1 TABLET BY MOUTH EVERY DAY 90 tablet 2   sacubitril -valsartan  (ENTRESTO ) 49-51 MG Take 1 tablet by mouth 2 (two) times daily. 60 tablet 11   spironolactone  (ALDACTONE ) 25 MG tablet Take 1 tablet (25 mg total) by mouth daily. 90 tablet 3   torsemide  (DEMADEX ) 20 MG tablet Take 1 tablet (20 mg total) by mouth every other day. 45 tablet 3   No current facility-administered medications for this encounter.   Wt Readings from Last 3 Encounters:  09/27/23 (!) 188.1 kg (414 lb 9.6 oz)  08/16/23 (!) 185.5 kg (409 lb)  05/29/23 (!) 189.5 kg (417 lb 12.8 oz)   BP 130/74   Pulse 69   Wt (!) 188.1 kg (414 lb 9.6 oz)   SpO2 100%   BMI 53.23 kg/m   Physical Exam:  General:  NAD. No resp difficulty, walked into clinic HEENT: Normal Neck: Supple. No JVD. Thick neck Cor: Regular rate & rhythm. No rubs, gallops or murmurs. Lungs: Clear Abdomen: Soft, obese, nontender, nondistended.  Extremities: No cyanosis, clubbing, rash, trace BLE edema +  varicosities  Neuro: Alert & oriented x 3, moves all 4 extremities w/o difficulty. Affect pleasant.  Assessment/Plan 1. Chronic Systolic Heart Failure: NICM. Has Medtronic ICD. Echo 9/24 EF 20-25%, severe LV dilation, normal RV, IVC normal.  - Chronically NYHA class II, confounded by super morbid obesity and deconditioning. Mildly volume overloaded on exam and Optivol.   - Increase torsemide  to 20 mg daily x 3-5 days, then back to 20 mg every other day. Labs reviewed from 09/13/23 and are stable. Repeat BMET in 10-14 days. - Increase KCL to 20 daily x 3-5 days, then back to 20 every other day. - Continue Entresto  49/51 bid (did not tolerate higher dose) - Continue Toprol  XL 100 mg bid. - Continue Farxiga  10 mg daily.   - Continue spironolactone  25 mg daily. - Seen by Dr. Lawana Pray  for evaluation for upgrade of device for CRT. Needs to lose weight prior to being eligible for CRT upgrade - Will ask Device RN to send transmission in 1 week to follow OptiVol. 2. H/o VT: in setting of NICM, has ICD. In 8/23, he had VT storm + ICD shocks x 3, in setting of hypokalemia. Denies palpitations. No further ICD shocks or episodes of VT on device interrogation. - Continue amiodarone  200 mg daily.  Recent LFTs and TSH stable.  Will need regular eye exam,   - Follows with EP.  3. Stage III CKD: Continue SGLT2i. Most recent SCr 1.66. 4. PAF: None on device interrogation - Continue Xarelto . No bleeding issues. 5. H/o LE DVT: On Xarelto . 6. OSA: Compliant with CPAP. 7. Obesity: Body mass index is 53.23 kg/m.   - Continue semaglutide , down about 40 lbs.  8. Anxiety: PCP starting on fluoxetine. OK to start from CV standpoint. ECG obtained today for QTc monitoring. Will have him come back in 2 weeks to repeat to ensure QTc stable after starting med.  Follow up in 3-4 months with Dr. Mitzie Anda.  Arlice Bene [facility] FNP-BC 09/27/23

## 2023-09-27 ENCOUNTER — Ambulatory Visit (HOSPITAL_COMMUNITY)
Admission: RE | Admit: 2023-09-27 | Discharge: 2023-09-27 | Disposition: A | Discharge status: Home / Self Care | Payer: Managed Care, Other (non HMO) | Source: Ambulatory Visit | Attending: Family Medicine | Admitting: Family Medicine

## 2023-09-27 ENCOUNTER — Encounter (HOSPITAL_COMMUNITY): Payer: Self-pay

## 2023-09-27 VITALS — BP 130/74 | HR 69 | Wt >= 6400 oz

## 2023-09-27 DIAGNOSIS — Z6841 Body Mass Index (BMI) 40.0 and over, adult: Secondary | ICD-10-CM | POA: Diagnosis not present

## 2023-09-27 DIAGNOSIS — F419 Anxiety disorder, unspecified: Secondary | ICD-10-CM | POA: Insufficient documentation

## 2023-09-27 DIAGNOSIS — I5022 Chronic systolic (congestive) heart failure: Secondary | ICD-10-CM | POA: Diagnosis not present

## 2023-09-27 DIAGNOSIS — Z86718 Personal history of other venous thrombosis and embolism: Secondary | ICD-10-CM | POA: Insufficient documentation

## 2023-09-27 DIAGNOSIS — I472 Ventricular tachycardia, unspecified: Secondary | ICD-10-CM

## 2023-09-27 DIAGNOSIS — I5020 Unspecified systolic (congestive) heart failure: Secondary | ICD-10-CM

## 2023-09-27 DIAGNOSIS — Z7901 Long term (current) use of anticoagulants: Secondary | ICD-10-CM | POA: Diagnosis not present

## 2023-09-27 DIAGNOSIS — Z79899 Other long term (current) drug therapy: Secondary | ICD-10-CM | POA: Diagnosis not present

## 2023-09-27 DIAGNOSIS — I480 Paroxysmal atrial fibrillation: Secondary | ICD-10-CM | POA: Insufficient documentation

## 2023-09-27 DIAGNOSIS — Z7984 Long term (current) use of oral hypoglycemic drugs: Secondary | ICD-10-CM | POA: Diagnosis not present

## 2023-09-27 DIAGNOSIS — I428 Other cardiomyopathies: Secondary | ICD-10-CM | POA: Insufficient documentation

## 2023-09-27 DIAGNOSIS — N183 Chronic kidney disease, stage 3 unspecified: Secondary | ICD-10-CM | POA: Insufficient documentation

## 2023-09-27 DIAGNOSIS — G4733 Obstructive sleep apnea (adult) (pediatric): Secondary | ICD-10-CM | POA: Diagnosis not present

## 2023-09-27 DIAGNOSIS — I447 Left bundle-branch block, unspecified: Secondary | ICD-10-CM | POA: Insufficient documentation

## 2023-09-27 DIAGNOSIS — I272 Pulmonary hypertension, unspecified: Secondary | ICD-10-CM | POA: Insufficient documentation

## 2023-09-27 DIAGNOSIS — Z95810 Presence of automatic (implantable) cardiac defibrillator: Secondary | ICD-10-CM | POA: Insufficient documentation

## 2023-09-27 DIAGNOSIS — I4891 Unspecified atrial fibrillation: Secondary | ICD-10-CM

## 2023-09-27 NOTE — Patient Instructions (Addendum)
 Thank you for coming in today  If you had labs drawn today, any labs that are abnormal the clinic will call you No news is good news  Medications: Increase Torsemide  to 20 mg daily for 3-5 days Increase Potassium to 20 meq daily for 3-5 days then back to every other day  Follow up appointments:  Your physician recommends that you schedule a follow-up appointment in:  4 months With Dr. Mitzie Anda Please call our office to schedule the follow-up appointment in June 2025 for August 2025.  2 weeks for EKG and labs   Do the following things EVERYDAY: Weigh yourself in the morning before breakfast. Write it down and keep it in a log. Take your medicines as prescribed Eat low salt foods--Limit salt (sodium) to 2000 mg per day.  Stay as active as you can everyday Limit all fluids for the day to less than 2 liters   At the [facility], you and your health needs are our priority. As part of our continuing mission to provide you with exceptional heart care, we have created designated Provider Care Teams. These Care Teams include your primary Cardiologist (physician) and Advanced Practice Providers (APPs- Physician Assistants and Nurse Practitioners) who all work together to provide you with the care you need, when you need it.   You may see any of the following providers on your designated Care Team at your next follow up: Dr Jules Oar Dr Peder Bourdon Dr. Mimi Alt, NP Ruddy Corral, [place] [facility] [place], [place] Dennise Fitz, NP Luster Salters, PharmD   Please be sure to bring in all your medications bottles to every appointment.    Thank you for choosing [facility]  If you have any questions or concerns before your next appointment please send us  a message through [place] or call our office at [phone].    TO LEAVE A MESSAGE FOR THE NURSE SELECT OPTION 2, PLEASE LEAVE A MESSAGE  INCLUDING: YOUR NAME DATE OF BIRTH CALL BACK NUMBER REASON FOR CALL**this is important as we prioritize the call backs  YOU WILL RECEIVE A CALL BACK THE SAME DAY AS LONG AS YOU CALL BEFORE 4:00 PM

## 2023-10-04 ENCOUNTER — Ambulatory Visit: Attending: Cardiology

## 2023-10-04 ENCOUNTER — Telehealth: Payer: Self-pay

## 2023-10-04 DIAGNOSIS — Z95810 Presence of automatic (implantable) cardiac defibrillator: Secondary | ICD-10-CM | POA: Diagnosis not present

## 2023-10-04 DIAGNOSIS — I5022 Chronic systolic (congestive) heart failure: Secondary | ICD-10-CM

## 2023-10-04 NOTE — Progress Notes (Signed)
 Attempted call to patient to provide Mitchell Good NP at HF clinic recommendations to continue Torsemide  20 mg 1 tablet daily and Potassium 20 mEq take 1 tablet daily.  Left number for call back.

## 2023-10-04 NOTE — Progress Notes (Signed)
 EPIC Encounter for ICM Monitoring  Patient Name: Mitchell Jennings. is a 58 y.o. male Date: 10/04/2023 Primary Care Physican: Leesa Pulling, MD Primary Cardiologist: Mitzie Anda Electrophysiologist: Lawana Pray 06/21/2023 Weight: 409 lbs (baseline) 09/27/2023 Office Weight: 414 lbs                                                           Fluid level check for HF clinic as requested.  Attempted call to patient and unable to reach.   Transmission results reviewed. Transmission reviewed.    Optivol thoracic impedance suggesting fluid levels returned to normal after recommendation from Vernia Good, NP at HF clinic to increase torsemide  to 20 mg daily x 3-5 days and Potassium.    Prescribed:  Torsemide  20 mg take 1 tablet(s) (20 mg total) by mouth every other day. Potassium 20 mEq take 1 tablet(s) (20 mEq total) by mouth every other day. Spironolactone  25 mg take 1 tablet daily   Labs: 09/13/2023 Creatinine 1.66, BUN 16, Potassium 4.0, Sodium 138  08/16/2023 Creatinine 1.91, BUN 22, Potassium 4.4, Sodium 141, GFR 41  A complete set of results can be found in Results Review.   Recommendations:  Unable to reach.  Copy sent to Vernia Good, NP at [facility] clinic as requested following 4/23 OV.   Follow-up plan: No further ICM clinic phone appointment scheduled.   91 day device clinic remote transmission 11/22/2023.     EP/Cardiology Office Visits: 11/28/2023 with Dr Lawana Pray.     Copy of ICM check sent to Dr. Lawana Pray.   3 month ICM trend: 10/04/2023.    12-14 Month ICM trend:     Almyra Jain, RN 10/04/2023 7:20 AM

## 2023-10-04 NOTE — Progress Notes (Signed)
  Received: Today [place], Arlice Bene, FNP  Neill Jurewicz, Myrtie Atkinson, RN Please keep torsemide  at 20 mg daily and KCL at 20 daily. Thanks!

## 2023-10-04 NOTE — Telephone Encounter (Signed)
 Remote ICM transmission received.  Attempted call to patient regarding ICM remote transmission and no answer.

## 2023-10-04 NOTE — Progress Notes (Signed)
 Attempted call to patient to provide Vernia Good NP at HF clinic recommendations to continue Torsemide  20 mg 1 tablet daily and Potassium 20 mEq take 1 tablet daily.  Left number for call back.

## 2023-10-06 ENCOUNTER — Telehealth (HOSPITAL_COMMUNITY): Payer: Self-pay | Admitting: Cardiology

## 2023-10-06 NOTE — Progress Notes (Signed)
 Remote ICD transmission.

## 2023-10-06 NOTE — Telephone Encounter (Signed)
 Pt returned call and aware of instruction to continue torsemide  20 daily with kcl 20 daily

## 2023-10-06 NOTE — Addendum Note (Signed)
 Addended by: Lott Rouleau A on: 10/06/2023 11:55 AM   Modules accepted: Orders

## 2023-10-06 NOTE — Telephone Encounter (Signed)
 ATTEMPTED TO CONTACT PT REGARDING TRANSMISSION READING NO ANSWER -LMOM

## 2023-10-06 NOTE — Progress Notes (Signed)
 Attempted call to patient to provide Vernia Good NP at HF clinic recommendations to continue Torsemide  20 mg 1 tablet daily and Potassium 20 mEq take 1 tablet daily.   Left number for call back.  Routed message to Vernia Good, NP to inform unable to reach patient to provide her recommendations.

## 2023-10-10 ENCOUNTER — Encounter: Payer: Self-pay | Admitting: Cardiology

## 2023-10-11 ENCOUNTER — Other Ambulatory Visit: Payer: Self-pay

## 2023-10-11 ENCOUNTER — Ambulatory Visit (HOSPITAL_COMMUNITY)
Admission: RE | Admit: 2023-10-11 | Discharge: 2023-10-11 | Disposition: A | Discharge status: Home / Self Care | Source: Ambulatory Visit | Attending: Internal Medicine | Admitting: Internal Medicine

## 2023-10-11 VITALS — BP 104/68 | HR 70

## 2023-10-11 DIAGNOSIS — I480 Paroxysmal atrial fibrillation: Secondary | ICD-10-CM | POA: Diagnosis present

## 2023-10-11 DIAGNOSIS — I5020 Unspecified systolic (congestive) heart failure: Secondary | ICD-10-CM | POA: Insufficient documentation

## 2023-10-11 DIAGNOSIS — I447 Left bundle-branch block, unspecified: Secondary | ICD-10-CM | POA: Insufficient documentation

## 2023-10-11 LAB — BASIC METABOLIC PANEL WITH GFR
Anion gap: 9 (ref 5–15)
BUN: 25 mg/dL — ABNORMAL HIGH (ref 6–20)
CO2: 29 mmol/L (ref 22–32)
Calcium: 9.1 mg/dL (ref 8.9–10.3)
Chloride: 101 mmol/L (ref 98–111)
Creatinine, Ser: 2.03 mg/dL — ABNORMAL HIGH (ref 0.61–1.24)
GFR, Estimated: 38 mL/min — ABNORMAL LOW (ref >60)
Glucose, Bld: 109 mg/dL — ABNORMAL HIGH (ref 70–99)
Potassium: 4 mmol/L (ref 3.5–5.1)
Sodium: 139 mmol/L (ref 135–145)

## 2023-10-11 NOTE — Addendum Note (Signed)
 Encounter addended by: Edris Gowers, CMA on: 10/11/2023 2:14 PM  Actions taken: Vitals modified

## 2023-10-11 NOTE — Progress Notes (Signed)
 Pt presents to office for nurse visit-repeat EKG as ordered by Murry Art Re:PCP starting on fluoxetine. OK to start from CV standpoint. ECG obtained today for QTc monitoring. Will have him come back in 2 weeks to repeat to ensure QTc stable after starting med.     EKG Obtained and reviewed by Barry Lies EKG stable no changes

## 2023-10-13 ENCOUNTER — Encounter (HOSPITAL_COMMUNITY): Payer: Self-pay

## 2023-10-18 ENCOUNTER — Ambulatory Visit (HOSPITAL_COMMUNITY): Payer: Self-pay

## 2023-10-18 DIAGNOSIS — I5022 Chronic systolic (congestive) heart failure: Secondary | ICD-10-CM

## 2023-10-18 NOTE — Telephone Encounter (Addendum)
 Spoke to patient aware of medication changes. Labs to be done @ [facility]  ----- Message from Elmarie Hacking sent at 10/11/2023  4:34 PM EDT ----- Renal function elevated on daily torsemide , drop back to torsemide  20 mg every other day with 20 KCL every other day.   Repeat BMET in 10-14 days

## 2023-11-01 ENCOUNTER — Ambulatory Visit (HOSPITAL_COMMUNITY): Payer: Self-pay | Admitting: Family Medicine

## 2023-11-01 ENCOUNTER — Other Ambulatory Visit (HOSPITAL_COMMUNITY)
Admission: RE | Admit: 2023-11-01 | Discharge: 2023-11-01 | Disposition: A | Discharge status: Home / Self Care | Source: Ambulatory Visit | Attending: Family Medicine | Admitting: Family Medicine

## 2023-11-01 DIAGNOSIS — I5022 Chronic systolic (congestive) heart failure: Secondary | ICD-10-CM | POA: Diagnosis present

## 2023-11-01 LAB — BASIC METABOLIC PANEL WITH GFR
Anion gap: 9 (ref 5–15)
BUN: 21 mg/dL — ABNORMAL HIGH (ref 6–20)
CO2: 27 mmol/L (ref 22–32)
Calcium: 8.7 mg/dL — ABNORMAL LOW (ref 8.9–10.3)
Chloride: 101 mmol/L (ref 98–111)
Creatinine, Ser: 1.69 mg/dL — ABNORMAL HIGH (ref 0.61–1.24)
GFR, Estimated: 47 mL/min — ABNORMAL LOW (ref >60)
Glucose, Bld: 96 mg/dL (ref 70–99)
Potassium: 3.9 mmol/L (ref 3.5–5.1)
Sodium: 137 mmol/L (ref 135–145)

## 2023-11-22 ENCOUNTER — Ambulatory Visit (INDEPENDENT_AMBULATORY_CARE_PROVIDER_SITE_OTHER): Payer: Self-pay

## 2023-11-22 DIAGNOSIS — I472 Ventricular tachycardia, unspecified: Secondary | ICD-10-CM

## 2023-11-23 ENCOUNTER — Ambulatory Visit: Payer: Self-pay | Admitting: Cardiology

## 2023-11-23 LAB — CUP PACEART REMOTE DEVICE CHECK
Battery Remaining Longevity: 30 mo
Battery Voltage: 2.94 V
Brady Statistic AP VP Percent: 0.05 %
Brady Statistic AP VS Percent: 78.98 %
Brady Statistic AS VP Percent: 0.01 %
Brady Statistic AS VS Percent: 20.96 %
Brady Statistic RA Percent Paced: 79.01 %
Brady Statistic RV Percent Paced: 0.06 %
Date Time Interrogation Session: 20250618043823
HighPow Impedance: 98 Ohm
Implantable Lead Connection Status: 753985
Implantable Lead Connection Status: 753985
Implantable Lead Implant Date: 20190814
Implantable Lead Implant Date: 20190814
Implantable Lead Location: 753859
Implantable Lead Location: 753860
Implantable Lead Model: 5076
Implantable Pulse Generator Implant Date: 20190814
Lead Channel Impedance Value: 380 Ohm
Lead Channel Impedance Value: 456 Ohm
Lead Channel Impedance Value: 494 Ohm
Lead Channel Pacing Threshold Amplitude: 0.75 V
Lead Channel Pacing Threshold Amplitude: 0.875 V
Lead Channel Pacing Threshold Pulse Width: 0.4 ms
Lead Channel Pacing Threshold Pulse Width: 0.4 ms
Lead Channel Sensing Intrinsic Amplitude: 17.375 mV
Lead Channel Sensing Intrinsic Amplitude: 17.375 mV
Lead Channel Sensing Intrinsic Amplitude: 4.25 mV
Lead Channel Sensing Intrinsic Amplitude: 4.25 mV
Lead Channel Setting Pacing Amplitude: 1.75 V
Lead Channel Setting Pacing Amplitude: 2.5 V
Lead Channel Setting Pacing Pulse Width: 0.4 ms
Lead Channel Setting Sensing Sensitivity: 0.3 mV
Zone Setting Status: 755011
Zone Setting Status: 755011

## 2023-11-28 ENCOUNTER — Encounter: Payer: Self-pay | Admitting: Cardiology

## 2023-11-28 ENCOUNTER — Ambulatory Visit: Attending: Cardiology | Admitting: Cardiology

## 2023-11-28 VITALS — BP 116/70 | HR 70 | Ht 74.0 in | Wt >= 6400 oz

## 2023-11-28 DIAGNOSIS — G4733 Obstructive sleep apnea (adult) (pediatric): Secondary | ICD-10-CM

## 2023-11-28 DIAGNOSIS — I5022 Chronic systolic (congestive) heart failure: Secondary | ICD-10-CM

## 2023-11-28 DIAGNOSIS — Z79899 Other long term (current) drug therapy: Secondary | ICD-10-CM

## 2023-11-28 DIAGNOSIS — I472 Ventricular tachycardia, unspecified: Secondary | ICD-10-CM

## 2023-11-28 NOTE — Progress Notes (Signed)
  Electrophysiology Office Note:   Date:  11/28/2023  ID:  Mitchell LELON Tish Mickey., DOB 28-Aug-1965, MRN [id number]  Primary Cardiologist: None Primary Heart Failure: None Electrophysiologist: Chyla Schlender Gladis Norton, MD      History of Present Illness:   Mitchell Nielson. is a 58 y.o. male with h/o chronic systolic heart failure due to nonischemic cardiomyopathy, obesity post gastric sleeve, sleep apnea on CPAP, CKD stage III seen today for routine electrophysiology followup.   Since last being seen in our clinic the patient reports doing well.  He has no complaints.  He does get short of breath when he exerts himself, but otherwise has no acute complaints at this time.  he denies chest pain, palpitations, dyspnea, PND, orthopnea, nausea, vomiting, dizziness, syncope, edema, weight gain, or early satiety.   Review of systems complete and found to be negative unless listed in HPI.      EP Information / Studies Reviewed:    EKG is not ordered today. EKG from 10/11/23 reviewed which showed atrial paced, atypical left bundle branch block      ICD Interrogation-  reviewed in detail today,  See PACEART report.  Device History: Medtronic Dual Chamber ICD implanted 01/17/2018 for chronic systolic heart failure History of appropriate therapy: Yes History of AAD therapy: Yes; currently on amiodarone    Risk Assessment/Calculations:            Physical Exam:   VS:  There were no vitals taken for this visit.   Wt Readings from Last 3 Encounters:  09/27/23 (!) 414 lb 9.6 oz (188.1 kg)  08/16/23 (!) 409 lb (185.5 kg)  05/29/23 (!) 417 lb 12.8 oz (189.5 kg)     GEN: Well nourished, well developed in no acute distress NECK: No JVD; No carotid bruits CARDIAC: Regular rate and rhythm, no murmurs, rubs, gallops RESPIRATORY:  Clear to auscultation without rales, wheezing or rhonchi  ABDOMEN: Soft, non-tender, non-distended EXTREMITIES:  No edema; No deformity   ASSESSMENT AND PLAN:    Chronic  systolic dysfunction s/p Medtronic dual chamber ICD  euvolemic today Stable on an appropriate medical regimen Normal ICD function Sensing, threshold, impedance within normal limits Programming appropriate See [facility] Art report No changes today He has a left bundle branch block.  He would potentially benefit from CRT-D upgrade.  For now, we Mitchell Jennings hold off and allow him to lose weight.  He is on Ozempic .  Mitchell Jennings discuss it at the time of his generator change.  2.  Ventricular tachycardia: On amiodarone .  No further episodes.  Delrae Hagey continue with current management.  3.  High risk medication monitoring: On amiodarone  for VT.  Intervals stable.  LFTs and TSH within normal limits  4.  Obstructive sleep apnea: CPAP compliance encouraged  5.  Morbid obesity: Post gastric sleeve  Disposition:   Follow up with EP APP in 12 months   Signed, Honorio Devol Gladis Norton, MD

## 2023-12-22 ENCOUNTER — Other Ambulatory Visit (HOSPITAL_COMMUNITY): Payer: Self-pay | Admitting: Family Medicine

## 2023-12-22 ENCOUNTER — Other Ambulatory Visit (HOSPITAL_COMMUNITY): Payer: Self-pay | Admitting: Cardiology

## 2024-01-16 ENCOUNTER — Telehealth (HOSPITAL_COMMUNITY): Payer: Self-pay | Admitting: Cardiology

## 2024-01-16 NOTE — Telephone Encounter (Signed)
 Patient called to report episode of extreme weakness while on the way to work, turned around and went home, wife reports he is very lethargic.  Reports heavy breathing at the time of incident, however no SOB  Denies dizziness, or CP Denies syncope Denies increase in activity over the past 24 hours Reports medication compliance  Weight stable at 407  B/p 125/78  Reports he did send device transmission   Wanted to notify provider, should he come in for appt nothing like this has happened and this event is concerning

## 2024-01-16 NOTE — Telephone Encounter (Signed)
 That's correct, no episodes on transmission. Normal device function.

## 2024-01-16 NOTE — Telephone Encounter (Addendum)
 Requested by Heart Failure clinic to review 8/12 Carelink report this AM.   No optivol alerts but will forward to device clinic triage to review report for any abnormalities.  Optivol thoracic impedance suggesting possible fluid accumulation 8/8-8/10.     Impedance starting to trend above baseline 8/11 which may be suggesting dryness.

## 2024-01-16 NOTE — Telephone Encounter (Signed)
 Patient aware and voiced understanding

## 2024-01-20 ENCOUNTER — Other Ambulatory Visit (HOSPITAL_COMMUNITY): Payer: Self-pay | Admitting: Cardiology

## 2024-01-20 DIAGNOSIS — I5020 Unspecified systolic (congestive) heart failure: Secondary | ICD-10-CM

## 2024-02-01 NOTE — Addendum Note (Signed)
 Addended by: VICCI SELLER A on: 02/01/2024 09:02 AM   Modules accepted: Orders

## 2024-02-01 NOTE — Progress Notes (Signed)
 Remote ICD transmission.

## 2024-02-18 ENCOUNTER — Other Ambulatory Visit (HOSPITAL_COMMUNITY): Payer: Self-pay | Admitting: Physician Assistant

## 2024-02-21 ENCOUNTER — Ambulatory Visit (INDEPENDENT_AMBULATORY_CARE_PROVIDER_SITE_OTHER): Payer: Self-pay

## 2024-02-21 DIAGNOSIS — I472 Ventricular tachycardia, unspecified: Secondary | ICD-10-CM

## 2024-02-22 LAB — CUP PACEART REMOTE DEVICE CHECK
Battery Remaining Longevity: 27 mo
Battery Voltage: 2.94 V
Brady Statistic AP VP Percent: 0.04 %
Brady Statistic AP VS Percent: 73.71 %
Brady Statistic AS VP Percent: 0.01 %
Brady Statistic AS VS Percent: 26.24 %
Brady Statistic RA Percent Paced: 73.74 %
Brady Statistic RV Percent Paced: 0.05 %
Date Time Interrogation Session: 20250917022704
HighPow Impedance: 98 Ohm
Implantable Lead Connection Status: 753985
Implantable Lead Connection Status: 753985
Implantable Lead Implant Date: 20190814
Implantable Lead Implant Date: 20190814
Implantable Lead Location: 753859
Implantable Lead Location: 753860
Implantable Lead Model: 5076
Implantable Pulse Generator Implant Date: 20190814
Lead Channel Impedance Value: 342 Ohm
Lead Channel Impedance Value: 494 Ohm
Lead Channel Impedance Value: 494 Ohm
Lead Channel Pacing Threshold Amplitude: 0.625 V
Lead Channel Pacing Threshold Amplitude: 0.875 V
Lead Channel Pacing Threshold Pulse Width: 0.4 ms
Lead Channel Pacing Threshold Pulse Width: 0.4 ms
Lead Channel Sensing Intrinsic Amplitude: 15.75 mV
Lead Channel Sensing Intrinsic Amplitude: 15.75 mV
Lead Channel Sensing Intrinsic Amplitude: 3.25 mV
Lead Channel Sensing Intrinsic Amplitude: 3.25 mV
Lead Channel Setting Pacing Amplitude: 1.75 V
Lead Channel Setting Pacing Amplitude: 2.5 V
Lead Channel Setting Pacing Pulse Width: 0.4 ms
Lead Channel Setting Sensing Sensitivity: 0.3 mV
Zone Setting Status: 755011
Zone Setting Status: 755011

## 2024-02-26 ENCOUNTER — Ambulatory Visit: Payer: Self-pay | Admitting: Cardiology

## 2024-02-26 NOTE — Progress Notes (Signed)
Remote ICD Transmission.

## 2024-04-10 ENCOUNTER — Other Ambulatory Visit (HOSPITAL_COMMUNITY): Payer: Self-pay | Admitting: Family Medicine

## 2024-04-10 DIAGNOSIS — I5022 Chronic systolic (congestive) heart failure: Secondary | ICD-10-CM

## 2024-05-07 ENCOUNTER — Telehealth: Payer: Self-pay | Admitting: Cardiology

## 2024-05-07 NOTE — Telephone Encounter (Signed)
 Transmission reviewed:  Normal device function. Presenting rhythm: AP/VS (NSR).  Heart failure diagnostics are currently WNL.  No arrhythmias.

## 2024-05-07 NOTE — Telephone Encounter (Signed)
 Spoke with pt and his wife who report dizziness, weakness and DOE since yesterday morning.  Pt states his weight was up 3 pounds over the past couple of days so he took an extra Torsemide  20mg  yesterday.  Pt denies current CP.  Pt has been taking medications as prescribed.  See Also BP readings below. Pt advised per device clinic review transmission shows no alerts and Optivol WNL.  Reviewed ED precautions with pt and wife.  Pt's wife reports pt is scheduled to see his PCP, Dr Toribio this afternoon for further evaluation and pt will plan to keep that appointment.  Pt verbalizes understanding and agrees with current plan.

## 2024-05-07 NOTE — Telephone Encounter (Signed)
 Pts wife states he has not been feeling well. He has been dizzy and weak. She states they have sent a transmission to the device clinic and ask they look at it. They believe he may be retaining fluid.     STAT if BP is GREATER than 180/120 TODAY.  STAT if BP is LESS than 90/60 and SYMPTOMATIC TODAY  1. What is your BP concern? High readings   2. Have you taken any BP medication today? No   3. What are your last 5 BP readings?116/72 around 6am, 118/80 at 9am   4. Are you having any other symptoms (ex. Dizziness, headache, blurred vision, passed out)? Dizziness and weakness    If swelling, where is the swelling located? Slight swelling in abdomen    How much weight have you gained and in what time span? About three pounds in two days   Have you gained 2 pounds in a day or 5 pounds in a week?  Not sure   Do you have a log of your daily weights (if so, list)? No   Are you currently taking a fluid pill? Yes doubled up on lasix  yesterday   Are you currently SOB? Yes, developed today   Have you traveled recently in a car or plane for an extended period of time?

## 2024-05-08 ENCOUNTER — Telehealth: Payer: Self-pay | Admitting: Cardiology

## 2024-05-08 NOTE — Telephone Encounter (Signed)
 Per patients MyChart message requesting an appointment - Need to discuss my meds been having some dizziness and fatigue   STAT if patient feels like he/she is going to faint   Are you dizzy, lightheaded, or faint now? Yes  Have you passed out? no  Do you have any other symptoms? fatigue  Have you checked your HR and BP (record if available)? BP was 117/89 and pulse was 75

## 2024-05-08 NOTE — Telephone Encounter (Signed)
Left message and call back number.

## 2024-05-09 NOTE — Telephone Encounter (Signed)
 Left message for pt to call.

## 2024-05-14 NOTE — Telephone Encounter (Signed)
 Sent patient a MyChart message to call back regarding his concerns.

## 2024-05-22 ENCOUNTER — Ambulatory Visit: Payer: Self-pay

## 2024-05-22 DIAGNOSIS — I5022 Chronic systolic (congestive) heart failure: Secondary | ICD-10-CM | POA: Diagnosis not present

## 2024-05-23 ENCOUNTER — Ambulatory Visit: Payer: Self-pay | Admitting: Cardiology

## 2024-05-23 LAB — CUP PACEART REMOTE DEVICE CHECK
Battery Remaining Longevity: 24 mo
Battery Voltage: 2.93 V
Brady Statistic AP VP Percent: 0.03 %
Brady Statistic AP VS Percent: 79.02 %
Brady Statistic AS VP Percent: 0.02 %
Brady Statistic AS VS Percent: 20.94 %
Brady Statistic RA Percent Paced: 79.04 %
Brady Statistic RV Percent Paced: 0.04 %
Date Time Interrogation Session: 20251217012404
HighPow Impedance: 99 Ohm
Implantable Lead Connection Status: 753985
Implantable Lead Connection Status: 753985
Implantable Lead Implant Date: 20190814
Implantable Lead Implant Date: 20190814
Implantable Lead Location: 753859
Implantable Lead Location: 753860
Implantable Lead Model: 5076
Implantable Pulse Generator Implant Date: 20190814
Lead Channel Impedance Value: 380 Ohm
Lead Channel Impedance Value: 513 Ohm
Lead Channel Impedance Value: 513 Ohm
Lead Channel Pacing Threshold Amplitude: 0.625 V
Lead Channel Pacing Threshold Amplitude: 0.75 V
Lead Channel Pacing Threshold Pulse Width: 0.4 ms
Lead Channel Pacing Threshold Pulse Width: 0.4 ms
Lead Channel Sensing Intrinsic Amplitude: 16.625 mV
Lead Channel Sensing Intrinsic Amplitude: 16.625 mV
Lead Channel Sensing Intrinsic Amplitude: 3.25 mV
Lead Channel Sensing Intrinsic Amplitude: 3.25 mV
Lead Channel Setting Pacing Amplitude: 1.75 V
Lead Channel Setting Pacing Amplitude: 2.5 V
Lead Channel Setting Pacing Pulse Width: 0.4 ms
Lead Channel Setting Sensing Sensitivity: 0.3 mV
Zone Setting Status: 755011
Zone Setting Status: 755011

## 2024-05-23 NOTE — Progress Notes (Signed)
 Remote ICD Transmission

## 2024-07-05 ENCOUNTER — Other Ambulatory Visit (HOSPITAL_COMMUNITY): Payer: Self-pay | Admitting: Family Medicine

## 2024-07-05 DIAGNOSIS — I5022 Chronic systolic (congestive) heart failure: Secondary | ICD-10-CM
